# Patient Record
Sex: Female | Born: 1935
Health system: Southern US, Community
[De-identification: ages and names within clinical notes are randomized; demographics above are authoritative.]

## PROBLEM LIST (undated history)

## (undated) DIAGNOSIS — M810 Age-related osteoporosis without current pathological fracture: Secondary | ICD-10-CM

## (undated) DIAGNOSIS — K219 Gastro-esophageal reflux disease without esophagitis: Secondary | ICD-10-CM

## (undated) DIAGNOSIS — E039 Hypothyroidism, unspecified: Secondary | ICD-10-CM

## (undated) DIAGNOSIS — C801 Malignant (primary) neoplasm, unspecified: Secondary | ICD-10-CM

## (undated) DIAGNOSIS — M353 Polymyalgia rheumatica: Secondary | ICD-10-CM

## (undated) DIAGNOSIS — M1990 Unspecified osteoarthritis, unspecified site: Secondary | ICD-10-CM

## (undated) DIAGNOSIS — G4730 Sleep apnea, unspecified: Secondary | ICD-10-CM

## (undated) DIAGNOSIS — I10 Essential (primary) hypertension: Secondary | ICD-10-CM

## (undated) DIAGNOSIS — E079 Disorder of thyroid, unspecified: Secondary | ICD-10-CM

## (undated) HISTORY — PX: TUBAL LIGATION: SHX77

## (undated) HISTORY — PX: LAMINECTOMY: SHX219

## (undated) HISTORY — PX: TONSILLECTOMY: SUR1361

## (undated) HISTORY — PX: DILATION AND CURETTAGE OF UTERUS: SHX78

---

## 1998-01-17 ENCOUNTER — Other Ambulatory Visit: Admission: RE | Admit: 1998-01-17 | Discharge: 1998-01-17 | Payer: Self-pay | Admitting: *Deleted

## 1999-02-18 ENCOUNTER — Encounter: Admission: RE | Admit: 1999-02-18 | Discharge: 1999-02-18 | Payer: Self-pay | Admitting: Internal Medicine

## 1999-03-06 ENCOUNTER — Encounter: Payer: Self-pay | Admitting: Internal Medicine

## 1999-03-06 ENCOUNTER — Encounter: Admission: RE | Admit: 1999-03-06 | Discharge: 1999-03-06 | Payer: Self-pay | Admitting: Internal Medicine

## 1999-03-18 ENCOUNTER — Ambulatory Visit (HOSPITAL_COMMUNITY): Admission: RE | Admit: 1999-03-18 | Discharge: 1999-03-18 | Payer: Self-pay | Admitting: Internal Medicine

## 1999-03-19 ENCOUNTER — Encounter: Payer: Self-pay | Admitting: Internal Medicine

## 1999-04-01 ENCOUNTER — Ambulatory Visit (HOSPITAL_COMMUNITY): Admission: RE | Admit: 1999-04-01 | Discharge: 1999-04-01 | Payer: Self-pay | Admitting: Internal Medicine

## 1999-04-01 ENCOUNTER — Encounter (INDEPENDENT_AMBULATORY_CARE_PROVIDER_SITE_OTHER): Payer: Self-pay | Admitting: *Deleted

## 1999-04-12 ENCOUNTER — Encounter: Payer: Self-pay | Admitting: Internal Medicine

## 1999-04-12 ENCOUNTER — Encounter: Admission: RE | Admit: 1999-04-12 | Discharge: 1999-04-12 | Payer: Self-pay | Admitting: Internal Medicine

## 1999-05-13 HISTORY — PX: THYROIDECTOMY: SHX17

## 1999-05-22 ENCOUNTER — Encounter: Payer: Self-pay | Admitting: Surgery

## 1999-05-23 ENCOUNTER — Encounter (INDEPENDENT_AMBULATORY_CARE_PROVIDER_SITE_OTHER): Payer: Self-pay

## 1999-05-24 ENCOUNTER — Inpatient Hospital Stay (HOSPITAL_COMMUNITY): Admission: AD | Admit: 1999-05-24 | Discharge: 1999-05-25 | Payer: Self-pay | Admitting: Surgery

## 1999-08-12 ENCOUNTER — Ambulatory Visit (HOSPITAL_COMMUNITY): Admission: RE | Admit: 1999-08-12 | Discharge: 1999-08-12 | Payer: Self-pay | Admitting: Endocrinology

## 1999-08-16 ENCOUNTER — Encounter: Payer: Self-pay | Admitting: Endocrinology

## 1999-08-19 ENCOUNTER — Ambulatory Visit: Admission: RE | Admit: 1999-08-19 | Discharge: 1999-08-19 | Payer: Self-pay | Admitting: Endocrinology

## 1999-08-19 ENCOUNTER — Encounter: Payer: Self-pay | Admitting: Endocrinology

## 2000-02-20 ENCOUNTER — Encounter: Payer: Self-pay | Admitting: Internal Medicine

## 2000-02-20 ENCOUNTER — Encounter: Admission: RE | Admit: 2000-02-20 | Discharge: 2000-02-20 | Payer: Self-pay | Admitting: Internal Medicine

## 2001-02-17 ENCOUNTER — Other Ambulatory Visit: Admission: RE | Admit: 2001-02-17 | Discharge: 2001-02-17 | Payer: Self-pay | Admitting: *Deleted

## 2001-02-22 ENCOUNTER — Encounter: Payer: Self-pay | Admitting: Internal Medicine

## 2001-02-22 ENCOUNTER — Encounter: Admission: RE | Admit: 2001-02-22 | Discharge: 2001-02-22 | Payer: Self-pay | Admitting: Internal Medicine

## 2001-05-14 ENCOUNTER — Encounter: Payer: Self-pay | Admitting: Internal Medicine

## 2001-05-14 ENCOUNTER — Encounter: Admission: RE | Admit: 2001-05-14 | Discharge: 2001-05-14 | Payer: Self-pay | Admitting: Internal Medicine

## 2002-02-24 ENCOUNTER — Encounter: Payer: Self-pay | Admitting: Internal Medicine

## 2002-02-24 ENCOUNTER — Encounter: Admission: RE | Admit: 2002-02-24 | Discharge: 2002-02-24 | Payer: Self-pay | Admitting: Internal Medicine

## 2002-09-15 ENCOUNTER — Other Ambulatory Visit: Admission: RE | Admit: 2002-09-15 | Discharge: 2002-09-15 | Payer: Self-pay | Admitting: Obstetrics and Gynecology

## 2003-03-21 ENCOUNTER — Encounter: Admission: RE | Admit: 2003-03-21 | Discharge: 2003-03-21 | Payer: Self-pay | Admitting: Internal Medicine

## 2003-04-26 ENCOUNTER — Encounter: Admission: RE | Admit: 2003-04-26 | Discharge: 2003-04-26 | Payer: Self-pay | Admitting: Internal Medicine

## 2004-03-25 ENCOUNTER — Encounter (HOSPITAL_COMMUNITY): Admission: RE | Admit: 2004-03-25 | Discharge: 2004-06-23 | Payer: Self-pay | Admitting: Endocrinology

## 2004-04-09 ENCOUNTER — Encounter: Admission: RE | Admit: 2004-04-09 | Discharge: 2004-04-09 | Payer: Self-pay | Admitting: Internal Medicine

## 2004-10-04 ENCOUNTER — Encounter: Admission: RE | Admit: 2004-10-04 | Discharge: 2004-10-04 | Payer: Self-pay | Admitting: Neurology

## 2005-01-10 ENCOUNTER — Encounter: Admission: RE | Admit: 2005-01-10 | Discharge: 2005-01-10 | Payer: Self-pay | Admitting: Neurology

## 2005-01-17 ENCOUNTER — Encounter: Admission: RE | Admit: 2005-01-17 | Discharge: 2005-01-17 | Payer: Self-pay | Admitting: Neurology

## 2005-01-31 ENCOUNTER — Encounter: Admission: RE | Admit: 2005-01-31 | Discharge: 2005-01-31 | Payer: Self-pay | Admitting: Neurology

## 2005-04-10 ENCOUNTER — Encounter: Admission: RE | Admit: 2005-04-10 | Discharge: 2005-04-10 | Payer: Self-pay | Admitting: Internal Medicine

## 2005-05-12 HISTORY — PX: EYE SURGERY: SHX253

## 2006-04-13 ENCOUNTER — Encounter: Admission: RE | Admit: 2006-04-13 | Discharge: 2006-04-13 | Payer: Self-pay | Admitting: Internal Medicine

## 2007-03-01 ENCOUNTER — Encounter: Admission: RE | Admit: 2007-03-01 | Discharge: 2007-03-01 | Payer: Self-pay | Admitting: Internal Medicine

## 2007-04-15 ENCOUNTER — Encounter: Admission: RE | Admit: 2007-04-15 | Discharge: 2007-04-15 | Payer: Self-pay | Admitting: Internal Medicine

## 2007-04-27 ENCOUNTER — Encounter: Admission: RE | Admit: 2007-04-27 | Discharge: 2007-04-27 | Payer: Self-pay | Admitting: Internal Medicine

## 2008-04-18 ENCOUNTER — Encounter: Admission: RE | Admit: 2008-04-18 | Discharge: 2008-04-18 | Payer: Self-pay | Admitting: Internal Medicine

## 2009-03-02 ENCOUNTER — Encounter: Admission: RE | Admit: 2009-03-02 | Discharge: 2009-03-02 | Payer: Self-pay | Admitting: Internal Medicine

## 2009-04-24 ENCOUNTER — Encounter: Admission: RE | Admit: 2009-04-24 | Discharge: 2009-04-24 | Payer: Self-pay | Admitting: Internal Medicine

## 2010-04-29 ENCOUNTER — Encounter
Admission: RE | Admit: 2010-04-29 | Discharge: 2010-04-29 | Payer: Self-pay | Source: Home / Self Care | Attending: Internal Medicine | Admitting: Internal Medicine

## 2010-09-27 NOTE — Op Note (Signed)
. [facility]  Patient:    Lorraine Brandt                        MRN: [id number] Proc. Date: 05/23/99 Adm. Date:  [id number] Attending:  Charlton Haws                           Operative Report  PREOPERATIVE DIAGNOSIS:  Postoperative wound hematoma.  POSTOPERATIVE DIAGNOSIS:  Postoperative wound hematoma.  OPERATION:  Exploration of wound with evacuation of hematoma.  SURGEON:  Currie Paris, M.D.  ASSISTANT:  Abigail Miyamoto, M.D.  ANESTHESIA:  General endotracheal.  CLINICAL HISTORY:  This patient is a 77 year old who underwent a left thyroid lobectomy earlier today for a ______ neoplasm of the thyroid.  The patient did well through surgery and was initially stable postoperatively, having been seen on postoperative rounds by Dr. Magnus Ivan about 2:30 with no problems. Approximately 4:15, she had a coughing spell and then noted throat tightness and some bleeding from the incision with significant neck swelling.  She felt a little dyspneic, nd it was felt that she was developing a wound hematoma.  She was therefore brought back to the operating room for emergent wound exploration.  DESCRIPTION OF PROCEDURE:  The patient was brought to the operating room directly and satisfactory general endotracheal anesthesia was obtained.  The neck was prepped and draped as a sterile field.  The skin staples had been removed, but he sutures on the platysma were then divided.  There was a small amount of blood in the subcutaneous subplatysmal pocket, and this was evacuated.  This area was irrigated copiously, and there was nothing bleeding.  There was, however, some bulging from beneath the straps.  The closure of the midline straps was removed, and there was about a 20 cc clot in the pocket left from the thyroidectomy on the left side.  This was evacuated and copiously irrigated.  There was one small oozing point on the left side near  the midline.  This was just slightly oozing, and it was cauterized.  I irrigated again, and I saw nothing else actively bleeding.  I placed some Surgicel in the wound and a pack and waited 10 minutes by the clock.  The packing and Surgicel were removed.  There was virtually no blood on the Surgicel other than where it had been directly in contact with some blood left behind prior to the irrigation.  Again, the wound was copiously irrigated and observed for approximately five minutes by the clock.  There was no active bleeding noted.  smaller piece of Surgicel was left lying along the raw surfaces, and we watched it for another five minutes to be sure that there was no bleeding.  During this period of time, I made a small incision in the midline, below the lower flap, and I placed a 10 mm JP drain through this.  Once everything appeared to be dry, the drain was placed, and the strap muscles were re-closed using 3-0 Vicryl.  The subcutaneous pocket was, again, checked for hemostasis, and again, it appeared to be dry. It was closed, again, with 3-0 Vicryl.  The skin was closed with a few staples and  then some Steri-Strips.  The drain was secured with a 3-0 nylon.  The patient tolerated the procedure well.  Estimated blood loss was approximately 35 cc including the evacuation of the hematoma.  No  complications during the procedure, and she tolerated it well.  All counts were correct. DD:  05/23/99 TD:  05/23/99 Job: [id number]

## 2010-09-27 NOTE — Discharge Summary (Signed)
[place]  Patient:    Lorraine Brandt                        MRN: [id number] Adm. Date:  [id number] Disc. Date: [id number] Attending:  Charlton Haws                           Discharge Summary  FINAL DIAGNOSIS:  Well-differentiated papillary adenocarcinoma, follicular variant, left thyroid lobe with associated chronic lymphocytic thyroiditis.  CLINICAL HISTORY:  This patient is a 77 year old who had a solid left thyroid nodule and aspiration suggested this was not a goiter but a follicular nodule. She was therefore scheduled for a thyroid lobectomy.  HOSPITAL COURSE:  The patient was admitted and taken to the operating room where a left thyroid lobectomy was accomplished.  Frozen section showed this to be a follicular lesion but could not be determined whether it was malignant or benign. She tolerated the procedure well.  On initial postoperative visit, she seemed to be doing fine but shortly thereafter had a significant coughing episode, developed  increased respiratory difficulty with some bleeding on the dressing and swelling of the neck.  She was returned to the operating room, where a wound hematoma was found and evacuated and a drain left.  She tolerated that nicely and that evening was  doing fine.  The drain was left in for two days and she was then discharged. She felt well.  She was tolerating a diet.  Her voice seemed to be okay and she was  doing quite well.  Her drain was removed prior to discharge.  DISPOSITION:  She was discharged in satisfactory condition.  ACTIVITIES:  Limited activities.  DIET:  Usual diet.  FOLLOW-UP:  To be followed in my office in several days.  LABORATORY DATA:  At the time of discharge, her pathology was still pending. This subsequently returned showing well-differentiated papillary adenocarcinoma, follicular variant. DD:  06/20/99 TD:  06/20/99 Job: [id number]

## 2010-09-27 NOTE — Op Note (Signed)
[place]  Patient:    Lorraine Brandt                        MRN: [id number] Proc. Date: 05/23/99 Adm. Date:  [id number] Attending:  Charlton Haws CC:         Modesta Messing, M.D.                           Operative Report  Account [id number]  PREOPERATIVE DIAGNOSIS:  Left thyroid nodule.  POSTOPERATIVE DIAGNOSIS:  Left thyroid nodule - follicular lesion.  OPERATION:  Left thyroid lobectomy.  SURGEON:  Currie Paris, M.D.  ASSISTANT:  Anselm Pancoast. Zachery Dakins, M.D.  ANESTHESIA:  General endotracheal.  CLINICAL HISTORY:  This patient is a 77 year old recently found on routine physical to have a left thyroid lobe nodule and on biopsy had some follicular cells suggestive of follicular neoplasm.  DESCRIPTION OF PROCEDURE:  The patient was brought to the operating room and after satisfactory general endotracheal anesthesia having been obtained, the neck was  prepped and draped.  A curvilinear incision was outlined about one fingerbreadth above the clavicle and made deep in through the platysma.  Supple platysmal flaps were raised.  Self-retaining retractors were placed and the midline fascia opened. The thyroid was identified and was fairly small, but there was a nodular density in the left lobe.  The left lobe was retracted medially and the nerve identified.  Once that had been identified, divided, and the vessels to the superior pole ligating doubly with 2-0 silk.  Then I divided the vessels to the inferior pole  staying very close into the surgical plane of the thyroid.  Both the superior and inferior parathyroids were identified and preserved by staying on the thyroid side and peeling them off of the thyroid and dropping them back.  As the thyroid was  rotated further medially, I was able to divide the remaining attachments and then at this point decided to divide the midline.  I made a small elevation of the  thyroid off of the trachea and divided it between clamps and ligated with 3-0 Vicryl pop-offs.  The remaining attachments of the thyroid near where the nerve entered were divided carefully taking care to stay  away from the nerve.  The thyroid was sent for frozen section which subsequently returned follicular tumor.  There was some question of some papillary variant, ut no diagnosis of papillary variant and follicular neoplasm could be made, so I elected to terminate the case.  The wound was checked for hemostasis and then closed with 3-0 Vicryl to close the midline, 3-0 Vicryl in the platysma, and staples and Steri-Strips on the skin.  The patient tolerated the procedure well.  There were no intraoperative complications.  All counts were correct.DD:  05/23/99 TD:  05/23/99 Job: [id number]

## 2011-01-29 ENCOUNTER — Other Ambulatory Visit: Payer: Self-pay | Admitting: Internal Medicine

## 2011-01-29 DIAGNOSIS — Z1231 Encounter for screening mammogram for malignant neoplasm of breast: Secondary | ICD-10-CM

## 2011-03-05 ENCOUNTER — Ambulatory Visit
Admission: RE | Admit: 2011-03-05 | Discharge: 2011-03-05 | Disposition: A | Discharge status: Home / Self Care | Payer: Medicare Other | Source: Ambulatory Visit | Attending: Internal Medicine | Admitting: Internal Medicine

## 2011-05-01 ENCOUNTER — Ambulatory Visit
Admission: RE | Admit: 2011-05-01 | Discharge: 2011-05-01 | Disposition: A | Discharge status: Home / Self Care | Payer: Medicare Other | Source: Ambulatory Visit | Attending: Internal Medicine | Admitting: Internal Medicine

## 2011-05-01 DIAGNOSIS — Z1231 Encounter for screening mammogram for malignant neoplasm of breast: Secondary | ICD-10-CM

## 2011-06-23 ENCOUNTER — Emergency Department (INDEPENDENT_AMBULATORY_CARE_PROVIDER_SITE_OTHER): Payer: Medicare Other

## 2011-06-23 ENCOUNTER — Emergency Department (HOSPITAL_BASED_OUTPATIENT_CLINIC_OR_DEPARTMENT_OTHER)
Admission: EM | Admit: 2011-06-23 | Discharge: 2011-06-23 | Disposition: A | Discharge status: Home / Self Care | Payer: Medicare Other | Attending: Emergency Medicine | Admitting: Emergency Medicine

## 2011-06-23 ENCOUNTER — Other Ambulatory Visit: Payer: Self-pay

## 2011-06-23 ENCOUNTER — Encounter (HOSPITAL_BASED_OUTPATIENT_CLINIC_OR_DEPARTMENT_OTHER): Payer: Self-pay | Admitting: Family Medicine

## 2011-06-23 DIAGNOSIS — E871 Hypo-osmolality and hyponatremia: Secondary | ICD-10-CM | POA: Diagnosis not present

## 2011-06-23 DIAGNOSIS — I10 Essential (primary) hypertension: Secondary | ICD-10-CM | POA: Diagnosis not present

## 2011-06-23 DIAGNOSIS — R55 Syncope and collapse: Secondary | ICD-10-CM

## 2011-06-23 DIAGNOSIS — J449 Chronic obstructive pulmonary disease, unspecified: Secondary | ICD-10-CM

## 2011-06-23 DIAGNOSIS — J4489 Other specified chronic obstructive pulmonary disease: Secondary | ICD-10-CM

## 2011-06-23 DIAGNOSIS — E079 Disorder of thyroid, unspecified: Secondary | ICD-10-CM | POA: Insufficient documentation

## 2011-06-23 HISTORY — DX: Disorder of thyroid, unspecified: E07.9

## 2011-06-23 HISTORY — DX: Essential (primary) hypertension: I10

## 2011-06-23 HISTORY — DX: Polymyalgia rheumatica: M35.3

## 2011-06-23 HISTORY — DX: Unspecified osteoarthritis, unspecified site: M19.90

## 2011-06-23 LAB — CBC
HCT: 39.7 % (ref 36.0–46.0)
MCH: 32.3 pg (ref 26.0–34.0)
MCHC: 36.3 g/dL — ABNORMAL HIGH (ref 30.0–36.0)
MCV: 89 fL (ref 78.0–100.0)
RDW: 12.9 % (ref 11.5–15.5)

## 2011-06-23 LAB — DIFFERENTIAL
Basophils Absolute: 0 10*3/uL (ref 0.0–0.1)
Basophils Relative: 0 % (ref 0–1)
Eosinophils Absolute: 0.2 10*3/uL (ref 0.0–0.7)
Eosinophils Relative: 2 % (ref 0–5)
Monocytes Absolute: 0.8 10*3/uL (ref 0.1–1.0)

## 2011-06-23 LAB — BASIC METABOLIC PANEL
CO2: 27 mEq/L (ref 19–32)
Calcium: 9.6 mg/dL (ref 8.4–10.5)
Creatinine, Ser: 0.6 mg/dL (ref 0.50–1.10)
GFR calc Af Amer: >90 mL/min (ref >90)
GFR calc non Af Amer: 87 mL/min — ABNORMAL LOW (ref >90)

## 2011-06-23 LAB — URINALYSIS, ROUTINE W REFLEX MICROSCOPIC
Glucose, UA: NEGATIVE mg/dL
Ketones, ur: NEGATIVE mg/dL
Leukocytes, UA: NEGATIVE
Protein, ur: NEGATIVE mg/dL

## 2011-06-23 MED ORDER — SODIUM CHLORIDE 0.9 % IV BOLUS (SEPSIS)
500.0000 mL | Freq: Once | INTRAVENOUS | Status: AC
  Administered 2011-06-23: 500 mL via INTRAVENOUS

## 2011-06-23 NOTE — ED Provider Notes (Signed)
History     CSN: [id number]  Arrival date & time 06/23/11  1010   First MD Initiated Contact with Patient 06/23/11 1021      Chief Complaint  Patient presents with  . Loss of Consciousness    (Consider location/radiation/quality/duration/timing/severity/associated sxs/prior treatment) The history is provided by the patient.   patient was at home, she bent over and felt lightheaded and passed out. She fell down and reportedly grabbed onto a table on a printer fell off and hit her head on the way down. No numbness weakness. She states her heart felt like it was beating fast after the event. She says that she's had a cold recently. She's felt a little weak overall. The chest pain. No nausea vomiting diarrhea. She states she's felt as if she had had fevers. No diarrhea. She has a history of hypertension. No chest pain. No numbness or weakness. No headache.  Past Medical History  Diagnosis Date  . Hypertension   . Thyroid disease   . Polymyalgia rheumatica   . Arthritis     Past Surgical History  Procedure Date  . Laminectomy   . Thyroidectomy   . Tonsillectomy   . Eye surgery     No family history on file.  History  Substance Use Topics  . Smoking status: Never Smoker   . Smokeless tobacco: Not on file  . Alcohol Use: 0.6 oz/week    1 Glasses of wine per week    OB History    Grav Para Term Preterm Abortions TAB SAB Ect Mult Living                  Review of Systems  Constitutional: Negative for activity change and appetite change.  HENT: Negative for neck stiffness.   Eyes: Negative for pain.  Respiratory: Negative for chest tightness and shortness of breath.   Cardiovascular: Negative for chest pain and leg swelling.  Gastrointestinal: Negative for nausea, vomiting, abdominal pain and diarrhea.  Genitourinary: Negative for flank pain.  Musculoskeletal: Negative for back pain.  Skin: Negative for rash.  Neurological: Positive for syncope. Negative for  weakness, numbness and headaches.  Psychiatric/Behavioral: Negative for behavioral problems.    Allergies  Review of patient's allergies indicates no known allergies.  Home Medications   Current Outpatient Rx  Name Route Sig Dispense Refill  . DILTIAZEM HCL ER COATED BEADS 300 MG PO CP24 Oral Take 300 mg by mouth daily.    Marland Kitchen HYDROCHLOROTHIAZIDE 25 MG PO TABS Oral Take 25 mg by mouth daily.    Marland Kitchen LEVOTHYROXINE SODIUM 125 MCG PO TABS Oral Take 125 mcg by mouth daily.    Marland Kitchen OMEPRAZOLE 20 MG PO CPDR Oral Take 20 mg by mouth daily.    Marland Kitchen PREDNISONE 1 MG PO TABS Oral Take 1 mg by mouth 2 (two) times daily.      BP 152/75  Pulse 73  Temp(Src) 98 F (36.7 C) (Oral)  Resp 16  Ht 5' 2.5" (1.588 m)  Wt 128 lb (58.06 kg)  BMI 23.04 kg/m2  SpO2 95%  Physical Exam  Nursing note and vitals reviewed. Constitutional: She is oriented to person, place, and time. She appears well-developed and well-nourished.  HENT:  Head: Normocephalic.       Minimal redness and tenderness to right temporal area  Eyes: EOM are normal. Pupils are equal, round, and reactive to light.  Neck: Normal range of motion. Neck supple.  Cardiovascular: Normal rate, regular rhythm and normal heart sounds.  No murmur heard. Pulmonary/Chest: Effort normal and breath sounds normal. No respiratory distress. She has no wheezes. She has no rales.  Abdominal: Soft. Bowel sounds are normal. She exhibits no distension. There is no tenderness. There is no rebound and no guarding.  Musculoskeletal: Normal range of motion.  Neurological: She is alert and oriented to person, place, and time. No cranial nerve deficit.  Skin: Skin is warm and dry.  Psychiatric: She has a normal mood and affect. Her speech is normal.    ED Course  Procedures (including critical care time)  Labs Reviewed  CBC - Abnormal; Notable for the following:    MCHC 36.3 (*)    All other components within normal limits  BASIC METABOLIC PANEL - Abnormal;  Notable for the following:    Sodium 129 (*)    Chloride 93 (*)    Glucose, Bld 106 (*)    GFR calc non Af Amer 87 (*)    All other components within normal limits  DIFFERENTIAL  URINALYSIS, ROUTINE W REFLEX MICROSCOPIC  TROPONIN I   Dg Chest 2 View  06/23/2011  *RADIOLOGY REPORT*  Clinical Data: Syncopal episode this morning.  CHEST - 2 VIEW  Comparison: 07/06/2009.  Findings: Normal sized heart.  Clear lungs.  The lungs are hyperexpanded with mildly prominent interstitial markings.  Diffuse osteopenia.  IMPRESSION: COPD.  No acute abnormality.  Original Report Authenticated By: Darrol Angel, M.D.     1. Syncope   2. Hyponatremia     Date: 06/23/2011  Rate: 77  Rhythm: normal sinus rhythm  QRS Axis: normal  Intervals: normal  ST/T Wave abnormalities: normal  Conduction Disutrbances:none  Narrative Interpretation:   Old EKG Reviewed: unchanged     MDM  Syncope while bending over. EKG is reassuring. Lab work shows a mild hyponatremia. Doubt severe cardiac cause. The prednisone and Synthroid she also had some importance to this. She feels better and is angulated. She's given IV fluid to help with hyponatremia. She'll followup with her primary care Dr.        Juliet Rude. Rubin Payor, MD 06/23/11 1258

## 2011-06-23 NOTE — ED Notes (Signed)
Pt sts she bent over this morning, became dizzy and passed out "briefly". Pt sts her printer fell hitting her head. Pt denies chest pain, dizziness and shob. Pt denies h/o dizziness.

## 2011-06-25 DIAGNOSIS — M1710 Unilateral primary osteoarthritis, unspecified knee: Secondary | ICD-10-CM | POA: Diagnosis not present

## 2011-06-30 DIAGNOSIS — E871 Hypo-osmolality and hyponatremia: Secondary | ICD-10-CM | POA: Diagnosis not present

## 2011-08-22 DIAGNOSIS — M1710 Unilateral primary osteoarthritis, unspecified knee: Secondary | ICD-10-CM | POA: Diagnosis not present

## 2011-08-26 DIAGNOSIS — K219 Gastro-esophageal reflux disease without esophagitis: Secondary | ICD-10-CM | POA: Diagnosis not present

## 2011-08-26 DIAGNOSIS — M810 Age-related osteoporosis without current pathological fracture: Secondary | ICD-10-CM | POA: Diagnosis not present

## 2011-08-26 DIAGNOSIS — I10 Essential (primary) hypertension: Secondary | ICD-10-CM | POA: Diagnosis not present

## 2011-08-26 DIAGNOSIS — M2550 Pain in unspecified joint: Secondary | ICD-10-CM | POA: Diagnosis not present

## 2011-09-19 DIAGNOSIS — H04129 Dry eye syndrome of unspecified lacrimal gland: Secondary | ICD-10-CM | POA: Diagnosis not present

## 2011-09-19 DIAGNOSIS — H26499 Other secondary cataract, unspecified eye: Secondary | ICD-10-CM | POA: Diagnosis not present

## 2011-10-20 DIAGNOSIS — M5030 Other cervical disc degeneration, unspecified cervical region: Secondary | ICD-10-CM | POA: Diagnosis not present

## 2011-11-03 DIAGNOSIS — B370 Candidal stomatitis: Secondary | ICD-10-CM | POA: Diagnosis not present

## 2011-11-12 DIAGNOSIS — M5030 Other cervical disc degeneration, unspecified cervical region: Secondary | ICD-10-CM | POA: Diagnosis not present

## 2011-11-18 DIAGNOSIS — M5030 Other cervical disc degeneration, unspecified cervical region: Secondary | ICD-10-CM | POA: Diagnosis not present

## 2011-12-01 DIAGNOSIS — Z124 Encounter for screening for malignant neoplasm of cervix: Secondary | ICD-10-CM | POA: Diagnosis not present

## 2011-12-01 DIAGNOSIS — Z01419 Encounter for gynecological examination (general) (routine) without abnormal findings: Secondary | ICD-10-CM | POA: Diagnosis not present

## 2012-03-04 DIAGNOSIS — M810 Age-related osteoporosis without current pathological fracture: Secondary | ICD-10-CM | POA: Diagnosis not present

## 2012-03-04 DIAGNOSIS — I10 Essential (primary) hypertension: Secondary | ICD-10-CM | POA: Diagnosis not present

## 2012-03-04 DIAGNOSIS — Z1331 Encounter for screening for depression: Secondary | ICD-10-CM | POA: Diagnosis not present

## 2012-03-04 DIAGNOSIS — Z0000 Encounter for general adult medical examination without abnormal findings: Secondary | ICD-10-CM | POA: Diagnosis not present

## 2012-03-09 DIAGNOSIS — C73 Malignant neoplasm of thyroid gland: Secondary | ICD-10-CM | POA: Diagnosis not present

## 2012-03-09 DIAGNOSIS — M353 Polymyalgia rheumatica: Secondary | ICD-10-CM | POA: Diagnosis not present

## 2012-03-09 DIAGNOSIS — G4733 Obstructive sleep apnea (adult) (pediatric): Secondary | ICD-10-CM | POA: Diagnosis not present

## 2012-03-09 DIAGNOSIS — Z1331 Encounter for screening for depression: Secondary | ICD-10-CM | POA: Diagnosis not present

## 2012-03-10 ENCOUNTER — Other Ambulatory Visit: Payer: Self-pay | Admitting: Internal Medicine

## 2012-03-10 DIAGNOSIS — Z1231 Encounter for screening mammogram for malignant neoplasm of breast: Secondary | ICD-10-CM

## 2012-03-11 ENCOUNTER — Other Ambulatory Visit: Payer: Self-pay | Admitting: Dermatology

## 2012-03-11 DIAGNOSIS — L821 Other seborrheic keratosis: Secondary | ICD-10-CM | POA: Diagnosis not present

## 2012-03-11 DIAGNOSIS — D1801 Hemangioma of skin and subcutaneous tissue: Secondary | ICD-10-CM | POA: Diagnosis not present

## 2012-03-11 DIAGNOSIS — D239 Other benign neoplasm of skin, unspecified: Secondary | ICD-10-CM | POA: Diagnosis not present

## 2012-03-11 DIAGNOSIS — Z85828 Personal history of other malignant neoplasm of skin: Secondary | ICD-10-CM | POA: Diagnosis not present

## 2012-03-11 DIAGNOSIS — D2360 Other benign neoplasm of skin of unspecified upper limb, including shoulder: Secondary | ICD-10-CM | POA: Diagnosis not present

## 2012-03-11 DIAGNOSIS — L723 Sebaceous cyst: Secondary | ICD-10-CM | POA: Diagnosis not present

## 2012-03-11 DIAGNOSIS — L739 Follicular disorder, unspecified: Secondary | ICD-10-CM | POA: Diagnosis not present

## 2012-05-03 ENCOUNTER — Ambulatory Visit
Admission: RE | Admit: 2012-05-03 | Discharge: 2012-05-03 | Disposition: A | Discharge status: Home / Self Care | Payer: Medicare Other | Source: Ambulatory Visit | Attending: Internal Medicine | Admitting: Internal Medicine

## 2012-05-03 DIAGNOSIS — Z1231 Encounter for screening mammogram for malignant neoplasm of breast: Secondary | ICD-10-CM

## 2012-09-02 ENCOUNTER — Other Ambulatory Visit: Payer: Self-pay | Admitting: Internal Medicine

## 2012-09-02 DIAGNOSIS — M48061 Spinal stenosis, lumbar region without neurogenic claudication: Secondary | ICD-10-CM

## 2012-09-02 DIAGNOSIS — M545 Low back pain, unspecified: Secondary | ICD-10-CM | POA: Diagnosis not present

## 2012-09-02 DIAGNOSIS — I10 Essential (primary) hypertension: Secondary | ICD-10-CM | POA: Diagnosis not present

## 2012-09-02 DIAGNOSIS — M353 Polymyalgia rheumatica: Secondary | ICD-10-CM | POA: Diagnosis not present

## 2012-09-02 DIAGNOSIS — G4733 Obstructive sleep apnea (adult) (pediatric): Secondary | ICD-10-CM | POA: Diagnosis not present

## 2012-09-03 ENCOUNTER — Ambulatory Visit
Admission: RE | Admit: 2012-09-03 | Discharge: 2012-09-03 | Disposition: A | Discharge status: Home / Self Care | Payer: Medicare Other | Source: Ambulatory Visit | Attending: Internal Medicine | Admitting: Internal Medicine

## 2012-09-03 DIAGNOSIS — M48061 Spinal stenosis, lumbar region without neurogenic claudication: Secondary | ICD-10-CM | POA: Diagnosis not present

## 2012-09-03 DIAGNOSIS — M4310 Spondylolisthesis, site unspecified: Secondary | ICD-10-CM | POA: Diagnosis not present

## 2012-09-03 DIAGNOSIS — M47817 Spondylosis without myelopathy or radiculopathy, lumbosacral region: Secondary | ICD-10-CM | POA: Diagnosis not present

## 2012-09-03 DIAGNOSIS — M5126 Other intervertebral disc displacement, lumbar region: Secondary | ICD-10-CM | POA: Diagnosis not present

## 2012-09-03 MED ORDER — GADOBENATE DIMEGLUMINE 529 MG/ML IV SOLN
12.0000 mL | Freq: Once | INTRAVENOUS | Status: AC | PRN
  Administered 2012-09-03: 12 mL via INTRAVENOUS

## 2012-09-30 DIAGNOSIS — M545 Low back pain: Secondary | ICD-10-CM | POA: Diagnosis not present

## 2012-09-30 DIAGNOSIS — M48061 Spinal stenosis, lumbar region without neurogenic claudication: Secondary | ICD-10-CM | POA: Diagnosis not present

## 2012-10-01 DIAGNOSIS — Z961 Presence of intraocular lens: Secondary | ICD-10-CM | POA: Diagnosis not present

## 2012-10-01 DIAGNOSIS — H04129 Dry eye syndrome of unspecified lacrimal gland: Secondary | ICD-10-CM | POA: Diagnosis not present

## 2012-10-01 DIAGNOSIS — H26499 Other secondary cataract, unspecified eye: Secondary | ICD-10-CM | POA: Diagnosis not present

## 2012-10-18 DIAGNOSIS — M545 Low back pain: Secondary | ICD-10-CM | POA: Diagnosis not present

## 2012-10-21 ENCOUNTER — Other Ambulatory Visit: Payer: Self-pay | Admitting: Neurosurgery

## 2012-10-21 DIAGNOSIS — M545 Low back pain: Secondary | ICD-10-CM

## 2012-10-25 ENCOUNTER — Ambulatory Visit
Admission: RE | Admit: 2012-10-25 | Discharge: 2012-10-25 | Disposition: A | Discharge status: Home / Self Care | Payer: Medicare Other | Source: Ambulatory Visit | Attending: Neurosurgery | Admitting: Neurosurgery

## 2012-10-25 DIAGNOSIS — M545 Low back pain: Secondary | ICD-10-CM

## 2012-10-25 DIAGNOSIS — M47817 Spondylosis without myelopathy or radiculopathy, lumbosacral region: Secondary | ICD-10-CM | POA: Diagnosis not present

## 2012-10-25 MED ORDER — METHYLPREDNISOLONE ACETATE 40 MG/ML INJ SUSP (RADIOLOG
120.0000 mg | Freq: Once | INTRAMUSCULAR | Status: AC
  Administered 2012-10-25: 120 mg via EPIDURAL

## 2012-10-25 MED ORDER — IOHEXOL 180 MG/ML  SOLN
1.0000 mL | Freq: Once | INTRAMUSCULAR | Status: AC | PRN
  Administered 2012-10-25: 1 mL via EPIDURAL

## 2012-11-22 DIAGNOSIS — M545 Low back pain: Secondary | ICD-10-CM | POA: Diagnosis not present

## 2013-01-07 ENCOUNTER — Other Ambulatory Visit: Payer: Self-pay | Admitting: Neurosurgery

## 2013-01-07 DIAGNOSIS — M545 Low back pain: Secondary | ICD-10-CM

## 2013-01-17 DIAGNOSIS — M545 Low back pain: Secondary | ICD-10-CM | POA: Diagnosis not present

## 2013-03-02 DIAGNOSIS — Z23 Encounter for immunization: Secondary | ICD-10-CM | POA: Diagnosis not present

## 2013-03-07 ENCOUNTER — Other Ambulatory Visit: Payer: Self-pay | Admitting: Internal Medicine

## 2013-03-07 DIAGNOSIS — M810 Age-related osteoporosis without current pathological fracture: Secondary | ICD-10-CM | POA: Diagnosis not present

## 2013-03-07 DIAGNOSIS — Z0000 Encounter for general adult medical examination without abnormal findings: Secondary | ICD-10-CM | POA: Diagnosis not present

## 2013-03-07 DIAGNOSIS — Z1331 Encounter for screening for depression: Secondary | ICD-10-CM | POA: Diagnosis not present

## 2013-03-07 DIAGNOSIS — M353 Polymyalgia rheumatica: Secondary | ICD-10-CM | POA: Diagnosis not present

## 2013-03-07 DIAGNOSIS — I10 Essential (primary) hypertension: Secondary | ICD-10-CM | POA: Diagnosis not present

## 2013-03-11 DIAGNOSIS — L723 Sebaceous cyst: Secondary | ICD-10-CM | POA: Diagnosis not present

## 2013-03-11 DIAGNOSIS — Z85828 Personal history of other malignant neoplasm of skin: Secondary | ICD-10-CM | POA: Diagnosis not present

## 2013-03-11 DIAGNOSIS — L819 Disorder of pigmentation, unspecified: Secondary | ICD-10-CM | POA: Diagnosis not present

## 2013-03-11 DIAGNOSIS — D485 Neoplasm of uncertain behavior of skin: Secondary | ICD-10-CM | POA: Diagnosis not present

## 2013-03-11 DIAGNOSIS — D239 Other benign neoplasm of skin, unspecified: Secondary | ICD-10-CM | POA: Diagnosis not present

## 2013-03-11 DIAGNOSIS — L821 Other seborrheic keratosis: Secondary | ICD-10-CM | POA: Diagnosis not present

## 2013-03-11 DIAGNOSIS — D1801 Hemangioma of skin and subcutaneous tissue: Secondary | ICD-10-CM | POA: Diagnosis not present

## 2013-03-11 DIAGNOSIS — D219 Benign neoplasm of connective and other soft tissue, unspecified: Secondary | ICD-10-CM | POA: Diagnosis not present

## 2013-03-15 DIAGNOSIS — E871 Hypo-osmolality and hyponatremia: Secondary | ICD-10-CM | POA: Diagnosis not present

## 2013-03-15 DIAGNOSIS — C73 Malignant neoplasm of thyroid gland: Secondary | ICD-10-CM | POA: Diagnosis not present

## 2013-03-15 DIAGNOSIS — Z1331 Encounter for screening for depression: Secondary | ICD-10-CM | POA: Diagnosis not present

## 2013-03-15 DIAGNOSIS — M353 Polymyalgia rheumatica: Secondary | ICD-10-CM | POA: Diagnosis not present

## 2013-03-15 DIAGNOSIS — G4733 Obstructive sleep apnea (adult) (pediatric): Secondary | ICD-10-CM | POA: Diagnosis not present

## 2013-03-15 DIAGNOSIS — E890 Postprocedural hypothyroidism: Secondary | ICD-10-CM | POA: Diagnosis not present

## 2013-03-15 DIAGNOSIS — M810 Age-related osteoporosis without current pathological fracture: Secondary | ICD-10-CM | POA: Diagnosis not present

## 2013-03-15 DIAGNOSIS — E559 Vitamin D deficiency, unspecified: Secondary | ICD-10-CM | POA: Diagnosis not present

## 2013-03-30 ENCOUNTER — Other Ambulatory Visit: Payer: Self-pay

## 2013-03-30 DIAGNOSIS — Z1231 Encounter for screening mammogram for malignant neoplasm of breast: Secondary | ICD-10-CM

## 2013-04-11 ENCOUNTER — Ambulatory Visit
Admission: RE | Admit: 2013-04-11 | Discharge: 2013-04-11 | Disposition: A | Discharge status: Home / Self Care | Payer: Medicare Other | Source: Ambulatory Visit | Attending: Internal Medicine | Admitting: Internal Medicine

## 2013-04-11 DIAGNOSIS — M810 Age-related osteoporosis without current pathological fracture: Secondary | ICD-10-CM | POA: Diagnosis not present

## 2013-05-09 ENCOUNTER — Ambulatory Visit
Admission: RE | Admit: 2013-05-09 | Discharge: 2013-05-09 | Disposition: A | Discharge status: Home / Self Care | Payer: Medicare Other | Source: Ambulatory Visit

## 2013-05-09 DIAGNOSIS — Z1231 Encounter for screening mammogram for malignant neoplasm of breast: Secondary | ICD-10-CM

## 2013-06-21 DIAGNOSIS — M999 Biomechanical lesion, unspecified: Secondary | ICD-10-CM | POA: Diagnosis not present

## 2013-06-21 DIAGNOSIS — M5137 Other intervertebral disc degeneration, lumbosacral region: Secondary | ICD-10-CM | POA: Diagnosis not present

## 2013-08-30 DIAGNOSIS — M999 Biomechanical lesion, unspecified: Secondary | ICD-10-CM | POA: Diagnosis not present

## 2013-08-30 DIAGNOSIS — M5137 Other intervertebral disc degeneration, lumbosacral region: Secondary | ICD-10-CM | POA: Diagnosis not present

## 2013-09-02 DIAGNOSIS — Z85828 Personal history of other malignant neoplasm of skin: Secondary | ICD-10-CM | POA: Diagnosis not present

## 2013-09-02 DIAGNOSIS — L570 Actinic keratosis: Secondary | ICD-10-CM | POA: Diagnosis not present

## 2013-09-02 DIAGNOSIS — I789 Disease of capillaries, unspecified: Secondary | ICD-10-CM | POA: Diagnosis not present

## 2013-09-06 DIAGNOSIS — I10 Essential (primary) hypertension: Secondary | ICD-10-CM | POA: Diagnosis not present

## 2013-09-06 DIAGNOSIS — G4733 Obstructive sleep apnea (adult) (pediatric): Secondary | ICD-10-CM | POA: Diagnosis not present

## 2013-09-06 DIAGNOSIS — M810 Age-related osteoporosis without current pathological fracture: Secondary | ICD-10-CM | POA: Diagnosis not present

## 2013-09-07 DIAGNOSIS — M5137 Other intervertebral disc degeneration, lumbosacral region: Secondary | ICD-10-CM | POA: Diagnosis not present

## 2013-09-07 DIAGNOSIS — M999 Biomechanical lesion, unspecified: Secondary | ICD-10-CM | POA: Diagnosis not present

## 2013-09-14 DIAGNOSIS — M5137 Other intervertebral disc degeneration, lumbosacral region: Secondary | ICD-10-CM | POA: Diagnosis not present

## 2013-09-14 DIAGNOSIS — M999 Biomechanical lesion, unspecified: Secondary | ICD-10-CM | POA: Diagnosis not present

## 2013-10-13 DIAGNOSIS — M999 Biomechanical lesion, unspecified: Secondary | ICD-10-CM | POA: Diagnosis not present

## 2013-10-13 DIAGNOSIS — M5137 Other intervertebral disc degeneration, lumbosacral region: Secondary | ICD-10-CM | POA: Diagnosis not present

## 2013-10-19 DIAGNOSIS — H01009 Unspecified blepharitis unspecified eye, unspecified eyelid: Secondary | ICD-10-CM | POA: Diagnosis not present

## 2013-10-19 DIAGNOSIS — H43819 Vitreous degeneration, unspecified eye: Secondary | ICD-10-CM | POA: Diagnosis not present

## 2013-10-19 DIAGNOSIS — H04129 Dry eye syndrome of unspecified lacrimal gland: Secondary | ICD-10-CM | POA: Diagnosis not present

## 2013-10-19 DIAGNOSIS — Z961 Presence of intraocular lens: Secondary | ICD-10-CM | POA: Diagnosis not present

## 2013-11-02 DIAGNOSIS — M999 Biomechanical lesion, unspecified: Secondary | ICD-10-CM | POA: Diagnosis not present

## 2013-11-02 DIAGNOSIS — M5137 Other intervertebral disc degeneration, lumbosacral region: Secondary | ICD-10-CM | POA: Diagnosis not present

## 2013-11-16 DIAGNOSIS — G4733 Obstructive sleep apnea (adult) (pediatric): Secondary | ICD-10-CM | POA: Diagnosis not present

## 2013-11-28 DIAGNOSIS — M999 Biomechanical lesion, unspecified: Secondary | ICD-10-CM | POA: Diagnosis not present

## 2013-11-28 DIAGNOSIS — M5137 Other intervertebral disc degeneration, lumbosacral region: Secondary | ICD-10-CM | POA: Diagnosis not present

## 2013-12-26 DIAGNOSIS — M1710 Unilateral primary osteoarthritis, unspecified knee: Secondary | ICD-10-CM | POA: Diagnosis not present

## 2014-01-24 DIAGNOSIS — M1710 Unilateral primary osteoarthritis, unspecified knee: Secondary | ICD-10-CM | POA: Diagnosis not present

## 2014-01-24 DIAGNOSIS — M25569 Pain in unspecified knee: Secondary | ICD-10-CM | POA: Diagnosis not present

## 2014-02-23 ENCOUNTER — Other Ambulatory Visit: Payer: Self-pay | Admitting: Dermatology

## 2014-02-23 DIAGNOSIS — L570 Actinic keratosis: Secondary | ICD-10-CM | POA: Diagnosis not present

## 2014-02-23 DIAGNOSIS — M25511 Pain in right shoulder: Secondary | ICD-10-CM | POA: Diagnosis not present

## 2014-02-23 DIAGNOSIS — D225 Melanocytic nevi of trunk: Secondary | ICD-10-CM | POA: Diagnosis not present

## 2014-02-23 DIAGNOSIS — M5012 Cervical disc disorder with radiculopathy, mid-cervical region: Secondary | ICD-10-CM | POA: Diagnosis not present

## 2014-02-23 DIAGNOSIS — Z85828 Personal history of other malignant neoplasm of skin: Secondary | ICD-10-CM | POA: Diagnosis not present

## 2014-02-23 DIAGNOSIS — L720 Epidermal cyst: Secondary | ICD-10-CM | POA: Diagnosis not present

## 2014-02-23 DIAGNOSIS — D2261 Melanocytic nevi of right upper limb, including shoulder: Secondary | ICD-10-CM | POA: Diagnosis not present

## 2014-02-23 DIAGNOSIS — C44719 Basal cell carcinoma of skin of left lower limb, including hip: Secondary | ICD-10-CM | POA: Diagnosis not present

## 2014-02-23 DIAGNOSIS — L821 Other seborrheic keratosis: Secondary | ICD-10-CM | POA: Diagnosis not present

## 2014-02-23 DIAGNOSIS — D2262 Melanocytic nevi of left upper limb, including shoulder: Secondary | ICD-10-CM | POA: Diagnosis not present

## 2014-03-01 DIAGNOSIS — Z23 Encounter for immunization: Secondary | ICD-10-CM | POA: Diagnosis not present

## 2014-03-09 DIAGNOSIS — M353 Polymyalgia rheumatica: Secondary | ICD-10-CM | POA: Diagnosis not present

## 2014-03-09 DIAGNOSIS — M810 Age-related osteoporosis without current pathological fracture: Secondary | ICD-10-CM | POA: Diagnosis not present

## 2014-03-09 DIAGNOSIS — G4733 Obstructive sleep apnea (adult) (pediatric): Secondary | ICD-10-CM | POA: Diagnosis not present

## 2014-03-09 DIAGNOSIS — I10 Essential (primary) hypertension: Secondary | ICD-10-CM | POA: Diagnosis not present

## 2014-03-09 DIAGNOSIS — Z1389 Encounter for screening for other disorder: Secondary | ICD-10-CM | POA: Diagnosis not present

## 2014-03-09 DIAGNOSIS — Z01818 Encounter for other preprocedural examination: Secondary | ICD-10-CM | POA: Diagnosis not present

## 2014-03-09 DIAGNOSIS — Z0000 Encounter for general adult medical examination without abnormal findings: Secondary | ICD-10-CM | POA: Diagnosis not present

## 2014-03-09 DIAGNOSIS — F418 Other specified anxiety disorders: Secondary | ICD-10-CM | POA: Diagnosis not present

## 2014-03-09 DIAGNOSIS — R350 Frequency of micturition: Secondary | ICD-10-CM | POA: Diagnosis not present

## 2014-03-13 DIAGNOSIS — G4733 Obstructive sleep apnea (adult) (pediatric): Secondary | ICD-10-CM | POA: Diagnosis not present

## 2014-03-13 DIAGNOSIS — Z0000 Encounter for general adult medical examination without abnormal findings: Secondary | ICD-10-CM | POA: Diagnosis not present

## 2014-03-13 DIAGNOSIS — M810 Age-related osteoporosis without current pathological fracture: Secondary | ICD-10-CM | POA: Diagnosis not present

## 2014-03-13 DIAGNOSIS — Z1389 Encounter for screening for other disorder: Secondary | ICD-10-CM | POA: Diagnosis not present

## 2014-03-13 DIAGNOSIS — R350 Frequency of micturition: Secondary | ICD-10-CM | POA: Diagnosis not present

## 2014-03-13 DIAGNOSIS — I10 Essential (primary) hypertension: Secondary | ICD-10-CM | POA: Diagnosis not present

## 2014-03-13 DIAGNOSIS — F418 Other specified anxiety disorders: Secondary | ICD-10-CM | POA: Diagnosis not present

## 2014-03-13 DIAGNOSIS — M353 Polymyalgia rheumatica: Secondary | ICD-10-CM | POA: Diagnosis not present

## 2014-03-15 DIAGNOSIS — E559 Vitamin D deficiency, unspecified: Secondary | ICD-10-CM | POA: Diagnosis not present

## 2014-03-15 DIAGNOSIS — E871 Hypo-osmolality and hyponatremia: Secondary | ICD-10-CM | POA: Diagnosis not present

## 2014-03-15 DIAGNOSIS — E890 Postprocedural hypothyroidism: Secondary | ICD-10-CM | POA: Diagnosis not present

## 2014-03-15 DIAGNOSIS — G4733 Obstructive sleep apnea (adult) (pediatric): Secondary | ICD-10-CM | POA: Diagnosis not present

## 2014-03-15 DIAGNOSIS — C73 Malignant neoplasm of thyroid gland: Secondary | ICD-10-CM | POA: Diagnosis not present

## 2014-03-15 DIAGNOSIS — M353 Polymyalgia rheumatica: Secondary | ICD-10-CM | POA: Diagnosis not present

## 2014-03-15 DIAGNOSIS — Z6824 Body mass index (BMI) 24.0-24.9, adult: Secondary | ICD-10-CM | POA: Diagnosis not present

## 2014-03-15 DIAGNOSIS — M810 Age-related osteoporosis without current pathological fracture: Secondary | ICD-10-CM | POA: Diagnosis not present

## 2014-03-19 DIAGNOSIS — M79604 Pain in right leg: Secondary | ICD-10-CM | POA: Diagnosis not present

## 2014-03-19 DIAGNOSIS — S8391XA Sprain of unspecified site of right knee, initial encounter: Secondary | ICD-10-CM | POA: Diagnosis not present

## 2014-04-04 ENCOUNTER — Ambulatory Visit: Payer: Self-pay | Admitting: Orthopedic Surgery

## 2014-04-04 NOTE — Progress Notes (Signed)
Preoperative surgical orders have been place into the [facility] hospital system for Lorraine Brandt on 04/04/2014, 2:11 PM  by Mickel Crow for surgery on 04-25-2014.  Preop Total Knee orders including Experal, IV Tylenol, and IV Decadron as long as there are no contraindications to the above medications. Arlee Muslim, PA-C

## 2014-04-05 DIAGNOSIS — Z23 Encounter for immunization: Secondary | ICD-10-CM | POA: Diagnosis not present

## 2014-04-18 ENCOUNTER — Other Ambulatory Visit (HOSPITAL_COMMUNITY): Payer: Self-pay | Admitting: *Deleted

## 2014-04-18 NOTE — Patient Instructions (Addendum)
IVIANNA NOTCH  04/18/2014   Your procedure is scheduled on: Tuesday December 15th, 2015  Report to [facility] and follow signs to               [place] at 725 AM.  Call this number if you have problems the morning of surgery [phone]   Remember:  Do not eat food or drink liquids :After Midnight.     Take these medicines the morning of surgery with A SIP OF WATER:  Prednisone, Omeprazole, Diltiazem,levothyroxine                                You may not have any metal on your body including hair pins and              piercings  Do not wear jewelry, make-up, lotions, powders or perfumes.             Do not wear nail polish.  Do not shave  48 hours prior to surgery.              Men may shave face and neck.   Do not bring valuables to the hospital. [place].  Contacts, dentures or bridgework may not be worn into surgery.  Leave suitcase in the car. After surgery it may be brought to your room.     Patients discharged the day of surgery will not be allowed to drive home.  Name and phone number of your driver:  Special Instructions: N/A              Please read over the following fact sheets you were given: _____________________________________________________________________             [facility] - Preparing for Surgery Before surgery, you can play an important role.  Because skin is not sterile, your skin needs to be as free of germs as possible.  You can reduce the number of germs on your skin by washing with CHG (chlorahexidine gluconate) soap before surgery.  CHG is an antiseptic cleaner which kills germs and bonds with the skin to continue killing germs even after washing. Please DO NOT use if you have an allergy to CHG or antibacterial soaps.  If your skin becomes reddened/irritated stop using the CHG and inform your nurse when you arrive at Short Stay. Do not  shave (including legs and underarms) for at least 48 hours prior to the first CHG shower.  You may shave your face/neck. Please follow these instructions carefully:  1.  Shower with CHG Soap the night before surgery and the  morning of Surgery.  2.  If you choose to wash your hair, wash your hair first as usual with your  normal  shampoo.  3.  After you shampoo, rinse your hair and body thoroughly to remove the  shampoo.                           4.  Use CHG as you would any other liquid soap.  You can apply chg directly  to the skin and wash  Gently with a scrungie or clean washcloth.  5.  Apply the CHG Soap to your body ONLY FROM THE NECK DOWN.   Do not use on face/ open                           Wound or open sores. Avoid contact with eyes, ears mouth and genitals (private parts).                       Wash face,  Genitals (private parts) with your normal soap.             6.  Wash thoroughly, paying special attention to the area where your surgery  will be performed.  7.  Thoroughly rinse your body with warm water from the neck down.  8.  DO NOT shower/wash with your normal soap after using and rinsing off  the CHG Soap.                9.  Pat yourself dry with a clean towel.            10.  Wear clean pajamas.            11.  Place clean sheets on your bed the night of your first shower and do not  sleep with pets. Day of Surgery : Do not apply any lotions/deodorants the morning of surgery.  Please wear clean clothes to the hospital/surgery center.  FAILURE TO FOLLOW THESE INSTRUCTIONS MAY RESULT IN THE CANCELLATION OF YOUR SURGERY PATIENT SIGNATURE_________________________________  NURSE SIGNATURE__________________________________  ________________________________________________________________________   Adam Phenix  An incentive spirometer is a tool that can help keep your lungs clear and active. This tool measures how well you are filling your lungs  with each breath. Taking long deep breaths may help reverse or decrease the chance of developing breathing (pulmonary) problems (especially infection) following:  A long period of time when you are unable to move or be active. BEFORE THE PROCEDURE   If the spirometer includes an indicator to show your best effort, your nurse or respiratory therapist will set it to a desired goal.  If possible, sit up straight or lean slightly forward. Try not to slouch.  Hold the incentive spirometer in an upright position. INSTRUCTIONS FOR USE   Sit on the edge of your bed if possible, or sit up as far as you can in bed or on a chair.  Hold the incentive spirometer in an upright position.  Breathe out normally.  Place the mouthpiece in your mouth and seal your lips tightly around it.  Breathe in slowly and as deeply as possible, raising the piston or the ball toward the top of the column.  Hold your breath for 3-5 seconds or for as long as possible. Allow the piston or ball to fall to the bottom of the column.  Remove the mouthpiece from your mouth and breathe out normally.  Rest for a few seconds and repeat Steps 1 through 7 at least 10 times every 1-2 hours when you are awake. Take your time and take a few normal breaths between deep breaths.  The spirometer may include an indicator to show your best effort. Use the indicator as a goal to work toward during each repetition.  After each set of 10 deep breaths, practice coughing to be sure your lungs are clear. If you have an incision (the cut made at the time of surgery),  support your incision when coughing by placing a pillow or rolled up towels firmly against it. Once you are able to get out of bed, walk around indoors and cough well. You may stop using the incentive spirometer when instructed by your caregiver.  RISKS AND COMPLICATIONS  Take your time so you do not get dizzy or light-headed.  If you are in pain, you may need to take or ask  for pain medication before doing incentive spirometry. It is harder to take a deep breath if you are having pain. AFTER USE  Rest and breathe slowly and easily.  It can be helpful to keep track of a log of your progress. Your caregiver can provide you with a simple table to help with this. If you are using the spirometer at home, follow these instructions: [place] IF:   You are having difficultly using the spirometer.  You have trouble using the spirometer as often as instructed.  Your pain medication is not giving enough relief while using the spirometer.  You develop fever of 100.5 F (38.1 C) or higher. SEEK IMMEDIATE MEDICAL CARE IF:   You cough up bloody sputum that had not been present before.  You develop fever of 102 F (38.9 C) or greater.  You develop worsening pain at or near the incision site. MAKE SURE YOU:   Understand these instructions.  Will watch your condition.  Will get help right away if you are not doing well or get worse. Document Released: 09/08/2006 Document Revised: 07/21/2011 Document Reviewed: 11/09/2006 ExitCare Patient Information 2014 ExitCare, [place].   ________________________________________________________________________  WHAT IS A BLOOD TRANSFUSION? Blood Transfusion Information  A transfusion is the replacement of blood or some of its parts. Blood is made up of multiple cells which provide different functions.  Red blood cells carry oxygen and are used for blood loss replacement.  White blood cells fight against infection.  Platelets control bleeding.  Plasma helps clot blood.  Other blood products are available for specialized needs, such as hemophilia or other clotting disorders. BEFORE THE TRANSFUSION  Who gives blood for transfusions?   Healthy volunteers who are fully evaluated to make sure their blood is safe. This is blood bank blood. Transfusion therapy is the safest it has ever been in the practice of  medicine. Before blood is taken from a donor, a complete history is taken to make sure that person has no history of diseases nor engages in risky social behavior (examples are intravenous drug use or sexual activity with multiple partners). The donor's travel history is screened to minimize risk of transmitting infections, such as malaria. The donated blood is tested for signs of infectious diseases, such as HIV and hepatitis. The blood is then tested to be sure it is compatible with you in order to minimize the chance of a transfusion reaction. If you or a relative donates blood, this is often done in anticipation of surgery and is not appropriate for emergency situations. It takes many days to process the donated blood. RISKS AND COMPLICATIONS Although transfusion therapy is very safe and saves many lives, the main dangers of transfusion include:   Getting an infectious disease.  Developing a transfusion reaction. This is an allergic reaction to something in the blood you were given. Every precaution is taken to prevent this. The decision to have a blood transfusion has been considered carefully by your caregiver before blood is given. Blood is not given unless the benefits outweigh the risks. AFTER THE TRANSFUSION  Right after receiving a blood transfusion, you will usually feel much better and more energetic. This is especially true if your red blood cells have gotten low (anemic). The transfusion raises the level of the red blood cells which carry oxygen, and this usually causes an energy increase.  The nurse administering the transfusion will monitor you carefully for complications. HOME CARE INSTRUCTIONS  No special instructions are needed after a transfusion. You may find your energy is better. Speak with your caregiver about any limitations on activity for underlying diseases you may have. SEEK MEDICAL CARE IF:   Your condition is not improving after your transfusion.  You develop  redness or irritation at the intravenous (IV) site. SEEK IMMEDIATE MEDICAL CARE IF:  Any of the following symptoms occur over the next 12 hours:  Shaking chills.  You have a temperature by mouth above 102 F (38.9 C), not controlled by medicine.  Chest, back, or muscle pain.  People around you feel you are not acting correctly or are confused.  Shortness of breath or difficulty breathing.  Dizziness and fainting.  You get a rash or develop hives.  You have a decrease in urine output.  Your urine turns a dark color or changes to pink, red, or brown. Any of the following symptoms occur over the next 10 days:  You have a temperature by mouth above 102 F (38.9 C), not controlled by medicine.  Shortness of breath.  Weakness after normal activity.  The white part of the eye turns yellow (jaundice).  You have a decrease in the amount of urine or are urinating less often.  Your urine turns a dark color or changes to pink, red, or brown. Document Released: 04/25/2000 Document Revised: 07/21/2011 Document Reviewed: 12/13/2007 [facility] Patient Information 2014 [place], [place].  _______________________________________________________________________

## 2014-04-18 NOTE — Progress Notes (Signed)
ekg 03-09-2014 dr Theadore Nan on chart

## 2014-04-19 ENCOUNTER — Other Ambulatory Visit: Payer: Self-pay

## 2014-04-19 ENCOUNTER — Encounter (HOSPITAL_COMMUNITY)
Admission: RE | Admit: 2014-04-19 | Discharge: 2014-04-19 | Disposition: A | Discharge status: Home / Self Care | Payer: Medicare Other | Source: Ambulatory Visit | Attending: Orthopedic Surgery | Admitting: Orthopedic Surgery

## 2014-04-19 ENCOUNTER — Encounter (HOSPITAL_COMMUNITY): Payer: Self-pay | Admitting: *Deleted

## 2014-04-19 ENCOUNTER — Ambulatory Visit (HOSPITAL_COMMUNITY)
Admission: RE | Admit: 2014-04-19 | Discharge: 2014-04-19 | Disposition: A | Discharge status: Home / Self Care | Payer: Medicare Other | Source: Ambulatory Visit | Attending: Anesthesiology | Admitting: Anesthesiology

## 2014-04-19 ENCOUNTER — Encounter (HOSPITAL_COMMUNITY): Payer: Self-pay

## 2014-04-19 DIAGNOSIS — M353 Polymyalgia rheumatica: Secondary | ICD-10-CM | POA: Insufficient documentation

## 2014-04-19 DIAGNOSIS — Z01818 Encounter for other preprocedural examination: Secondary | ICD-10-CM | POA: Diagnosis not present

## 2014-04-19 DIAGNOSIS — Z01812 Encounter for preprocedural laboratory examination: Secondary | ICD-10-CM | POA: Diagnosis not present

## 2014-04-19 DIAGNOSIS — Z7952 Long term (current) use of systemic steroids: Secondary | ICD-10-CM | POA: Insufficient documentation

## 2014-04-19 DIAGNOSIS — M179 Osteoarthritis of knee, unspecified: Secondary | ICD-10-CM | POA: Insufficient documentation

## 2014-04-19 DIAGNOSIS — E039 Hypothyroidism, unspecified: Secondary | ICD-10-CM | POA: Insufficient documentation

## 2014-04-19 DIAGNOSIS — Z1231 Encounter for screening mammogram for malignant neoplasm of breast: Secondary | ICD-10-CM

## 2014-04-19 DIAGNOSIS — I10 Essential (primary) hypertension: Secondary | ICD-10-CM | POA: Insufficient documentation

## 2014-04-19 HISTORY — DX: Malignant (primary) neoplasm, unspecified: C80.1

## 2014-04-19 LAB — SURGICAL PCR SCREEN
MRSA, PCR: NEGATIVE
STAPHYLOCOCCUS AUREUS: NEGATIVE

## 2014-04-19 LAB — COMPREHENSIVE METABOLIC PANEL
ALT: 14 U/L (ref 0–35)
AST: 20 U/L (ref 0–37)
Albumin: 4.1 g/dL (ref 3.5–5.2)
Alkaline Phosphatase: 71 U/L (ref 39–117)
Anion gap: 13 (ref 5–15)
BUN: 17 mg/dL (ref 6–23)
CALCIUM: 10.1 mg/dL (ref 8.4–10.5)
CO2: 29 mEq/L (ref 19–32)
Chloride: 94 mEq/L — ABNORMAL LOW (ref 96–112)
Creatinine, Ser: 0.67 mg/dL (ref 0.50–1.10)
GFR calc Af Amer: >90 mL/min (ref >90)
GFR calc non Af Amer: 82 mL/min — ABNORMAL LOW (ref >90)
Glucose, Bld: 99 mg/dL (ref 70–99)
Potassium: 5.1 mEq/L (ref 3.7–5.3)
SODIUM: 136 meq/L — AB (ref 137–147)
TOTAL PROTEIN: 7.3 g/dL (ref 6.0–8.3)
Total Bilirubin: 0.4 mg/dL (ref 0.3–1.2)

## 2014-04-19 LAB — CBC
HCT: 40.1 % (ref 36.0–46.0)
Hemoglobin: 13.6 g/dL (ref 12.0–15.0)
MCH: 30.5 pg (ref 26.0–34.0)
MCHC: 33.9 g/dL (ref 30.0–36.0)
MCV: 89.9 fL (ref 78.0–100.0)
PLATELETS: 371 10*3/uL (ref 150–400)
RBC: 4.46 MIL/uL (ref 3.87–5.11)
RDW: 12.6 % (ref 11.5–15.5)
WBC: 10 10*3/uL (ref 4.0–10.5)

## 2014-04-19 LAB — APTT: aPTT: 26 seconds (ref 24–37)

## 2014-04-19 LAB — URINALYSIS, ROUTINE W REFLEX MICROSCOPIC
Bilirubin Urine: NEGATIVE
Glucose, UA: NEGATIVE mg/dL
Hgb urine dipstick: NEGATIVE
KETONES UR: NEGATIVE mg/dL
Nitrite: NEGATIVE
Protein, ur: NEGATIVE mg/dL
SPECIFIC GRAVITY, URINE: 1.012 (ref 1.005–1.030)
Urobilinogen, UA: 0.2 mg/dL (ref 0.0–1.0)
pH: 7.5 (ref 5.0–8.0)

## 2014-04-19 LAB — PROTIME-INR
INR: 0.94 (ref 0.00–1.49)
Prothrombin Time: 12.6 seconds (ref 11.6–15.2)

## 2014-04-19 LAB — URINE MICROSCOPIC-ADD ON

## 2014-04-19 NOTE — Progress Notes (Signed)
U/A and micro results done 04/19/2014  faxed via EPIC to Dr Wynelle Link.

## 2014-04-21 NOTE — Progress Notes (Signed)
Fax received from Dr. Wynelle Link and placed on chart- concerning No action needed for U/A results done on 04/19/14.

## 2014-04-24 ENCOUNTER — Ambulatory Visit: Payer: Self-pay | Admitting: Orthopedic Surgery

## 2014-04-24 NOTE — H&P (Signed)
Lorraine Brandt. Guercio DOB: November 07, 1933 Married / Language: English / Race: White Female Date of Admission:  04-25-2014 CC:  Right Knee Pain History of Present Illness (Alexzandrew L. Perkins III PA-C; 04/19/14 4:52 PM) The patient is a 80 year old female who comes in for a preoperative History and Physical. The patient is scheduled for a right total knee arthroplasty to be performed by Dr. Dione Plover. Aluisio, MD at [facility] on 04-25-2014. The patient is a 80 year old female who presents  for follow up of their knee. The patient is being followed for their right knee pain and osteoarthritis. Symptoms reported today include: pain and giving way. The patient feels that they are doing poorly and report their pain level to be mild to moderate (varies in severity). Current treatment includes: NSAIDs (Ibuprofen (prn)). The following medication has been used for pain control: antiinflammatory medication (Ibuprofen). The patient presents today following 4 weeks post cortisone injection. Note for "Follow-up Knee": Not alot of relief w/ the cortisone injection. Pain does vary in severity. Really concern about the knee giving way alot. Doesnt want to fall and hurt herself.  She states that her right knee is getting progressively worse. From a pain standpoint it does bother her but she is having more problems with function. Her knee is bowing into progressive valgus and becoming unstable. Even with wearing a brace she feels as though the knee gives out on her. She is concerned that she may fall and injure herself. She is not having any significant swelling in the knee. It does hurt her but the bigger problem is the dysfunction. She is ready to proceed with surgery. They have been treated conservatively in the past for the above stated problem and despite conservative measures, they continue to have progressive pain and severe functional limitations and dysfunction. They have failed non-operative management  including home exercise, medications, and injections. It is felt that they would benefit from undergoing total joint replacement. Risks and benefits of the procedure have been discussed with the patient and they elect to proceed with surgery. There are no active contraindications to surgery such as ongoing infection or rapidly progressive neurological disease.  Problem List/Past Medical (Alexzandrew Monika Salk, III PA-C; 04/18/2014 12:27 PM) DDD (degenerative disc disease), cervical (722.4) Impingement Syndrome (726.2) Osteoarthritis, Knee (715.96) Scoliosis, Idiopathic (737.30) Primary osteoarthritis of right knee (M17.11) Right shoulder pain (M25.511) Cervical disc disorder with radiculopathy, mid-cervical region (M50.12) Gastroesophageal Reflux Disease Osteoarthritis High blood pressure Osteoporosis Sleep Apnea Degenerative Disc Disease Hypothyroidism Following surgery and radiation. Thyroid Cancer  Allergies  Hydrocodone-Acetaminophen *ANALGESICS - OPIOID* Itching.  Family History (Alexzandrew Monika Salk, III PA-C; Apr 19, 2014 4:54 PM) Father Deceased. Kidney Disease Mother Deceased. Cancer  Social History (Alexzandrew Monika Salk, III PA-C; Apr 19, 2014 4:54 PM) Cancer mother and grandmother mothers side Cerebrovascular Accident father and grandfather mothers side Number of flights of stairs before winded 2-3 Drug/Alcohol Rehab (Currently) no Alcohol use current drinker; drinks wine; 5-7 per week Current work status retired Hypertension mother and brother Children 3 Illicit drug use no Tobacco use Never smoker. Living situation live with spouse Marital status married Pain Contract no Exercise Exercises daily; does running / walking Drug/Alcohol Rehab (Previously) no Advance Directives Living Will, Healthcare POA  Medication History (Alexzandrew L Perkins, III PA-C; 2014-04-19 4:45 PM) Alendronate Sodium (70MG  Tablet, Oral)  Active. PredniSONE (1MG  Tablet, Oral) Active. Omeprazole (20MG  Capsule DR, Oral) Active. Levoxyl (125MCG Tablet, Oral) Active. Hydrochlorothiazide (25MG  Tablet, Oral) Active. Diltiazem HCl Coated Beads (300MG  Capsule  ER 24HR, Oral) Active.  Past Surgical History (Alexzandrew Monika Salk, III PA-C; 04/04/2014 4:48 PM) Spinal Decompression Date: 2005. lower back Dilation and Curettage of Uterus Thyroidectomy; Total Date: 2001. Partial Spinal Surgery Cataract Surgery Date: 2007. bilateral Tonsillectomy Date: 88. Tubal Ligation  Review of Systems (Alexzandrew L. Perkins III PA-C; 04/04/2014 4:33 PM) General Not Present- Chills, Fatigue, Fever, Memory Loss, Night Sweats, Weight Gain and Weight Loss. Skin Not Present- Eczema, Hives, Itching, Lesions and Rash. HEENT Not Present- Dentures, Double Vision, Headache, Hearing Loss, Tinnitus and Visual Loss. Respiratory Not Present- Allergies, Chronic Cough, Coughing up blood, Shortness of breath at rest and Shortness of breath with exertion. Cardiovascular Not Present- Chest Pain, Difficulty Breathing Lying Down, Murmur, Palpitations, Racing/skipping heartbeats and Swelling. Gastrointestinal Not Present- Abdominal Pain, Bloody Stool, Constipation, Diarrhea, Difficulty Swallowing, Heartburn, Jaundice, Loss of appetitie, Nausea and Vomiting. Female Genitourinary Not Present- Blood in Urine, Discharge, Flank Pain, Incontinence, Painful Urination, Urgency, Urinary frequency, Urinary Retention, Urinating at Night and Weak urinary stream. Musculoskeletal Present- Back Pain and Joint Pain. Not Present- Joint Swelling, Morning Stiffness, Muscle Pain, Muscle Weakness and Spasms. Neurological Not Present- Blackout spells, Difficulty with balance, Dizziness, Paralysis, Tremor and Weakness. Psychiatric Not Present- Insomnia.   Vitals (Alexzandrew L. Perkins III PA-C; 04/04/2014 4:57 PM) 04/04/2014 4:55 PM Weight: 130 lb Height: 62in Weight  was reported by patient. Height was reported by patient. Body Surface Area: 1.59 m Body Mass Index: 23.78 kg/m  BP: 140/78 (Sitting, Right Arm, Standard)   Physical Exam (Alexzandrew L. Perkins III PA-C; 04/04/2014 4:59 PM) General Mental Status -Alert, cooperative and good historian. General Appearance-pleasant, Not in acute distress. Orientation-Oriented X3. Build & Nutrition-Well nourished and Well developed.  Head and Neck Head-normocephalic, atraumatic . Neck Global Assessment - supple, no bruit auscultated on the right, no bruit auscultated on the left.  Eye Vision-Wears corrective lenses. Pupil - Bilateral-Regular and Round. Motion - Bilateral-EOMI.  Chest and Lung Exam Auscultation Breath sounds - clear at anterior chest wall and clear at posterior chest wall. Adventitious sounds - No Adventitious sounds.  Cardiovascular Auscultation Rhythm - Regular rate and rhythm. Heart Sounds - S1 WNL and S2 WNL. Murmurs & Other Heart Sounds - Auscultation of the heart reveals - No Murmurs.  Abdomen Palpation/Percussion Tenderness - Abdomen is non-tender to palpation. Rigidity (guarding) - Abdomen is soft. Auscultation Auscultation of the abdomen reveals - Bowel sounds normal.  Female Genitourinary Note: Not done, not pertinent to present illness   Musculoskeletal Note: On exam, she is alert and oriented in no apparent distress. Her right knee shows a very significant valgus deformity. There is no effusion in the knee. Her right hip has normal motion with no discomfort. Range is 0-125. She does have some pseudo correction back to normal alignment. There is no gross instability about the knee. Her left knee shows no effusion. Slight crepitus on range of motion. Range is 0-135 with no deformity.  RADIOGRAPHS: X-rays show bone on bone arthritis in the lateral and patellofemoral compartments of the right knee with about a 10 degree valgus deformity.  Clinically, valgus is worse than the x-ray shows.   Assessment & Plan (Alexzandrew L. Perkins III PA-C; 04/04/2014 4:39 PM) Primary osteoarthritis of right knee (M17.11) Note:Plan is for a Right Total Knee Replacement by Dr. Wynelle Link.  Plan is to go home.  PCP - Dr. Cari Caraway  Topical TXA - thyroid cancer  Signed electronically by Joelene Millin, III PA-C

## 2014-04-25 ENCOUNTER — Inpatient Hospital Stay (HOSPITAL_COMMUNITY): Payer: Medicare Other | Admitting: Anesthesiology

## 2014-04-25 ENCOUNTER — Encounter (HOSPITAL_COMMUNITY): Payer: Self-pay | Admitting: *Deleted

## 2014-04-25 ENCOUNTER — Encounter (HOSPITAL_COMMUNITY)
Admission: RE | Disposition: A | Discharge status: Home Health | Payer: Self-pay | Source: Ambulatory Visit | Attending: Orthopedic Surgery

## 2014-04-25 ENCOUNTER — Inpatient Hospital Stay (HOSPITAL_COMMUNITY)
Admission: RE | Admit: 2014-04-25 | Discharge: 2014-04-28 | DRG: 470 | Disposition: A | Discharge status: Home Health | Payer: Medicare Other | Source: Ambulatory Visit | Attending: Orthopedic Surgery | Admitting: Orthopedic Surgery

## 2014-04-25 DIAGNOSIS — G4730 Sleep apnea, unspecified: Secondary | ICD-10-CM | POA: Diagnosis present

## 2014-04-25 DIAGNOSIS — M810 Age-related osteoporosis without current pathological fracture: Secondary | ICD-10-CM | POA: Diagnosis present

## 2014-04-25 DIAGNOSIS — I10 Essential (primary) hypertension: Secondary | ICD-10-CM | POA: Diagnosis present

## 2014-04-25 DIAGNOSIS — M1711 Unilateral primary osteoarthritis, right knee: Principal | ICD-10-CM

## 2014-04-25 DIAGNOSIS — E039 Hypothyroidism, unspecified: Secondary | ICD-10-CM | POA: Diagnosis present

## 2014-04-25 DIAGNOSIS — M419 Scoliosis, unspecified: Secondary | ICD-10-CM | POA: Diagnosis present

## 2014-04-25 DIAGNOSIS — M1710 Unilateral primary osteoarthritis, unspecified knee: Secondary | ICD-10-CM | POA: Diagnosis present

## 2014-04-25 DIAGNOSIS — K219 Gastro-esophageal reflux disease without esophagitis: Secondary | ICD-10-CM | POA: Diagnosis present

## 2014-04-25 DIAGNOSIS — M353 Polymyalgia rheumatica: Secondary | ICD-10-CM | POA: Diagnosis present

## 2014-04-25 DIAGNOSIS — M5010 Cervical disc disorder with radiculopathy, unspecified cervical region: Secondary | ICD-10-CM | POA: Diagnosis present

## 2014-04-25 DIAGNOSIS — M179 Osteoarthritis of knee, unspecified: Secondary | ICD-10-CM | POA: Diagnosis not present

## 2014-04-25 HISTORY — DX: Sleep apnea, unspecified: G47.30

## 2014-04-25 HISTORY — DX: Unspecified osteoarthritis, unspecified site: M19.90

## 2014-04-25 HISTORY — DX: Age-related osteoporosis without current pathological fracture: M81.0

## 2014-04-25 HISTORY — DX: Hypothyroidism, unspecified: E03.9

## 2014-04-25 HISTORY — PX: TOTAL KNEE ARTHROPLASTY: SHX125

## 2014-04-25 LAB — TYPE AND SCREEN
ABO/RH(D): B POS
ANTIBODY SCREEN: NEGATIVE

## 2014-04-25 LAB — ABO/RH: ABO/RH(D): B POS

## 2014-04-25 SURGERY — ARTHROPLASTY, KNEE, TOTAL
Anesthesia: Spinal | Site: Knee | Laterality: Right

## 2014-04-25 MED ORDER — PROPOFOL INFUSION 10 MG/ML OPTIME
INTRAVENOUS | Status: DC | PRN
  Administered 2014-04-25: 25 ug/kg/min via INTRAVENOUS

## 2014-04-25 MED ORDER — FLEET ENEMA 7-19 GM/118ML RE ENEM: 1.0000 | ENEMA | Freq: Once | RECTAL | Status: AC | PRN

## 2014-04-25 MED ORDER — METOCLOPRAMIDE HCL 5 MG/ML IJ SOLN: 5.0000 mg | Freq: Three times a day (TID) | INTRAMUSCULAR | Status: DC | PRN

## 2014-04-25 MED ORDER — TRANEXAMIC ACID 100 MG/ML IV SOLN
2000.0000 mg | INTRAVENOUS | Status: DC | PRN
  Administered 2014-04-25: 2000 mg via TOPICAL

## 2014-04-25 MED ORDER — PROPOFOL 10 MG/ML IV BOLUS
INTRAVENOUS | Status: AC
  Filled 2014-04-25: qty 20

## 2014-04-25 MED ORDER — HYDROMORPHONE HCL 1 MG/ML IJ SOLN
INTRAMUSCULAR | Status: AC
  Filled 2014-04-25: qty 1

## 2014-04-25 MED ORDER — PANTOPRAZOLE SODIUM 40 MG PO TBEC
40.0000 mg | DELAYED_RELEASE_TABLET | Freq: Every day | ORAL | Status: DC
  Administered 2014-04-26 – 2014-04-28 (×3): 40 mg via ORAL
  Filled 2014-04-25 (×4): qty 1

## 2014-04-25 MED ORDER — DEXAMETHASONE SODIUM PHOSPHATE 10 MG/ML IJ SOLN
10.0000 mg | Freq: Once | INTRAMUSCULAR | Status: AC
  Administered 2014-04-26: 10 mg via INTRAVENOUS
  Filled 2014-04-25: qty 1

## 2014-04-25 MED ORDER — SODIUM CHLORIDE 0.9 % IJ SOLN
INTRAMUSCULAR | Status: AC
  Filled 2014-04-25: qty 50

## 2014-04-25 MED ORDER — FENTANYL CITRATE 0.05 MG/ML IJ SOLN
INTRAMUSCULAR | Status: AC
  Filled 2014-04-25: qty 2

## 2014-04-25 MED ORDER — MORPHINE SULFATE 2 MG/ML IJ SOLN
1.0000 mg | INTRAMUSCULAR | Status: DC | PRN
  Administered 2014-04-25 – 2014-04-26 (×2): 1 mg via INTRAVENOUS
  Filled 2014-04-25 (×2): qty 1

## 2014-04-25 MED ORDER — RIVAROXABAN 10 MG PO TABS
10.0000 mg | ORAL_TABLET | Freq: Every day | ORAL | Status: DC
  Administered 2014-04-26 – 2014-04-28 (×3): 10 mg via ORAL
  Filled 2014-04-25 (×4): qty 1

## 2014-04-25 MED ORDER — TRAMADOL HCL 50 MG PO TABS
50.0000 mg | ORAL_TABLET | Freq: Four times a day (QID) | ORAL | Status: DC | PRN
  Administered 2014-04-26 – 2014-04-28 (×8): 100 mg via ORAL
  Filled 2014-04-25 (×8): qty 2

## 2014-04-25 MED ORDER — DEXTROSE-NACL 5-0.9 % IV SOLN
INTRAVENOUS | Status: DC
  Administered 2014-04-25: 15:00:00 via INTRAVENOUS

## 2014-04-25 MED ORDER — ACETAMINOPHEN 500 MG PO TABS
1000.0000 mg | ORAL_TABLET | Freq: Four times a day (QID) | ORAL | Status: AC
  Administered 2014-04-25 – 2014-04-26 (×4): 1000 mg via ORAL
  Filled 2014-04-25 (×4): qty 2

## 2014-04-25 MED ORDER — FENTANYL CITRATE 0.05 MG/ML IJ SOLN
INTRAMUSCULAR | Status: DC | PRN
  Administered 2014-04-25: 50 ug via INTRAVENOUS

## 2014-04-25 MED ORDER — DILTIAZEM HCL ER COATED BEADS 300 MG PO CP24
300.0000 mg | ORAL_CAPSULE | Freq: Every morning | ORAL | Status: DC
  Administered 2014-04-26 – 2014-04-27 (×2): 300 mg via ORAL
  Filled 2014-04-25 (×4): qty 1

## 2014-04-25 MED ORDER — CEFAZOLIN SODIUM 1-5 GM-% IV SOLN
1.0000 g | Freq: Four times a day (QID) | INTRAVENOUS | Status: AC
  Administered 2014-04-25 (×2): 1 g via INTRAVENOUS
  Filled 2014-04-25 (×2): qty 50

## 2014-04-25 MED ORDER — SODIUM CHLORIDE 0.9 % IR SOLN
Status: DC | PRN
  Administered 2014-04-25: 1000 mL

## 2014-04-25 MED ORDER — CEFAZOLIN SODIUM-DEXTROSE 2-3 GM-% IV SOLR
2.0000 g | INTRAVENOUS | Status: AC
  Administered 2014-04-25: 2 g via INTRAVENOUS

## 2014-04-25 MED ORDER — BISACODYL 10 MG RE SUPP: 10.0000 mg | Freq: Every day | RECTAL | Status: DC | PRN

## 2014-04-25 MED ORDER — PHENYLEPHRINE HCL 10 MG/ML IJ SOLN
INTRAMUSCULAR | Status: AC
  Filled 2014-04-25: qty 1

## 2014-04-25 MED ORDER — METHOCARBAMOL 500 MG PO TABS
500.0000 mg | ORAL_TABLET | Freq: Four times a day (QID) | ORAL | Status: DC | PRN
  Administered 2014-04-25 – 2014-04-28 (×10): 500 mg via ORAL
  Filled 2014-04-25 (×10): qty 1

## 2014-04-25 MED ORDER — MIDAZOLAM HCL 2 MG/2ML IJ SOLN
INTRAMUSCULAR | Status: AC
  Filled 2014-04-25: qty 2

## 2014-04-25 MED ORDER — POLYETHYLENE GLYCOL 3350 17 G PO PACK: 17.0000 g | PACK | Freq: Every day | ORAL | Status: DC | PRN

## 2014-04-25 MED ORDER — BUPIVACAINE HCL (PF) 0.25 % IJ SOLN
INTRAMUSCULAR | Status: AC
  Filled 2014-04-25: qty 30

## 2014-04-25 MED ORDER — DEXAMETHASONE SODIUM PHOSPHATE 10 MG/ML IJ SOLN
10.0000 mg | Freq: Once | INTRAMUSCULAR | Status: AC
  Administered 2014-04-25: 10 mg via INTRAVENOUS

## 2014-04-25 MED ORDER — LACTATED RINGERS IV SOLN
INTRAVENOUS | Status: DC
  Administered 2014-04-25: 1000 mL via INTRAVENOUS
  Administered 2014-04-25: 10:00:00 via INTRAVENOUS

## 2014-04-25 MED ORDER — HYDROMORPHONE HCL 1 MG/ML IJ SOLN
0.2500 mg | INTRAMUSCULAR | Status: DC | PRN
  Administered 2014-04-25 (×3): 0.5 mg via INTRAVENOUS

## 2014-04-25 MED ORDER — DEXTROSE 5 % IV SOLN
10.0000 mg | INTRAVENOUS | Status: DC | PRN
  Administered 2014-04-25: 40 ug/min via INTRAVENOUS

## 2014-04-25 MED ORDER — BUPIVACAINE HCL 0.25 % IJ SOLN
INTRAMUSCULAR | Status: DC | PRN
  Administered 2014-04-25: 20 mL

## 2014-04-25 MED ORDER — OXYCODONE HCL 5 MG PO TABS
5.0000 mg | ORAL_TABLET | ORAL | Status: DC | PRN
  Administered 2014-04-25 – 2014-04-27 (×9): 10 mg via ORAL
  Filled 2014-04-25 (×11): qty 2

## 2014-04-25 MED ORDER — 0.9 % SODIUM CHLORIDE (POUR BTL) OPTIME
TOPICAL | Status: DC | PRN
  Administered 2014-04-25: 1000 mL

## 2014-04-25 MED ORDER — ONDANSETRON HCL 4 MG/2ML IJ SOLN
INTRAMUSCULAR | Status: DC | PRN
  Administered 2014-04-25: 4 mg via INTRAVENOUS

## 2014-04-25 MED ORDER — BUPIVACAINE LIPOSOME 1.3 % IJ SUSP
INTRAMUSCULAR | Status: DC | PRN
  Administered 2014-04-25: 20 mL

## 2014-04-25 MED ORDER — MIDAZOLAM HCL 5 MG/5ML IJ SOLN
INTRAMUSCULAR | Status: DC | PRN
  Administered 2014-04-25: 2 mg via INTRAVENOUS

## 2014-04-25 MED ORDER — LEVOTHYROXINE SODIUM 125 MCG PO TABS
125.0000 ug | ORAL_TABLET | Freq: Every morning | ORAL | Status: DC
  Administered 2014-04-26: 125 ug via ORAL
  Filled 2014-04-25 (×2): qty 1

## 2014-04-25 MED ORDER — PHENOL 1.4 % MT LIQD
1.0000 | OROMUCOSAL | Status: DC | PRN
  Filled 2014-04-25: qty 177

## 2014-04-25 MED ORDER — MENTHOL 3 MG MT LOZG
1.0000 | LOZENGE | OROMUCOSAL | Status: DC | PRN
  Filled 2014-04-25: qty 9

## 2014-04-25 MED ORDER — MEPERIDINE HCL 50 MG/ML IJ SOLN
6.2500 mg | INTRAMUSCULAR | Status: DC | PRN
Start: 2014-04-25 — End: 2014-04-25

## 2014-04-25 MED ORDER — SODIUM CHLORIDE 0.9 % IV SOLN
2000.0000 mg | Freq: Once | INTRAVENOUS | Status: DC
  Filled 2014-04-25: qty 20

## 2014-04-25 MED ORDER — ONDANSETRON HCL 4 MG PO TABS: 4.0000 mg | ORAL_TABLET | Freq: Four times a day (QID) | ORAL | Status: DC | PRN

## 2014-04-25 MED ORDER — HYDROCHLOROTHIAZIDE 25 MG PO TABS
25.0000 mg | ORAL_TABLET | Freq: Every morning | ORAL | Status: DC
  Administered 2014-04-25 – 2014-04-27 (×3): 25 mg via ORAL
  Filled 2014-04-25 (×4): qty 1

## 2014-04-25 MED ORDER — LACTATED RINGERS IV SOLN: INTRAVENOUS | Status: DC | PRN

## 2014-04-25 MED ORDER — PROPOFOL 10 MG/ML IV BOLUS
INTRAVENOUS | Status: DC | PRN
  Administered 2014-04-25: 20 mg via INTRAVENOUS

## 2014-04-25 MED ORDER — DOCUSATE SODIUM 100 MG PO CAPS
100.0000 mg | ORAL_CAPSULE | Freq: Two times a day (BID) | ORAL | Status: DC
  Administered 2014-04-25 – 2014-04-28 (×7): 100 mg via ORAL

## 2014-04-25 MED ORDER — SODIUM CHLORIDE 0.9 % IV BOLUS (SEPSIS)
250.0000 mL | Freq: Once | INTRAVENOUS | Status: AC
  Administered 2014-04-25: 250 mL via INTRAVENOUS

## 2014-04-25 MED ORDER — BUPIVACAINE IN DEXTROSE 0.75-8.25 % IT SOLN
INTRATHECAL | Status: DC | PRN
  Administered 2014-04-25: 1.6 mL via INTRATHECAL

## 2014-04-25 MED ORDER — ACETAMINOPHEN 10 MG/ML IV SOLN
1000.0000 mg | Freq: Once | INTRAVENOUS | Status: AC
  Administered 2014-04-25: 1000 mg via INTRAVENOUS
  Filled 2014-04-25: qty 100

## 2014-04-25 MED ORDER — SODIUM CHLORIDE 0.9 % IV SOLN: INTRAVENOUS | Status: DC

## 2014-04-25 MED ORDER — PREDNISONE 1 MG PO TABS
1.0000 mg | ORAL_TABLET | Freq: Every morning | ORAL | Status: DC
  Administered 2014-04-26 – 2014-04-28 (×3): 1 mg via ORAL
  Filled 2014-04-25 (×3): qty 1

## 2014-04-25 MED ORDER — CEFAZOLIN SODIUM-DEXTROSE 2-3 GM-% IV SOLR
INTRAVENOUS | Status: AC
  Filled 2014-04-25: qty 50

## 2014-04-25 MED ORDER — PROMETHAZINE HCL 25 MG/ML IJ SOLN: 6.2500 mg | INTRAMUSCULAR | Status: DC | PRN

## 2014-04-25 MED ORDER — METOCLOPRAMIDE HCL 10 MG PO TABS: 5.0000 mg | ORAL_TABLET | Freq: Three times a day (TID) | ORAL | Status: DC | PRN

## 2014-04-25 MED ORDER — SODIUM CHLORIDE 0.9 % IJ SOLN
INTRAMUSCULAR | Status: DC | PRN
  Administered 2014-04-25: 30 mL

## 2014-04-25 MED ORDER — DIPHENHYDRAMINE HCL 12.5 MG/5ML PO ELIX
12.5000 mg | ORAL_SOLUTION | ORAL | Status: DC | PRN
  Administered 2014-04-25 – 2014-04-26 (×5): 25 mg via ORAL
  Administered 2014-04-27: 12.5 mg via ORAL
  Administered 2014-04-27: 25 mg via ORAL
  Administered 2014-04-27: 12.5 mg via ORAL
  Filled 2014-04-25 (×5): qty 10
  Filled 2014-04-25: qty 5
  Filled 2014-04-25: qty 10
  Filled 2014-04-25: qty 5

## 2014-04-25 MED ORDER — ACETAMINOPHEN 325 MG PO TABS: 650.0000 mg | ORAL_TABLET | Freq: Four times a day (QID) | ORAL | Status: DC | PRN

## 2014-04-25 MED ORDER — ONDANSETRON HCL 4 MG/2ML IJ SOLN: 4.0000 mg | Freq: Four times a day (QID) | INTRAMUSCULAR | Status: DC | PRN

## 2014-04-25 MED ORDER — PROPOFOL 10 MG/ML IV BOLUS
INTRAVENOUS | Status: AC
Start: 2014-04-25 — End: 2014-04-25
  Filled 2014-04-25: qty 20

## 2014-04-25 MED ORDER — ONDANSETRON HCL 4 MG/2ML IJ SOLN
INTRAMUSCULAR | Status: AC
Start: 2014-04-25 — End: 2014-04-25
  Filled 2014-04-25: qty 2

## 2014-04-25 MED ORDER — BUPIVACAINE LIPOSOME 1.3 % IJ SUSP
20.0000 mL | Freq: Once | INTRAMUSCULAR | Status: DC
  Filled 2014-04-25: qty 20

## 2014-04-25 MED ORDER — ACETAMINOPHEN 650 MG RE SUPP: 650.0000 mg | Freq: Four times a day (QID) | RECTAL | Status: DC | PRN

## 2014-04-25 MED ORDER — KETOROLAC TROMETHAMINE 15 MG/ML IJ SOLN
7.5000 mg | Freq: Four times a day (QID) | INTRAMUSCULAR | Status: AC | PRN
  Administered 2014-04-25 – 2014-04-26 (×3): 7.5 mg via INTRAVENOUS
  Filled 2014-04-25 (×3): qty 1

## 2014-04-25 MED ORDER — METHOCARBAMOL 1000 MG/10ML IJ SOLN
500.0000 mg | Freq: Four times a day (QID) | INTRAMUSCULAR | Status: DC | PRN
  Administered 2014-04-25: 500 mg via INTRAVENOUS
  Filled 2014-04-25 (×2): qty 5

## 2014-04-25 MED ORDER — DEXAMETHASONE SODIUM PHOSPHATE 10 MG/ML IJ SOLN
INTRAMUSCULAR | Status: AC
  Filled 2014-04-25: qty 1

## 2014-04-25 SURGICAL SUPPLY — 66 items
BAG DECANTER FOR FLEXI CONT (MISCELLANEOUS) ×2 IMPLANT
BAG SPEC THK2 15X12 ZIP CLS (MISCELLANEOUS)
BAG ZIPLOCK 12X15 (MISCELLANEOUS) ×1 IMPLANT
BANDAGE ELASTIC 6 VELCRO ST LF (GAUZE/BANDAGES/DRESSINGS) ×3 IMPLANT
BANDAGE ESMARK 6X9 LF (GAUZE/BANDAGES/DRESSINGS) ×1 IMPLANT
BLADE SAG 18X100X1.27 (BLADE) ×3 IMPLANT
BLADE SAW SGTL 11.0X1.19X90.0M (BLADE) ×3 IMPLANT
BNDG CMPR 9X6 STRL LF SNTH (GAUZE/BANDAGES/DRESSINGS) ×1
BNDG ESMARK 6X9 LF (GAUZE/BANDAGES/DRESSINGS) ×3
BOWL SMART MIX CTS (DISPOSABLE) ×3 IMPLANT
CAP KNEE TOTAL 3 SIGMA ×2 IMPLANT
CEMENT HV SMART SET (Cement) ×6 IMPLANT
CLOSURE WOUND 1/2 X4 (GAUZE/BANDAGES/DRESSINGS) ×2
CUFF TOURN SGL QUICK 34 (TOURNIQUET CUFF) ×3
CUFF TRNQT CYL 34X4X40X1 (TOURNIQUET CUFF) ×1 IMPLANT
DECANTER SPIKE VIAL GLASS SM (MISCELLANEOUS) ×3 IMPLANT
DRAPE EXTREMITY T 121X128X90 (DRAPE) ×3 IMPLANT
DRAPE POUCH INSTRU U-SHP 10X18 (DRAPES) ×3 IMPLANT
DRAPE U-SHAPE 47X51 STRL (DRAPES) ×3 IMPLANT
DRSG ADAPTIC 3X8 NADH LF (GAUZE/BANDAGES/DRESSINGS) ×3 IMPLANT
DRSG PAD ABDOMINAL 8X10 ST (GAUZE/BANDAGES/DRESSINGS) ×3 IMPLANT
DURAPREP 26ML APPLICATOR (WOUND CARE) ×3 IMPLANT
ELECT REM PT RETURN 9FT ADLT (ELECTROSURGICAL) ×3
ELECTRODE REM PT RTRN 9FT ADLT (ELECTROSURGICAL) ×1 IMPLANT
EVACUATOR 1/8 PVC DRAIN (DRAIN) ×3 IMPLANT
FACESHIELD WRAPAROUND (MASK) ×12 IMPLANT
FACESHIELD WRAPAROUND OR TEAM (MASK) ×5 IMPLANT
GAUZE SPONGE 4X4 12PLY STRL (GAUZE/BANDAGES/DRESSINGS) ×3 IMPLANT
GLOVE BIO SURGEON STRL SZ8 (GLOVE) ×5 IMPLANT
GLOVE BIOGEL PI IND STRL 6.5 (GLOVE) IMPLANT
GLOVE BIOGEL PI IND STRL 7.5 (GLOVE) IMPLANT
GLOVE BIOGEL PI IND STRL 8 (GLOVE) ×1 IMPLANT
GLOVE BIOGEL PI IND STRL 8.5 (GLOVE) IMPLANT
GLOVE BIOGEL PI INDICATOR 6.5 (GLOVE) ×2
GLOVE BIOGEL PI INDICATOR 7.5 (GLOVE) ×2
GLOVE BIOGEL PI INDICATOR 8 (GLOVE) ×2
GLOVE BIOGEL PI INDICATOR 8.5 (GLOVE) ×2
GLOVE ECLIPSE 7.5 STRL STRAW (GLOVE) ×2 IMPLANT
GLOVE SURG SS PI 6.5 STRL IVOR (GLOVE) ×2 IMPLANT
GOWN STRL REUS W/TWL LRG LVL3 (GOWN DISPOSABLE) ×5 IMPLANT
GOWN STRL REUS W/TWL XL LVL3 (GOWN DISPOSABLE) ×4 IMPLANT
HANDPIECE INTERPULSE COAX TIP (DISPOSABLE) ×3
IMMOBILIZER KNEE 20 (SOFTGOODS) ×3
IMMOBILIZER KNEE 20 THIGH 36 (SOFTGOODS) ×1 IMPLANT
KIT BASIN OR (CUSTOM PROCEDURE TRAY) ×3 IMPLANT
MANIFOLD NEPTUNE II (INSTRUMENTS) ×3 IMPLANT
NDL SAFETY ECLIPSE 18X1.5 (NEEDLE) ×2 IMPLANT
NEEDLE HYPO 18GX1.5 SHARP (NEEDLE) ×6
NS IRRIG 1000ML POUR BTL (IV SOLUTION) ×3 IMPLANT
PACK TOTAL JOINT (CUSTOM PROCEDURE TRAY) ×3 IMPLANT
PADDING CAST COTTON 6X4 STRL (CAST SUPPLIES) ×8 IMPLANT
POSITIONER SURGICAL ARM (MISCELLANEOUS) ×3 IMPLANT
SET HNDPC FAN SPRY TIP SCT (DISPOSABLE) ×1 IMPLANT
STRIP CLOSURE SKIN 1/2X4 (GAUZE/BANDAGES/DRESSINGS) ×4 IMPLANT
SUCTION FRAZIER 12FR DISP (SUCTIONS) ×3 IMPLANT
SUT MNCRL AB 4-0 PS2 18 (SUTURE) ×3 IMPLANT
SUT VIC AB 2-0 CT1 27 (SUTURE) ×9
SUT VIC AB 2-0 CT1 TAPERPNT 27 (SUTURE) ×3 IMPLANT
SUT VLOC 180 0 24IN GS25 (SUTURE) ×3 IMPLANT
SYR 20CC LL (SYRINGE) ×3 IMPLANT
SYR 50ML LL SCALE MARK (SYRINGE) ×3 IMPLANT
TOWEL OR 17X26 10 PK STRL BLUE (TOWEL DISPOSABLE) ×3 IMPLANT
TOWEL OR NON WOVEN STRL DISP B (DISPOSABLE) ×2 IMPLANT
TRAY FOLEY CATH 14FRSI W/METER (CATHETERS) ×3 IMPLANT
WATER STERILE IRR 1500ML POUR (IV SOLUTION) ×3 IMPLANT
WRAP KNEE MAXI GEL POST OP (GAUZE/BANDAGES/DRESSINGS) ×3 IMPLANT

## 2014-04-25 NOTE — Progress Notes (Signed)
Utilization review completed.  

## 2014-04-25 NOTE — Transfer of Care (Signed)
Immediate Anesthesia Transfer of Care Note  Patient: Lorraine Brandt  Procedure(s) Performed: Procedure(s): RIGHT TOTAL KNEE ARTHROPLASTY (Right)  Patient Location: PACU  Anesthesia Type:Spinal  Level of Consciousness: awake, alert  and oriented  Airway & Oxygen Therapy: Patient Spontanous Breathing and Patient connected to face mask oxygen  Post-op Assessment: Report given to PACU RN, Post -op Vital signs reviewed and stable and SAB level - able to feel and wiggle toes.  Post vital signs: Reviewed and stable  Complications: No apparent anesthesia complications

## 2014-04-25 NOTE — Evaluation (Signed)
Physical Therapy Evaluation Patient Details Name: Lorraine Brandt MRN: [id number] DOB: January 23, 1934 Today's Date: 04/25/2014   History of Present Illness  80 yo female s/p R TKA 04/25/14  Clinical Impression  On eval, pt required Min assist for mobility-able to ambulate ~15 feet with RW. Anticipate pt will progress well during hospital stay.     Follow Up Recommendations Home health PT    Equipment Recommendations  Rolling walker with 5" wheels    Recommendations for Other Services OT consult     Precautions / Restrictions Precautions Precautions: Fall;Knee Required Braces or Orthoses: Knee Immobilizer - Right Knee Immobilizer - Right: Discontinue once straight leg raise with < 10 degree lag Restrictions Weight Bearing Restrictions: No RLE Weight Bearing: Weight bearing as tolerated      Mobility  Bed Mobility Overal bed mobility: Needs Assistance Bed Mobility: Supine to Sit     Supine to sit: Min assist     General bed mobility comments: Assist for R LE  Transfers Overall transfer level: Needs assistance Equipment used: Rolling walker (2 wheeled) Transfers: Sit to/from Stand Sit to Stand: Min assist         General transfer comment: assist to rise, stabilize, control descent. VCS safety, technique, hand placement  Ambulation/Gait Ambulation/Gait assistance: Min assist Ambulation Distance (Feet): 15 Feet Assistive device: Rolling walker (2 wheeled) Gait Pattern/deviations: Step-to pattern;Step-through pattern;Decreased stride length     General Gait Details: assist to stabilize. vcs safety, technique, sequence, distance from walker. slow gait speed.   Stairs            Wheelchair Mobility    Modified Rankin (Stroke Patients Only)       Balance                                             Pertinent Vitals/Pain Pain Assessment: 0-10 Pain Score: 5  Pain Location: R knee Pain Intervention(s): Monitored during session;Ice  applied;Repositioned    Home Living Family/patient expects to be discharged to:: Private residence Living Arrangements: Spouse/significant other   Type of Home: House Home Access: Stairs to enter Entrance Stairs-Rails: None Technical brewer of Steps: 1 Home Layout: One level Home Equipment: None      Prior Function Level of Independence: Independent               Hand Dominance        Extremity/Trunk Assessment   Upper Extremity Assessment: Defer to OT evaluation           Lower Extremity Assessment: RLE deficits/detail RLE Deficits / Details: hip flex 3-/5, hip abd/add 2/5, moves ankle well    Cervical / Trunk Assessment: Normal  Communication   Communication: No difficulties  Cognition Arousal/Alertness: Awake/alert Behavior During Therapy: WFL for tasks assessed/performed Overall Cognitive Status: Within Functional Limits for tasks assessed                      General Comments      Exercises        Assessment/Plan    PT Assessment Patient needs continued PT services  PT Diagnosis Difficulty walking;Acute pain   PT Problem List Decreased strength;Decreased range of motion;Decreased activity tolerance;Decreased balance;Decreased mobility;Decreased knowledge of use of DME;Pain;Decreased knowledge of precautions  PT Treatment Interventions DME instruction;Gait training;Functional mobility training;Therapeutic activities;Therapeutic exercise;Patient/family education;Balance training   PT Goals (Current goals can be  found in the Care Plan section) Acute Rehab PT Goals Patient Stated Goal: less pain. home PT Goal Formulation: With patient Time For Goal Achievement: 05/02/14 Potential to Achieve Goals: Good    Frequency 7X/week   Barriers to discharge        Co-evaluation               End of Session Equipment Utilized During Treatment: Gait belt;Right knee immobilizer Activity Tolerance: Patient tolerated treatment  well Patient left: in chair;with call bell/phone within reach;with family/visitor present           Time: 9643-8381 PT Time Calculation (min) (ACUTE ONLY): 22 min   Charges:   PT Evaluation $Initial PT Evaluation Tier I: 1 Procedure PT Treatments $Gait Training: 8-22 mins   PT G Codes:          [place] [facility] 04/25/2014, 4:25 PM

## 2014-04-25 NOTE — H&P (View-Only) (Signed)
Lorraine Brandt. Noon DOB: 12/24/1933 Married / Language: English / Race: White Female Date of Admission:  04-25-2014 CC:  Right Knee Pain History of Present Illness ([name] PA-C; 05-04-2014 4:52 PM) The patient is a 80 year old female who comes in for a preoperative History and Physical. The patient is scheduled for a right total knee arthroplasty to be performed by Dr. Dione Plover. Aluisio, MD at [facility] on 04-25-2014. The patient is a 80 year old female who presents  for follow up of their knee. The patient is being followed for their right knee pain and osteoarthritis. Symptoms reported today include: pain and giving way. The patient feels that they are doing poorly and report their pain level to be mild to moderate (varies in severity). Current treatment includes: NSAIDs (Ibuprofen (prn)). The following medication has been used for pain control: antiinflammatory medication (Ibuprofen). The patient presents today following 4 weeks post cortisone injection. Note for "Follow-up Knee": Not alot of relief w/ the cortisone injection. Pain does vary in severity. Really concern about the knee giving way alot. Doesnt want to fall and hurt herself.  She states that her right knee is getting progressively worse. From a pain standpoint it does bother her but she is having more problems with function. Her knee is bowing into progressive valgus and becoming unstable. Even with wearing a brace she feels as though the knee gives out on her. She is concerned that she may fall and injure herself. She is not having any significant swelling in the knee. It does hurt her but the bigger problem is the dysfunction. She is ready to proceed with surgery. They have been treated conservatively in the past for the above stated problem and despite conservative measures, they continue to have progressive pain and severe functional limitations and dysfunction. They have failed non-operative management  including home exercise, medications, and injections. It is felt that they would benefit from undergoing total joint replacement. Risks and benefits of the procedure have been discussed with the patient and they elect to proceed with surgery. There are no active contraindications to surgery such as ongoing infection or rapidly progressive neurological disease.  Problem List/Past Medical (Lorraine Brandt Lorraine Brandt, III PA-C; 04/18/2014 12:27 PM) DDD (degenerative disc disease), cervical (722.4) Impingement Syndrome (726.2) Osteoarthritis, Knee (715.96) Scoliosis, Idiopathic (737.30) Primary osteoarthritis of right knee (M17.11) Right shoulder pain (M25.511) Cervical disc disorder with radiculopathy, mid-cervical region (M50.12) Gastroesophageal Reflux Disease Osteoarthritis High blood pressure Osteoporosis Sleep Apnea Degenerative Disc Disease Hypothyroidism Following surgery and radiation. Thyroid Cancer  Allergies  Hydrocodone-Acetaminophen *ANALGESICS - OPIOID* Itching.  Family History (Lorraine Brandt Lorraine Brandt, III PA-C; 05/04/2014 4:54 PM) Father Deceased. Kidney Disease Mother Deceased. Cancer  Social History (Lorraine Brandt Lorraine Brandt, III PA-C; 2014-05-04 4:54 PM) Cancer mother and grandmother mothers side Cerebrovascular Accident father and grandfather mothers side Number of flights of stairs before winded 2-3 Drug/Alcohol Rehab (Currently) no Alcohol use current drinker; drinks wine; 5-7 per week Current work status retired Hypertension mother and brother Children 3 Illicit drug use no Tobacco use Never smoker. Living situation live with spouse Marital status married Pain Contract no Exercise Exercises daily; does running / walking Drug/Alcohol Rehab (Previously) no Advance Directives Living Will, Healthcare POA  Medication History (Lorraine Brandt L Cristella Stiver, III PA-C; 05-04-14 4:45 PM) Alendronate Sodium (70MG  Tablet, Oral)  Active. PredniSONE (1MG  Tablet, Oral) Active. Omeprazole (20MG  Capsule DR, Oral) Active. Levoxyl (125MCG Tablet, Oral) Active. Hydrochlorothiazide (25MG  Tablet, Oral) Active. Diltiazem HCl Coated Beads (300MG  Capsule  ER 24HR, Oral) Active.  Past Surgical History (Chip Canepa Lorraine Brandt, III PA-C; 04/04/2014 4:48 PM) Spinal Decompression Date: 2005. lower back Dilation and Curettage of Uterus Thyroidectomy; Total Date: 2001. Partial Spinal Surgery Cataract Surgery Date: 2007. bilateral Tonsillectomy Date: 52. Tubal Ligation  Review of Systems (Lorraine Brandt L. Lorraine Brandt III PA-C; 04/04/2014 4:33 PM) General Not Present- Chills, Fatigue, Fever, Memory Loss, Night Sweats, Weight Gain and Weight Loss. Skin Not Present- Eczema, Hives, Itching, Lesions and Rash. HEENT Not Present- Dentures, Double Vision, Headache, Hearing Loss, Tinnitus and Visual Loss. Respiratory Not Present- Allergies, Chronic Cough, Coughing up blood, Shortness of breath at rest and Shortness of breath with exertion. Cardiovascular Not Present- Chest Pain, Difficulty Breathing Lying Down, Murmur, Palpitations, Racing/skipping heartbeats and Swelling. Gastrointestinal Not Present- Abdominal Pain, Bloody Stool, Constipation, Diarrhea, Difficulty Swallowing, Heartburn, Jaundice, Loss of appetitie, Nausea and Vomiting. Female Genitourinary Not Present- Blood in Urine, Discharge, Flank Pain, Incontinence, Painful Urination, Urgency, Urinary frequency, Urinary Retention, Urinating at Night and Weak urinary stream. Musculoskeletal Present- Back Pain and Joint Pain. Not Present- Joint Swelling, Morning Stiffness, Muscle Pain, Muscle Weakness and Spasms. Neurological Not Present- Blackout spells, Difficulty with balance, Dizziness, Paralysis, Tremor and Weakness. Psychiatric Not Present- Insomnia.   Vitals ([name] PA-C; 04/04/2014 4:57 PM) 04/04/2014 4:55 PM Weight: 130 lb Height: 62in Weight  was reported by patient. Height was reported by patient. Body Surface Area: 1.59 m Body Mass Index: 23.78 kg/m  BP: 140/78 (Sitting, Right Arm, Standard)   Physical Exam (Ernest Orr L. Keyaan Lederman III PA-C; 04/04/2014 4:59 PM) General Mental Status -Alert, cooperative and good historian. General Appearance-pleasant, Not in acute distress. Orientation-Oriented X3. Build & Nutrition-Well nourished and Well developed.  Head and Neck Head-normocephalic, atraumatic . Neck Global Assessment - supple, no bruit auscultated on the right, no bruit auscultated on the left.  Eye Vision-Wears corrective lenses. Pupil - Bilateral-Regular and Round. Motion - Bilateral-EOMI.  Chest and Lung Exam Auscultation Breath sounds - clear at anterior chest wall and clear at posterior chest wall. Adventitious sounds - No Adventitious sounds.  Cardiovascular Auscultation Rhythm - Regular rate and rhythm. Heart Sounds - S1 WNL and S2 WNL. Murmurs & Other Heart Sounds - Auscultation of the heart reveals - No Murmurs.  Abdomen Palpation/Percussion Tenderness - Abdomen is non-tender to palpation. Rigidity (guarding) - Abdomen is soft. Auscultation Auscultation of the abdomen reveals - Bowel sounds normal.  Female Genitourinary Note: Not done, not pertinent to present illness   Musculoskeletal Note: On exam, she is alert and oriented in no apparent distress. Her right knee shows a very significant valgus deformity. There is no effusion in the knee. Her right hip has normal motion with no discomfort. Range is 0-125. She does have some pseudo correction back to normal alignment. There is no gross instability about the knee. Her left knee shows no effusion. Slight crepitus on range of motion. Range is 0-135 with no deformity.  RADIOGRAPHS: X-rays show bone on bone arthritis in the lateral and patellofemoral compartments of the right knee with about a 10 degree valgus deformity.  Clinically, valgus is worse than the x-ray shows.   Assessment & Plan (Tasmia Blumer L. Kaylob Wallen III PA-C; 04/04/2014 4:39 PM) Primary osteoarthritis of right knee (M17.11) Note:Plan is for a Right Total Knee Replacement by Dr. Wynelle Link.  Plan is to go home.  PCP - Dr. Cari Caraway  Topical TXA - thyroid cancer  Signed electronically by Joelene Millin, III PA-C

## 2014-04-25 NOTE — Anesthesia Procedure Notes (Signed)
Spinal Patient location during procedure: OR Start time: 04/25/2014 9:24 AM End time: 04/25/2014 9:29 AM Staffing Resident/CRNA: Anne Fu Performed by: resident/CRNA  Preanesthetic Checklist Completed: patient identified, site marked, surgical consent, pre-op evaluation, timeout performed, IV checked, risks and benefits discussed and monitors and equipment checked Spinal Block Patient position: sitting Prep: Betadine Patient monitoring: heart rate, continuous pulse ox and blood pressure Approach: left paramedian Location: L2-3 Injection technique: single-shot Needle Needle type: Spinocan  Needle gauge: 22 G Needle length: 9 cm Assessment Sensory level: T6 Additional Notes Expiration date of kit checked and confirmed. Patient tolerated procedure well, without complications.

## 2014-04-25 NOTE — Interval H&P Note (Signed)
History and Physical Interval Note:  04/25/2014 8:36 AM  Lorraine Brandt  has presented today for surgery, with the diagnosis of right knee osteoarthritis  The various methods of treatment have been discussed with the patient and family. After consideration of risks, benefits and other options for treatment, the patient has consented to  Procedure(s): RIGHT TOTAL KNEE ARTHROPLASTY (Right) as a surgical intervention .  The patient's history has been reviewed, patient examined, no change in status, stable for surgery.  I have reviewed the patient's chart and labs.  Questions were answered to the patient's satisfaction.     Gearlean Alf

## 2014-04-25 NOTE — Op Note (Signed)
Pre-operative diagnosis- Osteoarthritis  Right knee(s)  Post-operative diagnosis- Osteoarthritis Right knee(s)  Procedure-  Right  Total Knee Arthroplasty  Surgeon- Dione Plover. Logyn Kendrick, MD  Assistant- Ardeen Jourdain, PA-C   Anesthesia-  Spinal  EBL-* No blood loss amount entered *   Drains Hemovac  Tourniquet time-  Total Tourniquet Time Documented: Thigh (Right) - 27 minutes Total: Thigh (Right) - 27 minutes     Complications- None  Condition-PACU - hemodynamically stable.   Brief Clinical Note  Lorraine Brandt is a 80 y.o. year old female with end stage OA of her right knee with progressively worsening pain and dysfunction. She has constant pain, with activity and at rest and significant functional deficits with difficulties even with ADLs. She has had extensive non-op management including analgesics, injections of cortisone, and home exercise program, but remains in significant pain with significant BrandtRadiographs show bone on bone arthritis lateral and patellofemoral. She presents now for right Total Knee Arthroplasty.    Procedure in detail---   The patient is brought into the operating room and positioned supine on the operating table. After successful administration of  Spinal,   a tourniquet is placed high on the  Right thigh(s) and the lower extremity is prepped and draped in the usual sterile fashion. Time out is performed by the operating team and then the  Right lower extremity is wrapped in Esmarch, knee flexed and the tourniquet inflated to 300 mmHg.       A midline incision is made with a ten blade through the subcutaneous tissue to the level of the extensor mechanism. A fresh blade is used to make a medial parapatellar arthrotomy. Soft tissue over the proximal medial tibia is subperiosteally elevated to the joint line with a knife and into the semimembranosus bursa with a Cobb elevator. Soft tissue over the proximal lateral tibia is elevated with attention being  paid to avoiding the patellar tendon on the tibial tubercle. The patella is everted, knee flexed 90 degrees and the ACL and PCL are removed. Findings are bone on bone lateral and patellofemoral with large lateral osteophytes.        The drill is used to create a starting hole in the distal femur and the canal is thoroughly irrigated with sterile saline to remove the fatty contents. The 5 degree Right  valgus alignment guide is placed into the femoral canal and the distal femoral cutting block is pinned to remove 10 mm off the distal femur. Resection is made with an oscillating saw.      The tibia is subluxed forward and the menisci are removed. The extramedullary alignment guide is placed referencing proximally at the medial aspect of the tibial tubercle and distally along the second metatarsal axis and tibial crest. The block is pinned to remove 67mm off the more deficient lateral  side. Resection is made with an oscillating saw. Size 2is the most appropriate size for the tibia and the proximal tibia is prepared with the modular drill and keel punch for that size.      The femoral sizing guide is placed and size 2 is most appropriate. Rotation is marked off the epicondylar axis and confirmed by creating a rectangular flexion gap at 90 degrees. The size 2 cutting block is pinned in this rotation and the anterior, posterior and chamfer cuts are made with the oscillating saw. The intercondylar block is then placed and that cut is made.      Trial size 2 tibial component, trial size 2  posterior stabilized femur and a 12.5  mm posterior stabilized rotating platform insert trial is placed. Full extension is achieved with excellent varus/valgus and anterior/posterior balance throughout full range of motion. The patella is everted and thickness measured to be 22  mm. Free hand resection is taken to 12 mm, a 35 template is placed, lug holes are drilled, trial patella is placed, and it tracks normally. Osteophytes are  removed off the posterior femur with the trial in place. All trials are removed and the cut bone surfaces prepared with pulsatile lavage. Cement is mixed and once ready for implantation, the size 2 tibial implant, size  2 posterior stabilized femoral component, and the size 35 patella are cemented in place and the patella is held with the clamp. The trial insert is placed and the knee held in full extension. The Exparel (20 ml mixed with 30 ml saline) and .25% Bupivicaine, are injected into the extensor mechanism, posterior capsule, medial and lateral gutters and subcutaneous tissues.  All extruded cement is removed and once the cement is hard the permanent 12.5 mm posterior stabilized rotating platform insert is placed into the tibial tray.      The wound is copiously irrigated with saline solution and the extensor mechanism closed over a hemovac drain with #1 V-loc suture. The tourniquet is released for a total tourniquet time of 27  minutes. Flexion against gravity is 140 degrees and the patella tracks normally. Subcutaneous tissue is closed with 2.0 vicryl and subcuticular with running 4.0 Monocryl. The incision is cleaned and dried and steri-strips and a bulky sterile dressing are applied. The limb is placed into a knee immobilizer and the patient is awakened and transported to recovery in stable condition.      Please note that a surgical assistant was a medical necessity for this procedure in order to perform it in a safe and expeditious manner. Surgical assistant was necessary to retract the ligaments and vital neurovascular structures to prevent injury to them and also necessary for proper positioning of the limb to allow for anatomic placement of the prosthesis.   Dione Plover Ada Holness, MD    04/25/2014, 10:23 AM

## 2014-04-25 NOTE — Anesthesia Postprocedure Evaluation (Signed)
Anesthesia Post Note  Patient: Lorraine Brandt  Procedure(s) Performed: Procedure(s) (LRB): RIGHT TOTAL KNEE ARTHROPLASTY (Right)  Anesthesia type: Spinal  Patient location: PACU  Post pain: Pain level controlled  Post assessment: Post-op Vital signs reviewed  Last Vitals: BP 123/58 mmHg  Pulse 72  Temp(Src) 36.8 C (Oral)  Resp 14  Ht 5' 2.25" (1.581 m)  Wt 130 lb (58.968 kg)  BMI 23.59 kg/m2  SpO2 96%  Post vital signs: Reviewed  Level of consciousness: sedated  Complications: No apparent anesthesia complications

## 2014-04-25 NOTE — Anesthesia Preprocedure Evaluation (Addendum)
Anesthesia Evaluation  Patient identified by MRN, date of birth, ID band Patient awake    Reviewed: Allergy & Precautions, H&P , NPO status , Patient's Chart, lab work & pertinent test results  Airway Mallampati: II  TM Distance: >3 FB Neck ROM: Full    Dental no notable dental hx.    Pulmonary sleep apnea ,  breath sounds clear to auscultation  Pulmonary exam normal       Cardiovascular hypertension, Pt. on medications Rhythm:Regular Rate:Normal     Neuro/Psych negative neurological ROS  negative psych ROS   GI/Hepatic negative GI ROS, Neg liver ROS,   Endo/Other  Hypothyroidism   Renal/GU negative Renal ROS  negative genitourinary   Musculoskeletal  (+) Arthritis -,   Abdominal   Peds negative pediatric ROS (+)  Hematology negative hematology ROS (+)   Anesthesia Other Findings   Reproductive/Obstetrics negative OB ROS                             Anesthesia Physical Anesthesia Plan  ASA: II  Anesthesia Plan: Spinal   Post-op Pain Management:    Induction:   Airway Management Planned:   Additional Equipment:   Intra-op Plan:   Post-operative Plan:   Informed Consent: I have reviewed the patients History and Physical, chart, labs and discussed the procedure including the risks, benefits and alternatives for the proposed anesthesia with the patient or authorized representative who has indicated his/her understanding and acceptance.   Dental advisory given  Plan Discussed with: CRNA  Anesthesia Plan Comments:         Anesthesia Quick Evaluation

## 2014-04-26 ENCOUNTER — Encounter (HOSPITAL_COMMUNITY): Payer: Self-pay | Admitting: Orthopedic Surgery

## 2014-04-26 LAB — URINE MICROSCOPIC-ADD ON

## 2014-04-26 LAB — URINALYSIS, ROUTINE W REFLEX MICROSCOPIC
Bilirubin Urine: NEGATIVE
GLUCOSE, UA: NEGATIVE mg/dL
Ketones, ur: NEGATIVE mg/dL
Nitrite: NEGATIVE
Protein, ur: NEGATIVE mg/dL
Specific Gravity, Urine: 1.009 (ref 1.005–1.030)
Urobilinogen, UA: 0.2 mg/dL (ref 0.0–1.0)
pH: 6 (ref 5.0–8.0)

## 2014-04-26 LAB — BASIC METABOLIC PANEL
ANION GAP: 11 (ref 5–15)
BUN: 10 mg/dL (ref 6–23)
CHLORIDE: 92 meq/L — AB (ref 96–112)
CO2: 26 mEq/L (ref 19–32)
Calcium: 8.4 mg/dL (ref 8.4–10.5)
Creatinine, Ser: 0.53 mg/dL (ref 0.50–1.10)
GFR calc non Af Amer: 89 mL/min — ABNORMAL LOW (ref >90)
Glucose, Bld: 201 mg/dL — ABNORMAL HIGH (ref 70–99)
POTASSIUM: 3.7 meq/L (ref 3.7–5.3)
Sodium: 129 mEq/L — ABNORMAL LOW (ref 137–147)

## 2014-04-26 LAB — CBC
HCT: 34.6 % — ABNORMAL LOW (ref 36.0–46.0)
Hemoglobin: 11.9 g/dL — ABNORMAL LOW (ref 12.0–15.0)
MCH: 31.1 pg (ref 26.0–34.0)
MCHC: 34.4 g/dL (ref 30.0–36.0)
MCV: 90.3 fL (ref 78.0–100.0)
PLATELETS: 313 10*3/uL (ref 150–400)
RBC: 3.83 MIL/uL — AB (ref 3.87–5.11)
RDW: 12.7 % (ref 11.5–15.5)
WBC: 14.3 10*3/uL — AB (ref 4.0–10.5)

## 2014-04-26 MED ORDER — LEVOTHYROXINE SODIUM 125 MCG PO TABS
125.0000 ug | ORAL_TABLET | Freq: Every day | ORAL | Status: DC
  Administered 2014-04-27 – 2014-04-28 (×2): 125 ug via ORAL
  Filled 2014-04-26 (×3): qty 1

## 2014-04-26 NOTE — Plan of Care (Signed)
Problem: Consults Goal: Diagnosis- Total Joint Replacement Outcome: Completed/Met Date Met:  04/26/14 Primary Total Knee RIGHT

## 2014-04-26 NOTE — Care Management Note (Addendum)
    Page 2 of 2   04/28/2014     2:49:44 PM CARE MANAGEMENT NOTE 04/28/2014  Patient:  Lorraine Brandt, Lorraine Brandt   Account Number:  [id number]  Date Initiated:  04/26/2014  Documentation initiated by:  [facility]  Subjective/Objective Assessment:   adm: RIGHT TOTAL KNEE ARTHROPLASTY (Right)     Action/Plan:   discharge planning   Anticipated DC Date:  04/28/2014   Anticipated DC Plan:  [place]  CM consult      [facility] Choice  HOME HEALTH   Choice offered to / List presented to:  C-1 Patient   DME arranged  3-N-1  Vassie Moselle      DME agency  [place] arranged  [place]   Status of service:  Completed, signed off Medicare Important Message given?  YES (If response is "NO", the following Medicare IM given date fields will be blank) Date Medicare IM given:  04/28/2014 Medicare IM given by:  [facility] Date Additional Medicare IM given:   Additional Medicare IM given by:    Discharge Disposition:  [place]  Per UR Regulation:  Reviewed for med. necessity/level of care/duration of stay  If discussed at [place] of Stay Meetings, dates discussed:    Comments:  04/28/14 Dessa Phi RN BSN NCM [phone] [place] aware of d/c home w/HHPT ordered.DME has already been delivered by [facility] dme rep.  04/26/14 07:15 CM met with pt in room to offer choice of home health agency.  Pt chooses [facility] to render HHPT.  CM called [facility] DME rep, Lecretia to please deliver 3n1 and rolling walker to room prior to discharge.  Referral called to [facility] for HHPT.  Adress and contact information verified with pt.  No other CM needs were communicated.  Christella Scheuermann, BSN, [place].

## 2014-04-26 NOTE — Evaluation (Signed)
Occupational Therapy Evaluation Patient Details Name: Lorraine Brandt MRN: [id number] DOB: 30-Mar-1934 Today's Date: 04/26/2014    History of Present Illness 80 yo female s/p R TKA 04/25/14   Clinical Impression   Pt up to practice 3in1 transfer with walker. Pt's husband present for education. Will follow to progress ADL independence for return home with spouse.     Follow Up Recommendations  No OT follow up;Supervision/Assistance - 24 hour    Equipment Recommendations  3 in 1 bedside comode (only if spouse not able to obtain a 3in1)    Recommendations for Other Services       Precautions / Restrictions Precautions Precautions: Knee;Fall Required Braces or Orthoses: Knee Immobilizer - Right (pt able to SLR 12/16-no KI used) Knee Immobilizer - Right: Discontinue once straight leg raise with < 10 degree lag Restrictions Weight Bearing Restrictions: No RLE Weight Bearing: Weight bearing as tolerated      Mobility Bed Mobility             Transfers Overall transfer level: Needs assistance Equipment used: Rolling walker (2 wheeled) Transfers: Sit to/from Stand Sit to Stand: Min assist         General transfer comment: verbal cues hand placement and LE management.     Balance                                            ADL Overall ADL's : Needs assistance/impaired Eating/Feeding: Independent;Sitting   Grooming: Set up;Wash/dry hands;Sitting   Upper Body Bathing: Set up;Sitting   Lower Body Bathing: Minimal assistance;Sit to/from stand   Upper Body Dressing : Set up;Sitting   Lower Body Dressing: Moderate assistance;Sit to/from stand   Toilet Transfer: Minimal assistance;Ambulation;BSC;RW   Toileting- Clothing Manipulation and Hygiene: Minimal assistance;Sit to/from stand         General ADL Comments: Discussed AE options with pt and spouse. Pt states spouse can assist with socks, shoes, pants. She has concerns about whether he  can don TED hose and would like to practice here. She had several questions about 3i1n and use in shower so answered questions related to 3in1 and explained OT would practice shower transfer next visit and discuss placement of 3in1 in shower further. Pt with mild dizziness upon standing.      Vision                     Perception     Praxis      Pertinent Vitals/Pain Pain Assessment: 0-10 Pain Score: 4  Pain Location: R knee Pain Descriptors / Indicators: Aching Pain Intervention(s): Repositioned;Ice applied     Hand Dominance     Extremity/Trunk Assessment Upper Extremity Assessment Upper Extremity Assessment: Overall WFL for tasks assessed           Communication Communication Communication: No difficulties   Cognition Arousal/Alertness: Awake/alert Behavior During Therapy: WFL for tasks assessed/performed Overall Cognitive Status: Within Functional Limits for tasks assessed                     General Comments       Exercises       Shoulder Instructions      Home Living Family/patient expects to be discharged to:: Private residence Living Arrangements: Spouse/significant other   Type of Home: House Home Access: Stairs to enter [employer] of Steps: 1 Entrance  Stairs-Rails: None Home Layout: One level     Bathroom Shower/Tub: Occupational psychologist: Handicapped height     Home Equipment: Research scientist (life sciences);Shower seat - built in [employer]: [place]        Prior Functioning/Environment Level of Independence: Independent             OT Diagnosis: Generalized weakness   OT Problem List: Decreased strength;Decreased knowledge of use of DME or AE   OT Treatment/Interventions: Self-care/ADL training;Patient/family education;Therapeutic activities;DME and/or AE instruction    OT Goals(Current goals can be found in the care plan section) Acute Rehab OT Goals Patient Stated Goal: less pain. home OT  Goal Formulation: With patient Time For Goal Achievement: 05/03/14 Potential to Achieve Goals: Good  OT Frequency: Min 2X/week   Barriers to D/C:            Co-evaluation              End of Session Equipment Utilized During Treatment: Rolling walker CPM Right Knee CPM Right Knee: Off  Activity Tolerance: Patient tolerated treatment well Patient left: in chair;with call bell/phone within reach;with family/visitor present   Time: 6270-3500 OT Time Calculation (min): 34 min Charges:  OT General Charges $OT Visit: 1 Procedure OT Evaluation $Initial OT Evaluation Tier I: 1 Procedure OT Treatments $Self Care/Home Management : 8-22 mins $Therapeutic Activity: 8-22 mins G-Codes:    Jules Schick  [phone] 04/26/2014, 10:34 AM

## 2014-04-26 NOTE — Progress Notes (Signed)
   Subjective: 1 Day Post-Op Procedure(s) (LRB): RIGHT TOTAL KNEE ARTHROPLASTY (Right) Patient reports pain as mild.   Patient seen in rounds with Dr. Wynelle Link. Patient is well, but has had some minor complaints of pain in the knee, requiring pain medications We will start therapy today.  Plan is to go Home after hospital stay.  Objective: Vital signs in last 24 hours: Temp:  [97.9 F (36.6 C)-98.6 F (37 C)] 97.9 F (36.6 C) (12/16 1553) Pulse Rate:  [70-84] 84 (12/16 1600) Resp:  [16] 16 (12/16 1553) BP: (122-180)/(57-92) 180/92 mmHg (12/16 1600) SpO2:  [96 %-99 %] 99 % (12/16 1553)  Intake/Output from previous day:  Intake/Output Summary (Last 24 hours) at 04/26/14 1730 Last data filed at 04/26/14 1723  Gross per 24 hour  Intake 2126.25 ml  Output   1867 ml  Net 259.25 ml    Intake/Output this shift: Total I/O In: 480 [P.O.:480] Out: 450 [Urine:450]  Labs:  Recent Labs  04/26/14 0400  HGB 11.9*    Recent Labs  04/26/14 0400  WBC 14.3*  RBC 3.83*  HCT 34.6*  PLT 313    Recent Labs  04/26/14 0400  NA 129*  K 3.7  CL 92*  CO2 26  BUN 10  CREATININE 0.53  GLUCOSE 201*  CALCIUM 8.4   No results for input(s): LABPT, INR in the last 72 hours.  EXAM General - Patient is Alert, Appropriate and Oriented Extremity - Neurovascular intact Sensation intact distally Dorsiflexion/Plantar flexion intact Dressing - dressing C/D/I Motor Function - intact, moving foot and toes well on exam.  Hemovac pulled without difficulty.  Past Medical History  Diagnosis Date  . Hypertension   . Thyroid disease   . Polymyalgia rheumatica   . Arthritis     oa  . Osteoporosis   . DJD (degenerative joint disease)   . Cancer     basal cell skin cancer, multiple areas removed, thryoid  . Hypothyroidism since 2001  . Sleep apnea     uses mouthpiece somnodent, pt wishes to leave mouthpiece at home, pt told moderate severe sleep apnea    Assessment/Plan: 1 Day  Post-Op Procedure(s) (LRB): RIGHT TOTAL KNEE ARTHROPLASTY (Right) Principal Problem:   OA (osteoarthritis) of knee  Estimated body mass index is 23.59 kg/(m^2) as calculated from the following:   Height as of this encounter: 5' 2.25" (1.581 m).   Weight as of this encounter: 58.968 kg (130 lb). Advance diet Up with therapy Plan for discharge tomorrow Discharge home with home health  DVT Prophylaxis - Xarelto Weight-Bearing as tolerated to right leg D/C O2 and Pulse OX and try on Room Air  Arlee Muslim, PA-C Orthopaedic Surgery 04/26/2014, 5:30 PM

## 2014-04-26 NOTE — Discharge Instructions (Addendum)
° °Dr. Frank Aluisio °Total Joint Specialist °[facility] °[address] °[place], [place] [address] °[phone] ° °TOTAL KNEE REPLACEMENT POSTOPERATIVE DIRECTIONS ° ° ° °Knee Rehabilitation, Guidelines Following Surgery  °Results after knee surgery are often greatly improved when you follow the exercise, range of motion and muscle strengthening exercises prescribed by your doctor. Safety measures are also important to protect the knee from further injury. Any time any of these exercises cause you to have increased pain or swelling in your knee joint, decrease the amount until you are comfortable again and slowly increase them. If you have problems or questions, call your caregiver or physical therapist for advice.  ° °HOME CARE INSTRUCTIONS  °Remove items at home which could result in a fall. This includes throw rugs or furniture in walking pathways.  °Continue medications as instructed at time of discharge. °You may have some home medications which will be placed on hold until you complete the course of blood thinner medication.  °You may start showering once you are discharged home but do not submerge the incision under water. Just pat the incision dry and apply a dry gauze dressing on daily. °Walk with walker as instructed.  °You may resume a sexual relationship in one month or when given the OK by  your doctor.  °· Use walker as long as suggested by your caregivers. °· Avoid periods of inactivity such as sitting longer than an hour when not asleep. This helps prevent blood clots.  °You may put full weight on your legs and walk as much as is comfortable.  °You may return to work once you are cleared by your doctor.  °Do not drive a car for 6 weeks or until released by you surgeon.  °· Do not drive while taking narcotics.  °Wear the elastic stockings for three weeks following surgery during the day but you may remove then at night. °Make sure you keep all of your appointments after your  operation with all of your doctors and caregivers. You should call the office at the above phone number and make an appointment for approximately two weeks after the date of your surgery. °Change the dressing daily and reapply a dry dressing each time. °Please pick up a stool softener and laxative for home use as long as you are requiring pain medications. °· ICE to the affected knee every three hours for 30 minutes at a time and then as needed for pain and swelling.  Continue to use ice on the knee for pain and swelling from surgery. You may notice swelling that will progress down to the foot and ankle.  This is normal after surgery.  Elevate the leg when you are not up walking on it.   °It is important for you to complete the blood thinner medication as prescribed by your doctor. °· Continue to use the breathing machine which will help keep your temperature down.  It is common for your temperature to cycle up and down following surgery, especially at night when you are not up moving around and exerting yourself.  The breathing machine keeps your lungs expanded and your temperature down. ° °RANGE OF MOTION AND STRENGTHENING EXERCISES  °Rehabilitation of the knee is important following a knee injury or an operation. After just a few days of immobilization, the muscles of the thigh which control the knee become weakened and shrink (atrophy). Knee exercises are designed to build up the tone and strength of the thigh muscles and to improve knee   motion. Often times heat used for twenty to thirty minutes before working out will loosen up your tissues and help with improving the range of motion but do not use heat for the first two weeks following surgery. These exercises can be done on a training (exercise) mat, on the floor, on a table or on a bed. Use what ever works the best and is most comfortable for you Knee exercises include:  °Leg Lifts - While your knee is still immobilized in a splint or cast, you can do  straight leg raises. Lift the leg to 60 degrees, hold for 3 sec, and slowly lower the leg. Repeat 10-20 times 2-3 times daily. Perform this exercise against resistance later as your knee gets better.  °Quad and Hamstring Sets - Tighten up the muscle on the front of the thigh (Quad) and hold for 5-10 sec. Repeat this 10-20 times hourly. Hamstring sets are done by pushing the foot backward against an object and holding for 5-10 sec. Repeat as with quad sets.  °A rehabilitation program following serious knee injuries can speed recovery and prevent re-injury in the future due to weakened muscles. Contact your doctor or a physical therapist for more information on knee rehabilitation.  ° °SKILLED REHAB INSTRUCTIONS: °If the patient is transferred to a skilled rehab facility following release from the hospital, a list of the current medications will be sent to the facility for the patient to continue.  When discharged from the skilled rehab facility, please have the facility set up the patient's Home Health Physical Therapy prior to being released. Also, the skilled facility will be responsible for providing the patient with their medications at time of release from the facility to include their pain medication, the muscle relaxants, and their blood thinner medication. If the patient is still at the rehab facility at time of the two week follow up appointment, the skilled rehab facility will also need to assist the patient in arranging follow up appointment in our office and any transportation needs. ° °MAKE SURE YOU:  °Understand these instructions.  °Will watch your condition.  °Will get help right away if you are not doing well or get worse.  ° ° °Pick up stool softner and laxative for home use following surgery while on pain medications. °Do not submerge incision under water. °Please use good hand washing techniques while changing dressing each day. °May shower starting three days after surgery. °Please use a clean  towel to pat the incision dry following showers. °Continue to use ice for pain and swelling after surgery. °Do not use any lotions or creams on the incision until instructed by your surgeon. ° °Take Xarelto for two and a half more weeks, then discontinue Xarelto. °Once the patient has completed the blood thinner regimen, then take a Baby 81 mg Aspirin daily for three more weeks. ° ° °Postoperative Constipation Protocol ° °Constipation - defined medically as fewer than three stools per week and severe constipation as less than one stool per week. ° °One of the most common issues patients have following surgery is constipation.  Even if you have a regular bowel pattern at home, your normal regimen is likely to be disrupted due to multiple reasons following surgery.  Combination of anesthesia, postoperative narcotics, change in appetite and fluid intake all can affect your bowels.  In order to avoid complications following surgery, here are some recommendations in order to help you during your recovery period. ° °Colace (docusate) - Pick up an over-the-counter form   form of Colace or another stool softener and take twice a day as long as you are requiring postoperative pain medications.  Take with a full glass of water daily.  If you experience loose stools or diarrhea, hold the colace until you stool forms back up.  If your symptoms do not get better within 1 week or if they get worse, check with your doctor. ° °Dulcolax (bisacodyl) - Pick up over-the-counter and take as directed by the product packaging as needed to assist with the movement of your bowels.  Take with a full glass of water.  Use this product as needed if not relieved by Colace only.  ° °MiraLax (polyethylene glycol) - Pick up over-the-counter to have on hand.  MiraLax is a solution that will increase the amount of water in your bowels to assist with bowel movements.  Take as directed and can mix with a glass of water, juice, soda, coffee, or tea.  Take if  you go more than two days without a movement. °Do not use MiraLax more than once per day. Call your doctor if you are still constipated or irregular after using this medication for 7 days in a row. ° °If you continue to have problems with postoperative constipation, please contact the office for further assistance and recommendations.  If you experience "the worst abdominal pain ever" or develop nausea or vomiting, please contact the office immediatly for further recommendations for treatment. ° °Information on my medicine - XARELTO® (Rivaroxaban) ° °This medication education was reviewed with me or my healthcare representative as part of my discharge preparation.  The pharmacist that spoke with me during my hospital stay was:  WOFFORD, DREW A, RPH ° °Why was Xarelto® prescribed for you? °Xarelto® was prescribed for you to reduce the risk of blood clots forming after orthopedic surgery. The medical term for these abnormal blood clots is venous thromboembolism (VTE). ° °What do you need to know about xarelto® ? °Take your Xarelto® ONCE DAILY at the same time every day. °You may take it either with or without food. ° °If you have difficulty swallowing the tablet whole, you may crush it and mix in applesauce just prior to taking your dose. ° °Take Xarelto® exactly as prescribed by your doctor and DO NOT stop taking Xarelto® without talking to the doctor who prescribed the medication.  Stopping without other VTE prevention medication to take the place of Xarelto® may increase your risk of developing a clot. ° °After discharge, you should have regular check-up appointments with your healthcare provider that is prescribing your Xarelto®.   ° °What do you do if you miss a dose? °If you miss a dose, take it as soon as you remember on the same day then continue your regularly scheduled once daily regimen the next day. Do not take two doses of Xarelto® on the same day.  ° °Important Safety Information °A possible side effect  of Xarelto® is bleeding. You should call your healthcare provider right away if you experience any of the following: °? Bleeding from an injury or your nose that does not stop. °? Unusual colored urine (red or dark brown) or unusual colored stools (red or black). °? Unusual bruising for unknown reasons. °? A serious fall or if you hit your head (even if there is no bleeding). ° °Some medicines may interact with Xarelto® and might increase your risk of bleeding while on Xarelto®. To help avoid this, consult your healthcare provider or pharmacist prior to using any   new prescription or non-prescription medications, including herbals, vitamins, non-steroidal anti-inflammatory drugs (NSAIDs) and supplements. ° °This website has more information on Xarelto®: www.xarelto.com. ° ° ° °

## 2014-04-26 NOTE — Progress Notes (Signed)
Physical Therapy Treatment Patient Details Name: Lorraine Brandt MRN: [id number] DOB: 04-01-1934 Today's Date: 04/26/2014    History of Present Illness 80 yo female s/p R TKA 04/25/14    PT Comments    Progressing with mobility. Slightly increased pain this session but pt tolerated increased activity well. Plan is for home on tomorrow.   Follow Up Recommendations  Home health PT     Equipment Recommendations  Rolling walker with 5" wheels    Recommendations for Other Services OT consult     Precautions / Restrictions Precautions Precautions: Fall;Knee Required Braces or Orthoses: Knee Immobilizer - Right (able to SLF-did not use KI 12/16) Knee Immobilizer - Right: Discontinue once straight leg raise with < 10 degree lag Restrictions Weight Bearing Restrictions: No RLE Weight Bearing: Weight bearing as tolerated    Mobility  Bed Mobility Overal bed mobility: Needs Assistance Bed Mobility: Sit to Supine       Sit to supine: Min assist   General bed mobility comments: Assist for R LE  Transfers Overall transfer level: Needs assistance Equipment used: Rolling walker (2 wheeled) Transfers: Sit to/from Stand Sit to Stand: Min assist         General transfer comment: verbal cues hand placement and LE management. assist to steady.  Ambulation/Gait Ambulation/Gait assistance: Min guard Ambulation Distance (Feet): 125 Feet Assistive device: Rolling walker (2 wheeled) Gait Pattern/deviations: Step-to pattern;Decreased stride length;Antalgic     General Gait Details: VCs safety, technique, sequence. close guard for safety    Stairs            Wheelchair Mobility    Modified Rankin (Stroke Patients Only)       Balance                                    Cognition Arousal/Alertness: Awake/alert Behavior During Therapy: WFL for tasks assessed/performed Overall Cognitive Status: Within Functional Limits for tasks assessed                      Exercises      General Comments General comments (skin integrity, edema, etc.): min assist standing clothing management.      Pertinent Vitals/Pain Pain Assessment: 0-10 Pain Score: 6  Pain Location: R knee Pain Descriptors / Indicators: Sore;Aching Pain Intervention(s): Monitored during session;Ice applied    Home Living Family/patient expects to be discharged to:: Private residence Living Arrangements: Spouse/significant other   Type of Home: House Home Access: Stairs to enter Entrance Stairs-Rails: None Home Layout: One level Home Equipment: Adaptive equipment;Shower seat - built in      Prior Function Level of Independence: Independent          PT Goals (current goals can now be found in the care plan section) Acute Rehab PT Goals Patient Stated Goal: less pain. home Progress towards PT goals: Progressing toward goals    Frequency  7X/week    PT Plan Current plan remains appropriate    Co-evaluation             End of Session Equipment Utilized During Treatment: Gait belt Activity Tolerance: Patient tolerated treatment well Patient left: in bed;with call bell/phone within reach;with family/visitor present     Time: 1249-1316 PT Time Calculation (min) (ACUTE ONLY): 27 min  Charges:  $Gait Training: 23-37 mins  G Codes:      Weston Anna, MPT Pager: [phone]

## 2014-04-26 NOTE — Progress Notes (Signed)
Physical Therapy Treatment Patient Details Name: Lorraine Brandt MRN: [id number] DOB: 03-Jun-1933 Today's Date: 04/26/2014    History of Present Illness 80 yo female s/p R TKA 04/25/14    PT Comments    Progressing with mobility. Pt tolerated increased activity well-pain 5/10.   Follow Up Recommendations  Home health PT     Equipment Recommendations  Rolling walker with 5" wheels    Recommendations for Other Services OT consult     Precautions / Restrictions Precautions Precautions: Knee;Fall Required Braces or Orthoses: Knee Immobilizer - Right (pt able to SLR 12/16-no KI used) Knee Immobilizer - Right: Discontinue once straight leg raise with < 10 degree lag Restrictions Weight Bearing Restrictions: No RLE Weight Bearing: Weight bearing as tolerated    Mobility  Bed Mobility Overal bed mobility: Needs Assistance Bed Mobility: Supine to Sit;Sit to Supine     Supine to sit: Min assist     General bed mobility comments: Small amount for trunk to full upright. Increased time. VCs technique  Transfers Overall transfer level: Needs assistance Equipment used: Rolling walker (2 wheeled) Transfers: Sit to/from Stand Sit to Stand: Min assist         General transfer comment: assist to rise, stabilize, control descent. VCS safety, technique, hand placement. Pt unsteady with initial standing-mild posterior lean  Ambulation/Gait Ambulation/Gait assistance: Min assist Ambulation Distance (Feet): 75 Feet Assistive device: Rolling walker (2 wheeled) Gait Pattern/deviations: Decreased stride length;Decreased step length - right;Decreased step length - left;Step-through pattern     General Gait Details: VCs safety, technique, sequence, for pt to try to bend R knee during swing through. Increased lateral sway due to pt keeping legs stiff/straight. slow gait speed. Assist to steady.    Stairs            Wheelchair Mobility    Modified Rankin (Stroke Patients  Only)       Balance                                    Cognition Arousal/Alertness: Awake/alert Behavior During Therapy: WFL for tasks assessed/performed Overall Cognitive Status: Within Functional Limits for tasks assessed                      Exercises Total Joint Exercises Ankle Circles/Pumps: AROM;Both;10 reps;Supine Quad Sets: AROM;Both;10 reps;Supine Heel Slides: AAROM;Right;10 reps;Supine Hip ABduction/ADduction: AAROM;Right;10 reps;AROM;Supine Straight Leg Raises: AROM;Right;10 reps;Supine Goniometric ROM: 10-55 degrees    General Comments        Pertinent Vitals/Pain Pain Assessment: 0-10 Pain Score: 5  Pain Location: R knee Pain Descriptors / Indicators: Aching Pain Intervention(s): Monitored during session;Ice applied    Home Living                      Prior Function            PT Goals (current goals can now be found in the care plan section) Progress towards PT goals: Progressing toward goals    Frequency  7X/week    PT Plan Current plan remains appropriate    Co-evaluation             End of Session Equipment Utilized During Treatment: Gait belt;Right knee immobilizer Activity Tolerance: Patient tolerated treatment well Patient left: in chair;with call bell/phone within reach     Time: 0841-0907 PT Time Calculation (min) (ACUTE ONLY): 26 min  Charges:  $Gait  Training: 8-22 mins $Therapeutic Exercise: 8-22 mins                    G Codes:      Weston Anna, MPT Pager: [phone]

## 2014-04-27 LAB — CBC
HCT: 30.1 % — ABNORMAL LOW (ref 36.0–46.0)
HEMOGLOBIN: 10.5 g/dL — AB (ref 12.0–15.0)
MCH: 31.8 pg (ref 26.0–34.0)
MCHC: 34.9 g/dL (ref 30.0–36.0)
MCV: 91.2 fL (ref 78.0–100.0)
Platelets: 312 10*3/uL (ref 150–400)
RBC: 3.3 MIL/uL — ABNORMAL LOW (ref 3.87–5.11)
RDW: 12.8 % (ref 11.5–15.5)
WBC: 14.8 10*3/uL — AB (ref 4.0–10.5)

## 2014-04-27 LAB — BASIC METABOLIC PANEL WITH GFR
Anion gap: 10 (ref 5–15)
BUN: 11 mg/dL (ref 6–23)
CO2: 31 meq/L (ref 19–32)
Calcium: 8.5 mg/dL (ref 8.4–10.5)
Chloride: 88 meq/L — ABNORMAL LOW (ref 96–112)
Creatinine, Ser: 0.56 mg/dL (ref 0.50–1.10)
GFR calc Af Amer: >90 mL/min
GFR calc non Af Amer: 87 mL/min — ABNORMAL LOW
Glucose, Bld: 116 mg/dL — ABNORMAL HIGH (ref 70–99)
Potassium: 3.4 meq/L — ABNORMAL LOW (ref 3.7–5.3)
Sodium: 129 meq/L — ABNORMAL LOW (ref 137–147)

## 2014-04-27 MED ORDER — TRAMADOL HCL 50 MG PO TABS: 50.0000 mg | ORAL_TABLET | Freq: Four times a day (QID) | ORAL | Status: DC | PRN

## 2014-04-27 MED ORDER — HYDROCODONE-ACETAMINOPHEN 5-325 MG PO TABS: 1.0000 | ORAL_TABLET | Freq: Four times a day (QID) | ORAL | Status: DC | PRN

## 2014-04-27 MED ORDER — OXYCODONE HCL 5 MG PO TABS: 5.0000 mg | ORAL_TABLET | ORAL | Status: DC | PRN

## 2014-04-27 MED ORDER — METHOCARBAMOL 500 MG PO TABS: 500.0000 mg | ORAL_TABLET | Freq: Four times a day (QID) | ORAL | Status: DC | PRN

## 2014-04-27 MED ORDER — HYDROCODONE-ACETAMINOPHEN 5-325 MG PO TABS
1.0000 | ORAL_TABLET | Freq: Four times a day (QID) | ORAL | Status: DC | PRN
  Administered 2014-04-27: 1 via ORAL
  Administered 2014-04-28 (×2): 2 via ORAL
  Filled 2014-04-27: qty 1
  Filled 2014-04-27 (×2): qty 2

## 2014-04-27 MED ORDER — RIVAROXABAN 10 MG PO TABS: 10.0000 mg | ORAL_TABLET | Freq: Every day | ORAL | Status: DC

## 2014-04-27 MED ORDER — POTASSIUM CHLORIDE CRYS ER 20 MEQ PO TBCR
40.0000 meq | EXTENDED_RELEASE_TABLET | ORAL | Status: AC
  Administered 2014-04-27 (×2): 40 meq via ORAL
  Filled 2014-04-27 (×2): qty 2

## 2014-04-27 NOTE — Progress Notes (Signed)
Physical Therapy Treatment Patient Details Name: Lorraine Brandt MRN: [id number] DOB: 04-Nov-1933 Today's Date: 04/27/2014    History of Present Illness 80 yo female s/p R TKA 04/25/14    PT Comments    Pt tolerated session fairly well. Required cues but pt appeared clear during session. Husband present as well-assisted during ambulation. Pt reports increased pain this session-8/10 with activity. Discussed d/c plan-pt states she feels better than this am-states daughter will be assisting at home for a few day as well. Will see for a 2nd session.   Follow Up Recommendations  Home health PT;Supervision/Assistance - 24 hour     Equipment Recommendations  Rolling walker with 5" wheels    Recommendations for Other Services OT consult     Precautions / Restrictions Precautions Precautions: Fall;Knee Required Braces or Orthoses: Knee Immobilizer - Right Knee Immobilizer - Right: Discontinue once straight leg raise with < 10 degree lag Restrictions Weight Bearing Restrictions: No RLE Weight Bearing: Weight bearing as tolerated    Mobility  Bed Mobility               General bed mobility comments: pt OOB in recliner  Transfers Overall transfer level: Needs assistance Equipment used: Rolling walker (2 wheeled) Transfers: Sit to/from Stand Sit to Stand: Min assist         General transfer comment: VCs safety, technique, hand placement/LE placement. Assist to steady.   Ambulation/Gait Ambulation/Gait assistance: Min guard Ambulation Distance (Feet): 70 Feet Assistive device: Rolling walker (2 wheeled) Gait Pattern/deviations: Step-to pattern;Decreased stride length;Antalgic     General Gait Details: VCs safety, technique, sequence. close guard for safety. Husband actually guarded pt and held onto gait belt.    Stairs            Wheelchair Mobility    Modified Rankin (Stroke Patients Only)       Balance                                     Cognition Arousal/Alertness: Awake/alert Behavior During Therapy: WFL for tasks assessed/performed Overall Cognitive Status: Within Functional Limits for tasks assessed Area of Impairment: Following commands;Safety/judgement;Problem solving       Following Commands: Follows one step commands with increased time     Problem Solving: Slow processing;Requires verbal cues;Requires tactile cues General Comments: Pt with noted slower processing today and more verbal and tactile cues needed. Pt stating, "I am not clear today."     Exercises Total Joint Exercises Ankle Circles/Pumps: AROM;Both;10 reps;Seated Quad Sets: AROM;Both;10 reps;Seated Heel Slides: AAROM;Right;10 reps;Seated Hip ABduction/ADduction: AAROM;Right;10 reps;AROM;Seated Straight Leg Raises: AROM;Right;10 reps;Seated Goniometric ROM: 10-45 degrees    General Comments General comments (skin integrity, edema, etc.): min assist to manage clothing.      Pertinent Vitals/Pain Pain Assessment: 0-10 Pain Score: 8  Pain Location: R knee Pain Descriptors / Indicators: Aching;Sore Pain Intervention(s): Monitored during session;Ice applied;Premedicated before session    Home Living                      Prior Function            PT Goals (current goals can now be found in the care plan section) Progress towards PT goals: Progressing toward goals    Frequency  7X/week    PT Plan Current plan remains appropriate    Co-evaluation  End of Session Equipment Utilized During Treatment: Gait belt Activity Tolerance: Patient limited by pain Patient left: in chair;with call bell/phone within reach;with family/visitor present     Time: 1125-1150 PT Time Calculation (min) (ACUTE ONLY): 25 min  Charges:  $Gait Training: 8-22 mins $Therapeutic Exercise: 8-22 mins                    G Codes:      Weston Anna, MPT Pager: [phone]

## 2014-04-27 NOTE — Progress Notes (Addendum)
   Subjective: 2 Days Post-Op Procedure(s) (LRB): RIGHT TOTAL KNEE ARTHROPLASTY (Right) Patient reports pain as moderate and severe.   Patient seen in rounds with Dr. Wynelle Link.  Biggest complaint is her pain issues.  Discussed and encouraged the PO pain meds.  Will see how she does today.  If continues to progress and meets goals, then probably home after the second session of therapy later today. Patient is having problems with pain in the knee, requiring pain medications Patient is ready to go home later today after two sessions if improves.  Objective: Vital signs in last 24 hours: Temp:  [97.9 F (36.6 C)-98.4 F (36.9 C)] 98.4 F (36.9 C) (12/17 0559) Pulse Rate:  [72-84] 72 (12/17 0559) Resp:  [16-17] 16 (12/17 0800) BP: (125-180)/(59-92) 125/61 mmHg (12/17 0559) SpO2:  [98 %-99 %] 98 % (12/17 0559)  Intake/Output from previous day:  Intake/Output Summary (Last 24 hours) at 04/27/14 0832 Last data filed at 04/27/14 0530  Gross per 24 hour  Intake    720 ml  Output   1700 ml  Net   -980 ml      Labs:  Recent Labs  04/26/14 0400 04/27/14 0435  HGB 11.9* 10.5*    Recent Labs  04/26/14 0400 04/27/14 0435  WBC 14.3* 14.8*  RBC 3.83* 3.30*  HCT 34.6* 30.1*  PLT 313 312    Recent Labs  04/26/14 0400 04/27/14 0435  NA 129* 129*  K 3.7 3.4*  CL 92* 88*  CO2 26 31  BUN 10 11  CREATININE 0.53 0.56  GLUCOSE 201* 116*  CALCIUM 8.4 8.5   No results for input(s): LABPT, INR in the last 72 hours.  EXAM: General - Patient is Alert, Appropriate and Oriented Extremity - Neurovascular intact Sensation intact distally Dorsiflexion/Plantar flexion intact Incision - clean, dry, no drainage, healing Motor Function - intact, moving foot and toes well on exam.   Assessment/Plan: 2 Days Post-Op Procedure(s) (LRB): RIGHT TOTAL KNEE ARTHROPLASTY (Right) Procedure(s) (LRB): RIGHT TOTAL KNEE ARTHROPLASTY (Right) Past Medical History  Diagnosis Date  .  Hypertension   . Thyroid disease   . Polymyalgia rheumatica   . Arthritis     oa  . Osteoporosis   . DJD (degenerative joint disease)   . Cancer     basal cell skin cancer, multiple areas removed, thryoid  . Hypothyroidism since 2001  . Sleep apnea     uses mouthpiece somnodent, pt wishes to leave mouthpiece at home, pt told moderate severe sleep apnea   Principal Problem:   OA (osteoarthritis) of knee  Estimated body mass index is 23.59 kg/(m^2) as calculated from the following:   Height as of this encounter: 5' 2.25" (1.581 m).   Weight as of this encounter: 58.968 kg (130 lb). Up with therapy Discharge home with home health after two sessions of therapy Diet - Cardiac diet Follow up - in 2 weeks Activity - WBAT Disposition - Home Condition Upon Discharge - Stable D/C Meds - See DC Summary DVT Prophylaxis - [place], PA-C Orthopaedic Surgery 04/27/2014, 8:32 AM   Addendum Note: Holding Discharge Dr. Wynelle Link spoke to the patient's daughter who informed him of the issues with the strong narcotic medications in the past.  Will reduce the narcotics down to the [facility] and DC the Oxycodone.  Will keep today and monitor for any changes. Possibly home tomorrow. Arlee Muslim, [facility]

## 2014-04-27 NOTE — Progress Notes (Signed)
Occupational Therapy Treatment Patient Details Name: Lorraine Brandt MRN: [id number] DOB: 10/22/1933 Today's Date: 04/27/2014    History of present illness 80 yo female s/p R TKA 04/25/14   OT comments  See notes below but pt is slower with her processing today and reports that she "doesnt feel clear." She is also limited by pain this am. Nursing called to room and made aware of above. Pt not safe to d/c home with spouse currently. Attempted to have spouse assist hands on with care but he does seem apprehensive about providing hands on assist. Pt with several LOB during clothing management tasks. She may benefit from an additional day of therapy versus if she continues to progress slowly, may need SNF.   Follow Up Recommendations  Home health OT;Other (comment) (depends on progress. If not able to progress may need SNF)    Equipment Recommendations  None recommended by OT    Recommendations for Other Services      Precautions / Restrictions Precautions Precautions: Fall;Knee Required Braces or Orthoses: Knee Immobilizer - Right (pt not performing SLR this session) Knee Immobilizer - Right: Discontinue once straight leg raise with < 10 degree lag Restrictions RLE Weight Bearing: Weight bearing as tolerated       Mobility Bed Mobility               General bed mobility comments: nursing tech assisting pt OOB when OT arrived.   Transfers Overall transfer level: Needs assistance Equipment used: Rolling walker (2 wheeled) Transfers: Sit to/from Stand Sit to Stand: Min assist         General transfer comment: verbal cues hand placement and min assist to steady.    Balance                                   ADL                           Toilet Transfer: Minimal assistance;Ambulation;BSC;RW   Toileting- Clothing Manipulation and Hygiene: Minimal assistance;Sit to/from stand         General ADL Comments: Pt stating she doesnt feel clear  with her thinking today. Note pt to be slower with processing and having difficulty with instructions including hand placement, LE management, etc. Called nursing into room to make nursing aware of slower cognition today. Pt also stating she is hurting a lot today compared to yesterday. Pt still wtih several LOB posteriorly with attempts to manage gown at commode and with pulling up her underwear and pants. Husband with limitations of using his thumbs right now but was able to don TED hose with cues. Instructed him to practice hands on with providing standing support for pt to manage clothing in standing but he didnt tend to provide enough physical support and just tends to have hands near her. Explained that she needs hands on support for balance. Pt stating she feels spouse will be too nervous to assist her and he states, "if she falls, I catch her." Used KI this session as pt not able to SLR and instructed both on how to don/doff. Pt asking about SNF option so informed nursing who will discuss with PA. Do not feel pt is ready to d/c home after this session and question if another day of therapy versus SNF needed. Nursing aware. Advised pt if she does go home later today, she should  sponge bathe initially and let HHOT address showering with her. Pt agreeable.      Vision                     Perception     Praxis      Cognition   Behavior During Therapy: [facility] for tasks assessed/performed Overall Cognitive Status: Impaired/Different from baseline Area of Impairment: Following commands;Safety/judgement;Problem solving        Following Commands: Follows one step commands with increased time     Problem Solving: Slow processing;Requires verbal cues;Requires tactile cues General Comments: Pt with noted slower processing today and more verbal and tactile cues needed. Pt stating, "I am not clear today."     Extremity/Trunk Assessment               Exercises     Shoulder  Instructions       General Comments      Pertinent Vitals/ Pain       Pain Assessment: 0-10 Pain Score: 6  Pain Location: r knee Pain Descriptors / Indicators: Aching Pain Intervention(s): Repositioned;Patient requesting pain meds-RN notified  Home Living                                          Prior Functioning/Environment              Frequency Min 2X/week     Progress Toward Goals  OT Goals(current goals can now be found in the care plan section)  Progress towards OT goals: Not progressing toward goals - comment (pt with pain and more confused today)     Plan Discharge plan needs to be updated    Co-evaluation                 End of Session Equipment Utilized During Treatment: Rolling walker;Right knee immobilizer   Activity Tolerance Patient limited by pain;Other (comment) (decreased cognition)   Patient Left in chair;with call bell/phone within reach;with family/visitor present   Nurse Communication          Time: [phone] OT Time Calculation (min): 58 min  Charges: OT General Charges $OT Visit: 1 Procedure OT Treatments $Self Care/Home Management : 23-37 mins $Therapeutic Activity: 23-37 mins  Jules Schick  [phone] 04/27/2014, 10:56 AM

## 2014-04-27 NOTE — Discharge Summary (Signed)
Physician Discharge Summary   Patient ID: [name] MRN: [id number] DOB/AGE: 78-Feb-1935 78 y.o.  Admit date: 04/25/2014 Discharge date: 04-28-2014  Primary Diagnosis:  Osteoarthritis Right knee(s)  Admission Diagnoses:  Past Medical History  Diagnosis Date  . Hypertension   . Thyroid disease   . Polymyalgia rheumatica   . Arthritis     oa  . Osteoporosis   . DJD (degenerative joint disease)   . Cancer     basal cell skin cancer, multiple areas removed, thryoid  . Hypothyroidism since 2001  . Sleep apnea     uses mouthpiece somnodent, pt wishes to leave mouthpiece at home, pt told moderate severe sleep apnea   Discharge Diagnoses:   Principal Problem:   OA (osteoarthritis) of knee  Estimated body mass index is 23.59 kg/(m^2) as calculated from the following:   Height as of this encounter: 5' 2.25" (1.581 m).   Weight as of this encounter: 58.968 kg (130 lb).  Procedure:  Procedure(s) (LRB): RIGHT TOTAL KNEE ARTHROPLASTY (Right)   Consults: None  HPI: Lorraine Brandt is a 80 y.o. year old female with end stage OA of her right knee with progressively worsening pain and dysfunction. She has constant pain, with activity and at rest and significant functional deficits with difficulties even with ADLs. She has had extensive non-op management including analgesics, injections of cortisone, and home exercise program, but remains in significant pain with significant dysfunction.Radiographs show bone on bone arthritis lateral and patellofemoral. She presents now for right Total Knee Arthroplasty.   Laboratory Data: Admission on 04/25/2014, Discharged on 04/28/2014  Component Date Value Ref Range Status  . ABO/RH(D) 04/25/2014 B POS   Final  . Antibody Screen 04/25/2014 NEG   Final  . Sample Expiration 04/25/2014 04/28/2014   Final  . ABO/RH(D) 04/25/2014 B POS   Final  . WBC 04/26/2014 14.3* 4.0 - 10.5 K/uL Final  . RBC 04/26/2014 3.83* 3.87 - 5.11 MIL/uL Final  .  Hemoglobin 04/26/2014 11.9* 12.0 - 15.0 g/dL Final  . HCT 04/26/2014 34.6* 36.0 - 46.0 % Final  . MCV 04/26/2014 90.3  78.0 - 100.0 fL Final  . MCH 04/26/2014 31.1  26.0 - 34.0 pg Final  . MCHC 04/26/2014 34.4  30.0 - 36.0 g/dL Final  . RDW 04/26/2014 12.7  11.5 - 15.5 % Final  . Platelets 04/26/2014 313  150 - 400 K/uL Final  . Sodium 04/26/2014 129* 137 - 147 mEq/L Final  . Potassium 04/26/2014 3.7  3.7 - 5.3 mEq/L Final  . Chloride 04/26/2014 92* 96 - 112 mEq/L Final  . CO2 04/26/2014 26  19 - 32 mEq/L Final  . Glucose, Bld 04/26/2014 201* 70 - 99 mg/dL Final  . BUN 04/26/2014 10  6 - 23 mg/dL Final  . Creatinine, Ser 04/26/2014 0.53  0.50 - 1.10 mg/dL Final  . Calcium 04/26/2014 8.4  8.4 - 10.5 mg/dL Final  . GFR calc non Af Amer 04/26/2014 89* >90 mL/min Final  . GFR calc Af Amer 04/26/2014 >90  >90 mL/min Final   Comment: (NOTE) The eGFR has been calculated using the CKD EPI equation. This calculation has not been validated in all clinical situations. eGFR's persistently <90 mL/min signify possible Chronic Kidney Disease.   . Anion gap 04/26/2014 11  5 - 15 Final  . WBC 04/27/2014 14.8* 4.0 - 10.5 K/uL Final  . RBC 04/27/2014 3.30* 3.87 - 5.11 MIL/uL Final  . Hemoglobin 04/27/2014 10.5* 12.0 - 15.0 g/dL Final  .  HCT 04/27/2014 30.1* 36.0 - 46.0 % Final  . MCV 04/27/2014 91.2  78.0 - 100.0 fL Final  . MCH 04/27/2014 31.8  26.0 - 34.0 pg Final  . MCHC 04/27/2014 34.9  30.0 - 36.0 g/dL Final  . RDW 04/27/2014 12.8  11.5 - 15.5 % Final  . Platelets 04/27/2014 312  150 - 400 K/uL Final  . Sodium 04/27/2014 129* 137 - 147 mEq/L Final  . Potassium 04/27/2014 3.4* 3.7 - 5.3 mEq/L Final  . Chloride 04/27/2014 88* 96 - 112 mEq/L Final  . CO2 04/27/2014 31  19 - 32 mEq/L Final  . Glucose, Bld 04/27/2014 116* 70 - 99 mg/dL Final  . BUN 04/27/2014 11  6 - 23 mg/dL Final  . Creatinine, Ser 04/27/2014 0.56  0.50 - 1.10 mg/dL Final  . Calcium 04/27/2014 8.5  8.4 - 10.5 mg/dL Final  .  GFR calc non Af Amer 04/27/2014 87* >90 mL/min Final  . GFR calc Af Amer 04/27/2014 >90  >90 mL/min Final   Comment: (NOTE) The eGFR has been calculated using the CKD EPI equation. This calculation has not been validated in all clinical situations. eGFR's persistently <90 mL/min signify possible Chronic Kidney Disease.   . Anion gap 04/27/2014 10  5 - 15 Final  . Color, Urine 04/26/2014 YELLOW  YELLOW Final  . APPearance 04/26/2014 CLEAR  CLEAR Final  . Specific Gravity, Urine 04/26/2014 1.009  1.005 - 1.030 Final  . pH 04/26/2014 6.0  5.0 - 8.0 Final  . Glucose, UA 04/26/2014 NEGATIVE  NEGATIVE mg/dL Final  . Hgb urine dipstick 04/26/2014 TRACE* NEGATIVE Final  . Bilirubin Urine 04/26/2014 NEGATIVE  NEGATIVE Final  . Ketones, ur 04/26/2014 NEGATIVE  NEGATIVE mg/dL Final  . Protein, ur 04/26/2014 NEGATIVE  NEGATIVE mg/dL Final  . Urobilinogen, UA 04/26/2014 0.2  0.0 - 1.0 mg/dL Final  . Nitrite 04/26/2014 NEGATIVE  NEGATIVE Final  . Leukocytes, UA 04/26/2014 SMALL* NEGATIVE Final  . Squamous Epithelial / LPF 04/26/2014 FEW* RARE Final  . WBC, UA 04/26/2014 3-6  <3 WBC/hpf Final  . RBC / HPF 04/26/2014 3-6  <3 RBC/hpf Final  . Bacteria, UA 04/26/2014 RARE  RARE Final  . Urine-Other 04/26/2014 RARE YEAST   Final  . WBC 04/28/2014 12.2* 4.0 - 10.5 K/uL Final  . RBC 04/28/2014 3.07* 3.87 - 5.11 MIL/uL Final  . Hemoglobin 04/28/2014 9.8* 12.0 - 15.0 g/dL Final  . HCT 04/28/2014 27.5* 36.0 - 46.0 % Final  . MCV 04/28/2014 89.6  78.0 - 100.0 fL Final  . MCH 04/28/2014 31.9  26.0 - 34.0 pg Final  . MCHC 04/28/2014 35.6  30.0 - 36.0 g/dL Final  . RDW 04/28/2014 13.0  11.5 - 15.5 % Final  . Platelets 04/28/2014 258  150 - 400 K/uL Final  Hospital Outpatient Visit on 04/19/2014  Component Date Value Ref Range Status  . aPTT 04/19/2014 26  24 - 37 seconds Final  . WBC 04/19/2014 10.0  4.0 - 10.5 K/uL Final  . RBC 04/19/2014 4.46  3.87 - 5.11 MIL/uL Final  . Hemoglobin 04/19/2014 13.6   12.0 - 15.0 g/dL Final  . HCT 04/19/2014 40.1  36.0 - 46.0 % Final  . MCV 04/19/2014 89.9  78.0 - 100.0 fL Final  . MCH 04/19/2014 30.5  26.0 - 34.0 pg Final  . MCHC 04/19/2014 33.9  30.0 - 36.0 g/dL Final  . RDW 04/19/2014 12.6  11.5 - 15.5 % Final  . Platelets 04/19/2014 371  150 - 400  K/uL Final  . Sodium 04/19/2014 136* 137 - 147 mEq/L Final  . Potassium 04/19/2014 5.1  3.7 - 5.3 mEq/L Final  . Chloride 04/19/2014 94* 96 - 112 mEq/L Final  . CO2 04/19/2014 29  19 - 32 mEq/L Final  . Glucose, Bld 04/19/2014 99  70 - 99 mg/dL Final  . BUN 04/19/2014 17  6 - 23 mg/dL Final  . Creatinine, Ser 04/19/2014 0.67  0.50 - 1.10 mg/dL Final  . Calcium 04/19/2014 10.1  8.4 - 10.5 mg/dL Final  . Total Protein 04/19/2014 7.3  6.0 - 8.3 g/dL Final  . Albumin 04/19/2014 4.1  3.5 - 5.2 g/dL Final  . AST 04/19/2014 20  0 - 37 U/L Final  . ALT 04/19/2014 14  0 - 35 U/L Final  . Alkaline Phosphatase 04/19/2014 71  39 - 117 U/L Final  . Total Bilirubin 04/19/2014 0.4  0.3 - 1.2 mg/dL Final  . GFR calc non Af Amer 04/19/2014 82* >90 mL/min Final  . GFR calc Af Amer 04/19/2014 >90  >90 mL/min Final   Comment: (NOTE) The eGFR has been calculated using the CKD EPI equation. This calculation has not been validated in all clinical situations. eGFR's persistently <90 mL/min signify possible Chronic Kidney Disease.   . Anion gap 04/19/2014 13  5 - 15 Final  . Prothrombin Time 04/19/2014 12.6  11.6 - 15.2 seconds Final  . INR 04/19/2014 0.94  0.00 - 1.49 Final  . Color, Urine 04/19/2014 YELLOW  YELLOW Final  . APPearance 04/19/2014 CLOUDY* CLEAR Final  . Specific Gravity, Urine 04/19/2014 1.012  1.005 - 1.030 Final  . pH 04/19/2014 7.5  5.0 - 8.0 Final  . Glucose, UA 04/19/2014 NEGATIVE  NEGATIVE mg/dL Final  . Hgb urine dipstick 04/19/2014 NEGATIVE  NEGATIVE Final  . Bilirubin Urine 04/19/2014 NEGATIVE  NEGATIVE Final  . Ketones, ur 04/19/2014 NEGATIVE  NEGATIVE mg/dL Final  . Protein, ur 04/19/2014  NEGATIVE  NEGATIVE mg/dL Final  . Urobilinogen, UA 04/19/2014 0.2  0.0 - 1.0 mg/dL Final  . Nitrite 04/19/2014 NEGATIVE  NEGATIVE Final  . Leukocytes, UA 04/19/2014 TRACE* NEGATIVE Final  . MRSA, PCR 04/19/2014 NEGATIVE  NEGATIVE Final  . Staphylococcus aureus 04/19/2014 NEGATIVE  NEGATIVE Final   Comment:        The Xpert SA Assay (FDA approved for NASAL specimens in patients over 7 years of age), is one component of a comprehensive surveillance program.  Test performance has been validated by [employer] for patients greater than or equal to 81 year old. It is not intended to diagnose infection nor to guide or monitor treatment.   . Squamous Epithelial / LPF 04/19/2014 RARE  RARE Final  . WBC, UA 04/19/2014 0-2  <3 WBC/hpf Final  . Bacteria, UA 04/19/2014 RARE  RARE Final     X-Rays:No results found.  EKG: Orders placed or performed during the hospital encounter of 06/23/11  . ED EKG  . ED EKG  . EKG     Hospital Course: Lorraine Brandt is a 80 y.o. who was admitted to [facility]. They were brought to the operating room on 04/25/2014 and underwent Procedure(s): RIGHT TOTAL KNEE ARTHROPLASTY.  Patient tolerated the procedure well and was later transferred to the recovery room and then to the orthopaedic floor for postoperative care.  They were given PO and IV analgesics for pain control following their surgery.  They were given 24 hours of postoperative antibiotics of  Anti-infectives    Start  Dose/Rate Route Frequency Ordered Stop   04/25/14 1600  ceFAZolin (ANCEF) IVPB 1 g/50 mL premix     1 g100 mL/hr over 30 Minutes Intravenous Every 6 hours 04/25/14 1306 04/26/14 0006   04/25/14 0735  ceFAZolin (ANCEF) IVPB 2 g/50 mL premix     2 g100 mL/hr over 30 Minutes Intravenous On call to O.R. 04/25/14 1761 04/25/14 0943     and started on DVT prophylaxis in the form of Xarelto.   PT and OT were ordered for total joint protocol.  Discharge planning consulted to  help with postop disposition and equipment needs.  Patient had a decent night on the evening of surgery.  They started to get up OOB with therapy on day one. Hemovac drain was pulled without difficulty.  Continued to work with therapy into day two.  Dressing was changed on day two and the incision was healing well.  Biggest complaint was her pain issues.  Dr. Wynelle Link spoke to the patient's daughter who informed him of the issues with the strong narcotic medications in the past. Reduced the narcotics down to the [facility] and DC the Oxycodone and held discharge.  By day three, the patient had progressed with therapy, feeling better and meeting their goals.  Incision was healing well.  Patient was seen in rounds and was ready to go home.  Discharge home with home health Diet - Cardiac diet Follow up - in 2 weeks Activity - WBAT Disposition - Home Condition Upon Discharge - Stable D/C Meds - See DC Summary DVT Prophylaxis - Xarelto      Discharge Instructions    Call MD / Call 911    Complete by:  As directed   If you experience chest pain or shortness of breath, CALL 911 and be transported to the hospital emergency room.  If you develope a fever above 101 F, pus (white drainage) or increased drainage or redness at the wound, or calf pain, call your surgeon's office.     Change dressing    Complete by:  As directed   Change dressing daily with sterile 4 x 4 inch gauze dressing and apply TED hose. Do not submerge the incision under water.     Constipation Prevention    Complete by:  As directed   Drink plenty of fluids.  Prune juice may be helpful.  You may use a stool softener, such as Colace (over the counter) 100 mg twice a day.  Use MiraLax (over the counter) for constipation as needed.     Diet - low sodium heart healthy    Complete by:  As directed      Discharge instructions    Complete by:  As directed   Pick up stool softner and laxative for home use following surgery while on pain  medications. Do not submerge incision under water. Please use good hand washing techniques while changing dressing each day. May shower starting three days after surgery. Please use a clean towel to pat the incision dry following showers. Continue to use ice for pain and swelling after surgery. Do not use any lotions or creams on the incision until instructed by your surgeon.  Take Xarelto for two and a half more weeks, then discontinue Xarelto. Once the patient has completed the blood thinner regimen, then take a Baby 81 mg Aspirin daily for three more weeks.  Postoperative Constipation Protocol  Constipation - defined medically as fewer than three stools per week and severe constipation as less than one  stool per week.  One of the most common issues patients have following surgery is constipation.  Even if you have a regular bowel pattern at home, your normal regimen is likely to be disrupted due to multiple reasons following surgery.  Combination of anesthesia, postoperative narcotics, change in appetite and fluid intake all can affect your bowels.  In order to avoid complications following surgery, here are some recommendations in order to help you during your recovery period.  Colace (docusate) - Pick up an over-the-counter form of Colace or another stool softener and take twice a day as long as you are requiring postoperative pain medications.  Take with a full glass of water daily.  If you experience loose stools or diarrhea, hold the colace until you stool forms back up.  If your symptoms do not get better within 1 week or if they get worse, check with your doctor.  Dulcolax (bisacodyl) - Pick up over-the-counter and take as directed by the product packaging as needed to assist with the movement of your bowels.  Take with a full glass of water.  Use this product as needed if not relieved by Colace only.   MiraLax (polyethylene glycol) - Pick up over-the-counter to have on hand.  MiraLax  is a solution that will increase the amount of water in your bowels to assist with bowel movements.  Take as directed and can mix with a glass of water, juice, soda, coffee, or tea.  Take if you go more than two days without a movement. Do not use MiraLax more than once per day. Call your doctor if you are still constipated or irregular after using this medication for 7 days in a row.  If you continue to have problems with postoperative constipation, please contact the office for further assistance and recommendations.  If you experience "the worst abdominal pain ever" or develop nausea or vomiting, please contact the office immediatly for further recommendations for treatment.     Do not put a pillow under the knee. Place it under the heel.    Complete by:  As directed      Do not sit on low chairs, stoools or toilet seats, as it may be difficult to get up from low surfaces    Complete by:  As directed      Driving restrictions    Complete by:  As directed   No driving until released by the physician.     Increase activity slowly as tolerated    Complete by:  As directed      Lifting restrictions    Complete by:  As directed   No lifting until released by the physician.     Patient may shower    Complete by:  As directed   You may shower without a dressing once there is no drainage.  Do not wash over the wound.  If drainage remains, do not shower until drainage stops.     TED hose    Complete by:  As directed   Use stockings (TED hose) for 3 weeks on both leg(s).  You may remove them at night for sleeping.     Weight bearing as tolerated    Complete by:  As directed             Medication List    STOP taking these medications        alendronate 70 MG tablet  Commonly known as:  FOSAMAX     CALCIUM PO  FISH OIL PO     glucosamine-chondroitin 500-400 MG tablet     multivitamin with minerals Tabs tablet      TAKE these medications        diltiazem 300 MG 24 hr capsule    Commonly known as:  CARDIZEM CD  Take 300 mg by mouth every morning.  Notes to Patient:  If systolic is less than      hydrochlorothiazide 25 MG tablet  Commonly known as:  HYDRODIURIL  Take 25 mg by mouth every morning.     HYDROcodone-acetaminophen 5-325 MG per tablet  Commonly known as:  NORCO/VICODIN  Take 1-2 tablets by mouth every 6 (six) hours as needed for moderate pain.     levothyroxine 125 MCG tablet  Commonly known as:  SYNTHROID, LEVOTHROID  Take 125 mcg by mouth every morning. On an empty stomach.     methocarbamol 500 MG tablet  Commonly known as:  ROBAXIN  Take 1 tablet (500 mg total) by mouth every 6 (six) hours as needed for muscle spasms.  Notes to Patient:  Muscle Relaxer     omeprazole 20 MG capsule  Commonly known as:  PRILOSEC  Take 20 mg by mouth every morning.     predniSONE 1 MG tablet  Commonly known as:  DELTASONE  Take 1 mg by mouth every morning.     rivaroxaban 10 MG Tabs tablet  Commonly known as:  XARELTO  - Take 1 tablet (10 mg total) by mouth daily with breakfast. Take Xarelto for two and a half more weeks, then discontinue Xarelto.  - Once the patient has completed the blood thinner regimen, then take a Baby 81 mg Aspirin daily for three more weeks.  Notes to Patient:  Blood thinner     traMADol 50 MG tablet  Commonly known as:  ULTRAM  Take 1-2 tablets (50-100 mg total) by mouth every 6 (six) hours as needed (mild pain).       Follow-up Information    Follow up with [facility].   Why:  home health physical therapy   Contact information:   [place] [address] [phone]       Follow up with [facility].   Why:  3n1 (commode) and rolling walker   Contact information:   [address] [place] [address] [phone]       Follow up with Gearlean Alf, MD. Schedule an appointment as soon as possible for a visit in 2 weeks.   Specialty:  Orthopedic Surgery    Why:  Call office at [phone] to set up appointment with Dr. Wynelle Link on Dec. 28th or 29th.   Contact information:   [address] [place] [address] [phone]       Signed: Arlee Muslim, PA-C Orthopaedic Surgery 05/25/2014, 9:42 AM

## 2014-04-27 NOTE — Progress Notes (Signed)
Physical Therapy Treatment Patient Details Name: Lorraine Brandt MRN: [id number] DOB: [birth year] Today's Date: 04/27/2014    History of Present Illness 80 yo female s/p R TKA 04/25/14    PT Comments    Pt participated well with 2nd session. Practiced ambulation and stair negotiation-tolerated well. Pt/husband and RN report pt will be staying until tomorrow. Will see in am prior to d/c. Pt requesting CPM this afternoon-made ortho tech Jenny Reichmann) aware.   Follow Up Recommendations  Home health PT;Supervision/Assistance - 24 hour     Equipment Recommendations  Rolling walker with 5" wheels    Recommendations for Other Services OT consult     Precautions / Restrictions Precautions Precautions: Fall;Knee Required Braces or Orthoses: Knee Immobilizer - Right Knee Immobilizer - Right: Discontinue once straight leg raise with < 10 degree lag Restrictions Weight Bearing Restrictions: No RLE Weight Bearing: Weight bearing as tolerated    Mobility  Bed Mobility Overal bed mobility: Needs Assistance Bed Mobility: Sit to Supine       Sit to supine: Min assist   General bed mobility comments: Assist for R LE  Transfers Overall transfer level: Needs assistance Equipment used: Rolling walker (2 wheeled) Transfers: Sit to/from Stand Sit to Stand: Min assist         General transfer comment: VCs safety, technique, hand placement/LE placement. Assist to steady.   Ambulation/Gait Ambulation/Gait assistance: Min guard Ambulation Distance (Feet): 60 Feet Assistive device: Rolling walker (2 wheeled) Gait Pattern/deviations: Step-to pattern;Decreased stride length;Trunk flexed;Antalgic     General Gait Details: VCs safety, technique, sequence. close guard for safety. Husband actually guarded pt and held onto gait belt.    Stairs Stairs: Yes Stairs assistance: Min assist Stair Management: Step to pattern;Backwards;With walker Number of Stairs: 1 General stair comments: VCS  safety, technique, sequence. Assist to safely perform. Husband present, observed as well.   Wheelchair Mobility    Modified Rankin (Stroke Patients Only)       Balance                                    Cognition Arousal/Alertness: Awake/alert Behavior During Therapy: WFL for tasks assessed/performed Overall Cognitive Status: Within Functional Limits for tasks assessed                      Exercises    General Comments        Pertinent Vitals/Pain Pain Assessment: 0-10 Pain Score: 8  Pain Location: R knee Pain Descriptors / Indicators: Aching;Sore Pain Intervention(s): Monitored during session;Ice applied;Repositioned    Home Living                      Prior Function            PT Goals (current goals can now be found in the care plan section) Progress towards PT goals: Progressing toward goals    Frequency  7X/week    PT Plan Current plan remains appropriate    Co-evaluation             End of Session Equipment Utilized During Treatment: Gait belt;Right knee immobilizer Activity Tolerance: Patient tolerated treatment well Patient left: in bed;with call bell/phone within reach;with family/visitor present     Time: 7106-2694 PT Time Calculation (min) (ACUTE ONLY): 31 min  Charges:  $Gait Training: 8-22 mins $Therapeutic Exercise: 8-22 mins $Therapeutic Activity: 8-22 mins  G Codes:      Weston Anna, MPT Pager: [phone]

## 2014-04-28 LAB — CBC
HEMATOCRIT: 27.5 % — AB (ref 36.0–46.0)
HEMOGLOBIN: 9.8 g/dL — AB (ref 12.0–15.0)
MCH: 31.9 pg (ref 26.0–34.0)
MCHC: 35.6 g/dL (ref 30.0–36.0)
MCV: 89.6 fL (ref 78.0–100.0)
Platelets: 258 10*3/uL (ref 150–400)
RBC: 3.07 MIL/uL — ABNORMAL LOW (ref 3.87–5.11)
RDW: 13 % (ref 11.5–15.5)
WBC: 12.2 10*3/uL — ABNORMAL HIGH (ref 4.0–10.5)

## 2014-04-28 MED ORDER — TRAMADOL HCL 50 MG PO TABS: 50.0000 mg | ORAL_TABLET | Freq: Four times a day (QID) | ORAL | Status: DC | PRN

## 2014-04-28 MED ORDER — HYDROCODONE-ACETAMINOPHEN 5-325 MG PO TABS: 1.0000 | ORAL_TABLET | Freq: Four times a day (QID) | ORAL | Status: DC | PRN

## 2014-04-28 MED ORDER — RIVAROXABAN 10 MG PO TABS: 10.0000 mg | ORAL_TABLET | Freq: Every day | ORAL | Status: DC

## 2014-04-28 MED ORDER — METHOCARBAMOL 500 MG PO TABS
500.0000 mg | ORAL_TABLET | Freq: Four times a day (QID) | ORAL | Status: DC | PRN
Start: 2014-04-28 — End: 2014-10-17

## 2014-04-28 NOTE — Progress Notes (Signed)
Physical Therapy Treatment Patient Details Name: Lorraine Brandt MRN: [id number] DOB: 07/19/1933 Today's Date: 04/28/2014    History of Present Illness 80 yo female s/p R TKA 04/25/14    PT Comments    Pt appears better today. Tolerated session fairly well. Practiced ambulation and exercises. Pt denied need to practice stair negotiation again on today.   Follow Up Recommendations  Home health PT;Supervision/Assistance - 24 hour     Equipment Recommendations  Rolling walker with 5" wheels    Recommendations for Other Services OT consult     Precautions / Restrictions Precautions Precautions: Knee;Fall Required Braces or Orthoses: Knee Immobilizer - Right Knee Immobilizer - Right: Discontinue once straight leg raise with < 10 degree lag Restrictions Weight Bearing Restrictions: No RLE Weight Bearing: Weight bearing as tolerated    Mobility  Bed Mobility Overal bed mobility: Needs Assistance Bed Mobility: Supine to Sit     Supine to sit: Min assist     General bed mobility comments: Assist for R LE  Transfers Overall transfer level: Needs assistance Equipment used: Rolling walker (2 wheeled) Transfers: Sit to/from Stand Sit to Stand: Min guard         General transfer comment: VCs safety, technique, hand placement/LE placement. close guard for safety  Ambulation/Gait Ambulation/Gait assistance: Min guard Ambulation Distance (Feet): 75 Feet Assistive device: Rolling walker (2 wheeled) Gait Pattern/deviations: Step-through pattern;Step-to pattern;Decreased stride length;Antalgic     General Gait Details: VCs safety, technique, sequence. close guard for safety. Pt tends to take small step through steps.   Stairs            Wheelchair Mobility    Modified Rankin (Stroke Patients Only)       Balance                                    Cognition Arousal/Alertness: Awake/alert Behavior During Therapy: WFL for tasks  assessed/performed Overall Cognitive Status: Within Functional Limits for tasks assessed                      Exercises Total Joint Exercises Ankle Circles/Pumps: AROM;Both;10 reps;Seated Quad Sets: AROM;Both;10 reps;Seated Heel Slides: AAROM;Right;10 reps;Seated Hip ABduction/ADduction: AAROM;Right;10 reps;AROM;Seated Straight Leg Raises: AROM;Right;10 reps;Seated Goniometric ROM: 10-40 degrees (some difficulty with ROM this session)    General Comments        Pertinent Vitals/Pain Pain Assessment: Faces Faces Pain Scale: Hurts even more Pain Location: R knee Pain Descriptors / Indicators: Aching;Sore Pain Intervention(s): Monitored during session;Ice applied    Home Living                      Prior Function            PT Goals (current goals can now be found in the care plan section) Progress towards PT goals: Progressing toward goals    Frequency  7X/week    PT Plan Current plan remains appropriate    Co-evaluation             End of Session Equipment Utilized During Treatment: Gait belt;Right knee immobilizer Activity Tolerance: Patient limited by pain Patient left: in chair;with call bell/phone within reach;with family/visitor present     Time: 0912-0938 PT Time Calculation (min) (ACUTE ONLY): 26 min  Charges:  $Gait Training: 8-22 mins $Therapeutic Exercise: 8-22 mins  G Codes:      Weston Anna, MPT Pager: [phone]

## 2014-04-28 NOTE — Progress Notes (Signed)
   Subjective: 3 Days Post-Op Procedure(s) (LRB): RIGHT TOTAL KNEE ARTHROPLASTY (Right) Patient reports pain as mild.   Patient seen in rounds for Dr. Wynelle Link.  Feeling better today.  Pain more controlled with Vicodin and ultram.  Taking benadryl with the Vicodin. Patient is well, but has had some minor complaints of pain in the knee, requiring pain medications Patient is ready to go home after therapy.  Objective: Vital signs in last 24 hours: Temp:  [98.3 F (36.8 C)-98.4 F (36.9 C)] 98.3 F (36.8 C) (12/18 0619) Pulse Rate:  [80-90] 90 (12/18 0619) Resp:  [16] 16 (12/18 0619) BP: (123-154)/(55-66) 138/55 mmHg (12/18 0619) SpO2:  [94 %-97 %] 94 % (12/18 0619)  Intake/Output from previous day:  Intake/Output Summary (Last 24 hours) at 04/28/14 0802 Last data filed at 04/27/14 1841  Gross per 24 hour  Intake    720 ml  Output      0 ml  Net    720 ml    Intake/Output this shift:    Labs:  Recent Labs  04/26/14 0400 04/27/14 0435 04/28/14 0530  HGB 11.9* 10.5* 9.8*    Recent Labs  04/27/14 0435 04/28/14 0530  WBC 14.8* 12.2*  RBC 3.30* 3.07*  HCT 30.1* 27.5*  PLT 312 258    Recent Labs  04/26/14 0400 04/27/14 0435  NA 129* 129*  K 3.7 3.4*  CL 92* 88*  CO2 26 31  BUN 10 11  CREATININE 0.53 0.56  GLUCOSE 201* 116*  CALCIUM 8.4 8.5   No results for input(s): LABPT, INR in the last 72 hours.  EXAM: General - Patient is Alert and Appropriate Extremity - Neurovascular intact Sensation intact distally Incision - clean, dry, no drainage Motor Function - intact, moving foot and toes well on exam.   Assessment/Plan: 3 Days Post-Op Procedure(s) (LRB): RIGHT TOTAL KNEE ARTHROPLASTY (Right) Procedure(s) (LRB): RIGHT TOTAL KNEE ARTHROPLASTY (Right) Past Medical History  Diagnosis Date  . Hypertension   . Thyroid disease   . Polymyalgia rheumatica   . Arthritis     oa  . Osteoporosis   . DJD (degenerative joint disease)   . Cancer     basal  cell skin cancer, multiple areas removed, thryoid  . Hypothyroidism since 2001  . Sleep apnea     uses mouthpiece somnodent, pt wishes to leave mouthpiece at home, pt told moderate severe sleep apnea   Principal Problem:   OA (osteoarthritis) of knee  Estimated body mass index is 23.59 kg/(m^2) as calculated from the following:   Height as of this encounter: 5' 2.25" (1.581 m).   Weight as of this encounter: 58.968 kg (130 lb). Up with therapy Discharge home with home health Diet - Cardiac diet Follow up - in 2 weeks Activity - WBAT Disposition - Home Condition Upon Discharge - Stable D/C Meds - See DC Summary DVT Prophylaxis - Xarelto  Arlee Muslim, PA-C Orthopaedic Surgery 04/28/2014, 8:02 AM

## 2014-04-28 NOTE — Plan of Care (Signed)
Problem: Phase III Progression Outcomes Goal: Anticoagulant follow-up in place Outcome: Not Applicable Date Met:  52/08/02 Xarelto VTE, no f/u needed.

## 2014-04-28 NOTE — Progress Notes (Signed)
Occupational Therapy Treatment Patient Details Name: Lorraine Brandt MRN: [id number] DOB: 05-16-1933 Today's Date: 04/28/2014    History of present illness 80 yo female s/p R TKA 04/25/14   OT comments  Pt requested to take shower when practicing transfer.  Husband assisted with LB dressing today.  Educated on positioning 3:1 in shower stall  Follow Up Recommendations  Home health OT    Equipment Recommendations  None recommended by OT    Recommendations for Other Services      Precautions / Restrictions Precautions Precautions: Knee;Fall Required Braces or Orthoses: Knee Immobilizer - Right Knee Immobilizer - Right: Discontinue once straight leg raise with < 10 degree lag Restrictions Weight Bearing Restrictions: No RLE Weight Bearing: Weight bearing as tolerated       Mobility Bed Mobility              Transfers Overall transfer level: Needs assistance Equipment used: Rolling walker (2 wheeled) Transfers: Sit to/from Stand Sit to Stand: Min guard         General transfer comment: vcs for safety, hand placement    Balance                                   ADL               Lower Body Bathing: Moderate assistance;Sit to/from stand       Lower Body Dressing: Moderate assistance;Sit to/from stand   Toilet Transfer: Min guard;Ambulation;BSC;RW   Toileting- Water quality scientist and Hygiene: Min guard;Sit to/from stand   Tub/ Shower Transfer: Walk-in shower;Minimal assistance;Ambulation;3 in 1     General ADL Comments: performed shower. Seat high so increased assistance needed for LB.  Talked to pt/husband about getting long netted sponge on a stick for home and having lower 3:1 height.  Husband assisted with sit to stand and donning pants      Vision                     Perception     Praxis      Cognition   Behavior During Therapy: WFL for tasks assessed/performed Overall Cognitive Status: Within Functional  Limits for tasks assessed                       Extremity/Trunk Assessment               Exercises    Shoulder Instructions       General Comments      Pertinent Vitals/ Pain      Pain Score: 5  Faces Pain Scale: Hurts even more Pain Location: R knee Pain Descriptors / Indicators: Aching Pain Intervention(s): Limited activity within patient's tolerance;Monitored during session;Premedicated before session;Repositioned;Ice applied  Home Living                                          Prior Functioning/Environment              Frequency       Progress Toward Goals  OT Goals(current goals can now be found in the care plan section)  Progress towards OT goals: Progressing toward goals     Plan      Co-evaluation  End of Session     Activity Tolerance Patient tolerated treatment well   Patient Left in chair;with call bell/phone within reach;with family/visitor present   Nurse Communication          Time: 2202-5427 OT Time Calculation (min): 40 min  Charges: OT General Charges $OT Visit: 1 Procedure OT Treatments $Self Care/Home Management : 38-52 mins  Lorraine Brandt 04/28/2014, 11:28 AM  Lorraine Brandt, OTR/L [phone] 04/28/2014

## 2014-04-29 DIAGNOSIS — Z471 Aftercare following joint replacement surgery: Secondary | ICD-10-CM | POA: Diagnosis not present

## 2014-04-29 DIAGNOSIS — M4120 Other idiopathic scoliosis, site unspecified: Secondary | ICD-10-CM | POA: Diagnosis not present

## 2014-04-29 DIAGNOSIS — M5030 Other cervical disc degeneration, unspecified cervical region: Secondary | ICD-10-CM | POA: Diagnosis not present

## 2014-04-29 DIAGNOSIS — I10 Essential (primary) hypertension: Secondary | ICD-10-CM | POA: Diagnosis not present

## 2014-04-29 DIAGNOSIS — M1990 Unspecified osteoarthritis, unspecified site: Secondary | ICD-10-CM | POA: Diagnosis not present

## 2014-04-29 DIAGNOSIS — M810 Age-related osteoporosis without current pathological fracture: Secondary | ICD-10-CM | POA: Diagnosis not present

## 2014-05-01 DIAGNOSIS — M1990 Unspecified osteoarthritis, unspecified site: Secondary | ICD-10-CM | POA: Diagnosis not present

## 2014-05-01 DIAGNOSIS — M5030 Other cervical disc degeneration, unspecified cervical region: Secondary | ICD-10-CM | POA: Diagnosis not present

## 2014-05-01 DIAGNOSIS — M810 Age-related osteoporosis without current pathological fracture: Secondary | ICD-10-CM | POA: Diagnosis not present

## 2014-05-01 DIAGNOSIS — Z471 Aftercare following joint replacement surgery: Secondary | ICD-10-CM | POA: Diagnosis not present

## 2014-05-01 DIAGNOSIS — I10 Essential (primary) hypertension: Secondary | ICD-10-CM | POA: Diagnosis not present

## 2014-05-01 DIAGNOSIS — M4120 Other idiopathic scoliosis, site unspecified: Secondary | ICD-10-CM | POA: Diagnosis not present

## 2014-05-02 DIAGNOSIS — M5030 Other cervical disc degeneration, unspecified cervical region: Secondary | ICD-10-CM | POA: Diagnosis not present

## 2014-05-02 DIAGNOSIS — M4120 Other idiopathic scoliosis, site unspecified: Secondary | ICD-10-CM | POA: Diagnosis not present

## 2014-05-02 DIAGNOSIS — M1990 Unspecified osteoarthritis, unspecified site: Secondary | ICD-10-CM | POA: Diagnosis not present

## 2014-05-02 DIAGNOSIS — M810 Age-related osteoporosis without current pathological fracture: Secondary | ICD-10-CM | POA: Diagnosis not present

## 2014-05-02 DIAGNOSIS — Z471 Aftercare following joint replacement surgery: Secondary | ICD-10-CM | POA: Diagnosis not present

## 2014-05-02 DIAGNOSIS — I10 Essential (primary) hypertension: Secondary | ICD-10-CM | POA: Diagnosis not present

## 2014-05-04 DIAGNOSIS — M810 Age-related osteoporosis without current pathological fracture: Secondary | ICD-10-CM | POA: Diagnosis not present

## 2014-05-04 DIAGNOSIS — M5030 Other cervical disc degeneration, unspecified cervical region: Secondary | ICD-10-CM | POA: Diagnosis not present

## 2014-05-04 DIAGNOSIS — Z471 Aftercare following joint replacement surgery: Secondary | ICD-10-CM | POA: Diagnosis not present

## 2014-05-04 DIAGNOSIS — M1990 Unspecified osteoarthritis, unspecified site: Secondary | ICD-10-CM | POA: Diagnosis not present

## 2014-05-04 DIAGNOSIS — I10 Essential (primary) hypertension: Secondary | ICD-10-CM | POA: Diagnosis not present

## 2014-05-04 DIAGNOSIS — M4120 Other idiopathic scoliosis, site unspecified: Secondary | ICD-10-CM | POA: Diagnosis not present

## 2014-05-08 DIAGNOSIS — M1990 Unspecified osteoarthritis, unspecified site: Secondary | ICD-10-CM | POA: Diagnosis not present

## 2014-05-08 DIAGNOSIS — M810 Age-related osteoporosis without current pathological fracture: Secondary | ICD-10-CM | POA: Diagnosis not present

## 2014-05-08 DIAGNOSIS — M5030 Other cervical disc degeneration, unspecified cervical region: Secondary | ICD-10-CM | POA: Diagnosis not present

## 2014-05-08 DIAGNOSIS — M4120 Other idiopathic scoliosis, site unspecified: Secondary | ICD-10-CM | POA: Diagnosis not present

## 2014-05-08 DIAGNOSIS — I10 Essential (primary) hypertension: Secondary | ICD-10-CM | POA: Diagnosis not present

## 2014-05-08 DIAGNOSIS — Z471 Aftercare following joint replacement surgery: Secondary | ICD-10-CM | POA: Diagnosis not present

## 2014-05-10 DIAGNOSIS — I10 Essential (primary) hypertension: Secondary | ICD-10-CM | POA: Diagnosis not present

## 2014-05-10 DIAGNOSIS — Z471 Aftercare following joint replacement surgery: Secondary | ICD-10-CM | POA: Diagnosis not present

## 2014-05-10 DIAGNOSIS — M810 Age-related osteoporosis without current pathological fracture: Secondary | ICD-10-CM | POA: Diagnosis not present

## 2014-05-10 DIAGNOSIS — M4120 Other idiopathic scoliosis, site unspecified: Secondary | ICD-10-CM | POA: Diagnosis not present

## 2014-05-10 DIAGNOSIS — M5030 Other cervical disc degeneration, unspecified cervical region: Secondary | ICD-10-CM | POA: Diagnosis not present

## 2014-05-10 DIAGNOSIS — M1990 Unspecified osteoarthritis, unspecified site: Secondary | ICD-10-CM | POA: Diagnosis not present

## 2014-05-11 DIAGNOSIS — Z471 Aftercare following joint replacement surgery: Secondary | ICD-10-CM | POA: Diagnosis not present

## 2014-05-11 DIAGNOSIS — M810 Age-related osteoporosis without current pathological fracture: Secondary | ICD-10-CM | POA: Diagnosis not present

## 2014-05-11 DIAGNOSIS — M5030 Other cervical disc degeneration, unspecified cervical region: Secondary | ICD-10-CM | POA: Diagnosis not present

## 2014-05-11 DIAGNOSIS — M1990 Unspecified osteoarthritis, unspecified site: Secondary | ICD-10-CM | POA: Diagnosis not present

## 2014-05-11 DIAGNOSIS — M4120 Other idiopathic scoliosis, site unspecified: Secondary | ICD-10-CM | POA: Diagnosis not present

## 2014-05-11 DIAGNOSIS — I10 Essential (primary) hypertension: Secondary | ICD-10-CM | POA: Diagnosis not present

## 2014-05-15 DIAGNOSIS — M1711 Unilateral primary osteoarthritis, right knee: Secondary | ICD-10-CM | POA: Diagnosis not present

## 2014-05-18 DIAGNOSIS — M1711 Unilateral primary osteoarthritis, right knee: Secondary | ICD-10-CM | POA: Diagnosis not present

## 2014-05-22 DIAGNOSIS — M1711 Unilateral primary osteoarthritis, right knee: Secondary | ICD-10-CM | POA: Diagnosis not present

## 2014-05-25 DIAGNOSIS — M1711 Unilateral primary osteoarthritis, right knee: Secondary | ICD-10-CM | POA: Diagnosis not present

## 2014-05-29 DIAGNOSIS — M1711 Unilateral primary osteoarthritis, right knee: Secondary | ICD-10-CM | POA: Diagnosis not present

## 2014-05-30 DIAGNOSIS — Z96651 Presence of right artificial knee joint: Secondary | ICD-10-CM | POA: Diagnosis not present

## 2014-05-30 DIAGNOSIS — Z471 Aftercare following joint replacement surgery: Secondary | ICD-10-CM | POA: Diagnosis not present

## 2014-06-01 DIAGNOSIS — M1711 Unilateral primary osteoarthritis, right knee: Secondary | ICD-10-CM | POA: Diagnosis not present

## 2014-06-05 DIAGNOSIS — M1711 Unilateral primary osteoarthritis, right knee: Secondary | ICD-10-CM | POA: Diagnosis not present

## 2014-06-06 ENCOUNTER — Ambulatory Visit: Payer: Medicare Other

## 2014-06-07 ENCOUNTER — Ambulatory Visit
Admission: RE | Admit: 2014-06-07 | Discharge: 2014-06-07 | Disposition: A | Discharge status: Home / Self Care | Payer: Medicare Other | Source: Ambulatory Visit

## 2014-06-07 DIAGNOSIS — Z1231 Encounter for screening mammogram for malignant neoplasm of breast: Secondary | ICD-10-CM

## 2014-06-08 DIAGNOSIS — M1711 Unilateral primary osteoarthritis, right knee: Secondary | ICD-10-CM | POA: Diagnosis not present

## 2014-06-12 DIAGNOSIS — M1711 Unilateral primary osteoarthritis, right knee: Secondary | ICD-10-CM | POA: Diagnosis not present

## 2014-06-16 DIAGNOSIS — M1711 Unilateral primary osteoarthritis, right knee: Secondary | ICD-10-CM | POA: Diagnosis not present

## 2014-06-19 DIAGNOSIS — M1711 Unilateral primary osteoarthritis, right knee: Secondary | ICD-10-CM | POA: Diagnosis not present

## 2014-06-22 DIAGNOSIS — M1711 Unilateral primary osteoarthritis, right knee: Secondary | ICD-10-CM | POA: Diagnosis not present

## 2014-06-24 DIAGNOSIS — R300 Dysuria: Secondary | ICD-10-CM | POA: Diagnosis not present

## 2014-06-27 DIAGNOSIS — N952 Postmenopausal atrophic vaginitis: Secondary | ICD-10-CM | POA: Diagnosis not present

## 2014-06-27 DIAGNOSIS — R3915 Urgency of urination: Secondary | ICD-10-CM | POA: Diagnosis not present

## 2014-06-29 DIAGNOSIS — M1711 Unilateral primary osteoarthritis, right knee: Secondary | ICD-10-CM | POA: Diagnosis not present

## 2014-07-03 DIAGNOSIS — L570 Actinic keratosis: Secondary | ICD-10-CM | POA: Diagnosis not present

## 2014-07-03 DIAGNOSIS — Z85828 Personal history of other malignant neoplasm of skin: Secondary | ICD-10-CM | POA: Diagnosis not present

## 2014-07-03 DIAGNOSIS — M1711 Unilateral primary osteoarthritis, right knee: Secondary | ICD-10-CM | POA: Diagnosis not present

## 2014-07-04 DIAGNOSIS — Z96651 Presence of right artificial knee joint: Secondary | ICD-10-CM | POA: Diagnosis not present

## 2014-07-04 DIAGNOSIS — Z471 Aftercare following joint replacement surgery: Secondary | ICD-10-CM | POA: Diagnosis not present

## 2014-07-06 DIAGNOSIS — M1711 Unilateral primary osteoarthritis, right knee: Secondary | ICD-10-CM | POA: Diagnosis not present

## 2014-07-07 DIAGNOSIS — R350 Frequency of micturition: Secondary | ICD-10-CM | POA: Diagnosis not present

## 2014-07-07 DIAGNOSIS — N9089 Other specified noninflammatory disorders of vulva and perineum: Secondary | ICD-10-CM | POA: Diagnosis not present

## 2014-07-14 DIAGNOSIS — M1711 Unilateral primary osteoarthritis, right knee: Secondary | ICD-10-CM | POA: Diagnosis not present

## 2014-07-17 DIAGNOSIS — M1711 Unilateral primary osteoarthritis, right knee: Secondary | ICD-10-CM | POA: Diagnosis not present

## 2014-08-16 DIAGNOSIS — N8111 Cystocele, midline: Secondary | ICD-10-CM | POA: Diagnosis not present

## 2014-08-16 DIAGNOSIS — N812 Incomplete uterovaginal prolapse: Secondary | ICD-10-CM | POA: Diagnosis not present

## 2014-08-24 DIAGNOSIS — Z471 Aftercare following joint replacement surgery: Secondary | ICD-10-CM | POA: Diagnosis not present

## 2014-08-24 DIAGNOSIS — Z96651 Presence of right artificial knee joint: Secondary | ICD-10-CM | POA: Diagnosis not present

## 2014-09-11 DIAGNOSIS — I10 Essential (primary) hypertension: Secondary | ICD-10-CM | POA: Diagnosis not present

## 2014-09-11 DIAGNOSIS — Z1389 Encounter for screening for other disorder: Secondary | ICD-10-CM | POA: Diagnosis not present

## 2014-09-11 DIAGNOSIS — N814 Uterovaginal prolapse, unspecified: Secondary | ICD-10-CM | POA: Diagnosis not present

## 2014-09-11 DIAGNOSIS — Z23 Encounter for immunization: Secondary | ICD-10-CM | POA: Diagnosis not present

## 2014-09-11 DIAGNOSIS — M353 Polymyalgia rheumatica: Secondary | ICD-10-CM | POA: Diagnosis not present

## 2014-09-11 DIAGNOSIS — Z85850 Personal history of malignant neoplasm of thyroid: Secondary | ICD-10-CM | POA: Diagnosis not present

## 2014-09-11 DIAGNOSIS — G4733 Obstructive sleep apnea (adult) (pediatric): Secondary | ICD-10-CM | POA: Diagnosis not present

## 2014-09-11 DIAGNOSIS — M810 Age-related osteoporosis without current pathological fracture: Secondary | ICD-10-CM | POA: Diagnosis not present

## 2014-09-11 DIAGNOSIS — N952 Postmenopausal atrophic vaginitis: Secondary | ICD-10-CM | POA: Diagnosis not present

## 2014-09-11 DIAGNOSIS — E890 Postprocedural hypothyroidism: Secondary | ICD-10-CM | POA: Diagnosis not present

## 2014-09-11 DIAGNOSIS — K210 Gastro-esophageal reflux disease with esophagitis: Secondary | ICD-10-CM | POA: Diagnosis not present

## 2014-09-12 DIAGNOSIS — N8111 Cystocele, midline: Secondary | ICD-10-CM | POA: Diagnosis not present

## 2014-09-12 DIAGNOSIS — N812 Incomplete uterovaginal prolapse: Secondary | ICD-10-CM | POA: Diagnosis not present

## 2014-09-20 DIAGNOSIS — N8111 Cystocele, midline: Secondary | ICD-10-CM | POA: Diagnosis not present

## 2014-09-21 DIAGNOSIS — N8111 Cystocele, midline: Secondary | ICD-10-CM | POA: Diagnosis not present

## 2014-10-11 DIAGNOSIS — M7061 Trochanteric bursitis, right hip: Secondary | ICD-10-CM | POA: Diagnosis not present

## 2014-10-11 DIAGNOSIS — Z96651 Presence of right artificial knee joint: Secondary | ICD-10-CM | POA: Diagnosis not present

## 2014-10-11 DIAGNOSIS — Z471 Aftercare following joint replacement surgery: Secondary | ICD-10-CM | POA: Diagnosis not present

## 2014-10-11 DIAGNOSIS — M7661 Achilles tendinitis, right leg: Secondary | ICD-10-CM | POA: Diagnosis not present

## 2014-10-11 DIAGNOSIS — M7651 Patellar tendinitis, right knee: Secondary | ICD-10-CM | POA: Diagnosis not present

## 2014-10-17 ENCOUNTER — Other Ambulatory Visit: Payer: Self-pay | Admitting: Obstetrics & Gynecology

## 2014-10-25 DIAGNOSIS — H04123 Dry eye syndrome of bilateral lacrimal glands: Secondary | ICD-10-CM | POA: Diagnosis not present

## 2014-10-25 DIAGNOSIS — H26493 Other secondary cataract, bilateral: Secondary | ICD-10-CM | POA: Diagnosis not present

## 2014-10-25 DIAGNOSIS — Z961 Presence of intraocular lens: Secondary | ICD-10-CM | POA: Diagnosis not present

## 2014-10-25 DIAGNOSIS — H43812 Vitreous degeneration, left eye: Secondary | ICD-10-CM | POA: Diagnosis not present

## 2014-10-26 ENCOUNTER — Encounter (HOSPITAL_COMMUNITY): Payer: Self-pay

## 2014-10-26 ENCOUNTER — Encounter (HOSPITAL_COMMUNITY)
Admission: RE | Admit: 2014-10-26 | Discharge: 2014-10-26 | Disposition: A | Discharge status: Home / Self Care | Payer: Medicare Other | Source: Ambulatory Visit | Attending: Obstetrics & Gynecology | Admitting: Obstetrics & Gynecology

## 2014-10-26 DIAGNOSIS — N8110 Cystocele, unspecified: Secondary | ICD-10-CM | POA: Insufficient documentation

## 2014-10-26 DIAGNOSIS — N814 Uterovaginal prolapse, unspecified: Secondary | ICD-10-CM | POA: Insufficient documentation

## 2014-10-26 DIAGNOSIS — Z01818 Encounter for other preprocedural examination: Secondary | ICD-10-CM | POA: Insufficient documentation

## 2014-10-26 HISTORY — DX: Gastro-esophageal reflux disease without esophagitis: K21.9

## 2014-10-26 LAB — COMPREHENSIVE METABOLIC PANEL
ALBUMIN: 4 g/dL (ref 3.5–5.0)
ALT: 18 U/L (ref 14–54)
ANION GAP: 7 (ref 5–15)
AST: 18 U/L (ref 15–41)
Alkaline Phosphatase: 62 U/L (ref 38–126)
BUN: 21 mg/dL — AB (ref 6–20)
CALCIUM: 9.1 mg/dL (ref 8.9–10.3)
CO2: 30 mmol/L (ref 22–32)
Chloride: 96 mmol/L — ABNORMAL LOW (ref 101–111)
Creatinine, Ser: 0.64 mg/dL (ref 0.44–1.00)
GFR calc Af Amer: >60 mL/min (ref >60)
GFR calc non Af Amer: >60 mL/min (ref >60)
Glucose, Bld: 82 mg/dL (ref 65–99)
Potassium: 4.1 mmol/L (ref 3.5–5.1)
Sodium: 133 mmol/L — ABNORMAL LOW (ref 135–145)
TOTAL PROTEIN: 6.9 g/dL (ref 6.5–8.1)
Total Bilirubin: 0.5 mg/dL (ref 0.3–1.2)

## 2014-10-26 LAB — CBC
HEMATOCRIT: 39 % (ref 36.0–46.0)
HEMOGLOBIN: 13.9 g/dL (ref 12.0–15.0)
MCH: 31.7 pg (ref 26.0–34.0)
MCHC: 35.6 g/dL (ref 30.0–36.0)
MCV: 89 fL (ref 78.0–100.0)
Platelets: 345 10*3/uL (ref 150–400)
RBC: 4.38 MIL/uL (ref 3.87–5.11)
RDW: 14.1 % (ref 11.5–15.5)
WBC: 11.1 10*3/uL — ABNORMAL HIGH (ref 4.0–10.5)

## 2014-10-26 LAB — TYPE AND SCREEN
ABO/RH(D): B POS
Antibody Screen: NEGATIVE

## 2014-10-26 LAB — ABO/RH: ABO/RH(D): B POS

## 2014-10-26 NOTE — Patient Instructions (Signed)
Your procedure is scheduled on:  November 02, 2014  Enter through the Main Entrance of [facility] at: 11:30 am   Pick up the phone at the desk and dial [phone].  Call this number if you have problems the morning of surgery: [phone].  Remember: Do NOT eat food: after midnight on November 01, 2014 Do NOT drink clear liquids after:  0900 am  Take these medicines the morning of surgery with a SIP OF WATER:  Cardizem CD, prilosec, synthroid , hydrochlorothiazide   Do NOT wear jewelry (body piercing), metal hair clips/bobby pins, make-up, or nail polish. Do NOT wear lotions, powders, or perfumes.  You may wear deoderant. Do NOT shave for 48 hours prior to surgery. Do NOT bring valuables to the hospital. Contacts, dentures, or bridgework may not be worn into surgery. Leave suitcase in car.  After surgery it may be brought to your room.  For patients admitted to the hospital, checkout time is 11:00 AM the day of discharge.

## 2014-10-31 DIAGNOSIS — Z471 Aftercare following joint replacement surgery: Secondary | ICD-10-CM | POA: Diagnosis not present

## 2014-10-31 DIAGNOSIS — M7061 Trochanteric bursitis, right hip: Secondary | ICD-10-CM | POA: Diagnosis not present

## 2014-10-31 DIAGNOSIS — Z96651 Presence of right artificial knee joint: Secondary | ICD-10-CM | POA: Diagnosis not present

## 2014-11-02 ENCOUNTER — Ambulatory Visit (HOSPITAL_COMMUNITY)
Admission: RE | Admit: 2014-11-02 | Discharge: 2014-11-03 | Disposition: A | Discharge status: Home / Self Care | Payer: Medicare Other | Source: Ambulatory Visit | Attending: Obstetrics & Gynecology | Admitting: Obstetrics & Gynecology

## 2014-11-02 ENCOUNTER — Encounter (HOSPITAL_COMMUNITY): Payer: Self-pay | Admitting: *Deleted

## 2014-11-02 ENCOUNTER — Ambulatory Visit (HOSPITAL_COMMUNITY): Payer: Medicare Other | Admitting: Anesthesiology

## 2014-11-02 ENCOUNTER — Encounter (HOSPITAL_COMMUNITY)
Admission: RE | Disposition: A | Discharge status: Home / Self Care | Payer: Self-pay | Source: Ambulatory Visit | Attending: Obstetrics & Gynecology

## 2014-11-02 DIAGNOSIS — N812 Incomplete uterovaginal prolapse: Secondary | ICD-10-CM | POA: Diagnosis not present

## 2014-11-02 DIAGNOSIS — N814 Uterovaginal prolapse, unspecified: Secondary | ICD-10-CM | POA: Insufficient documentation

## 2014-11-02 DIAGNOSIS — K219 Gastro-esophageal reflux disease without esophagitis: Secondary | ICD-10-CM | POA: Insufficient documentation

## 2014-11-02 DIAGNOSIS — Z85828 Personal history of other malignant neoplasm of skin: Secondary | ICD-10-CM | POA: Insufficient documentation

## 2014-11-02 DIAGNOSIS — I10 Essential (primary) hypertension: Secondary | ICD-10-CM | POA: Insufficient documentation

## 2014-11-02 DIAGNOSIS — G4730 Sleep apnea, unspecified: Secondary | ICD-10-CM | POA: Insufficient documentation

## 2014-11-02 DIAGNOSIS — M810 Age-related osteoporosis without current pathological fracture: Secondary | ICD-10-CM | POA: Diagnosis not present

## 2014-11-02 DIAGNOSIS — M1990 Unspecified osteoarthritis, unspecified site: Secondary | ICD-10-CM | POA: Insufficient documentation

## 2014-11-02 DIAGNOSIS — E039 Hypothyroidism, unspecified: Secondary | ICD-10-CM | POA: Diagnosis not present

## 2014-11-02 DIAGNOSIS — D259 Leiomyoma of uterus, unspecified: Secondary | ICD-10-CM | POA: Insufficient documentation

## 2014-11-02 DIAGNOSIS — M353 Polymyalgia rheumatica: Secondary | ICD-10-CM | POA: Insufficient documentation

## 2014-11-02 DIAGNOSIS — N8111 Cystocele, midline: Secondary | ICD-10-CM | POA: Diagnosis not present

## 2014-11-02 DIAGNOSIS — Z885 Allergy status to narcotic agent status: Secondary | ICD-10-CM | POA: Insufficient documentation

## 2014-11-02 DIAGNOSIS — Z9889 Other specified postprocedural states: Secondary | ICD-10-CM

## 2014-11-02 DIAGNOSIS — N800 Endometriosis of uterus: Secondary | ICD-10-CM | POA: Diagnosis not present

## 2014-11-02 DIAGNOSIS — N888 Other specified noninflammatory disorders of cervix uteri: Secondary | ICD-10-CM | POA: Diagnosis not present

## 2014-11-02 HISTORY — PX: CYSTOSCOPY: SHX5120

## 2014-11-02 HISTORY — PX: ANTERIOR AND POSTERIOR REPAIR: SHX5121

## 2014-11-02 HISTORY — PX: ROBOTIC ASSISTED LAPAROSCOPIC SACROCOLPOPEXY: SHX5388

## 2014-11-02 HISTORY — PX: ROBOTIC ASSISTED TOTAL HYSTERECTOMY WITH BILATERAL SALPINGO OOPHERECTOMY: SHX6086

## 2014-11-02 LAB — TYPE AND SCREEN
ABO/RH(D): B POS
Antibody Screen: NEGATIVE

## 2014-11-02 SURGERY — ROBOTIC ASSISTED TOTAL HYSTERECTOMY WITH BILATERAL SALPINGO OOPHORECTOMY
Anesthesia: General | Site: Vagina

## 2014-11-02 MED ORDER — IBUPROFEN 600 MG PO TABS
600.0000 mg | ORAL_TABLET | Freq: Four times a day (QID) | ORAL | Status: DC | PRN
  Administered 2014-11-02 – 2014-11-03 (×3): 600 mg via ORAL
  Filled 2014-11-02 (×3): qty 1

## 2014-11-02 MED ORDER — PHENYLEPHRINE 40 MCG/ML (10ML) SYRINGE FOR IV PUSH (FOR BLOOD PRESSURE SUPPORT)
PREFILLED_SYRINGE | INTRAVENOUS | Status: AC
  Filled 2014-11-02: qty 10

## 2014-11-02 MED ORDER — FENTANYL CITRATE (PF) 100 MCG/2ML IJ SOLN
25.0000 ug | INTRAMUSCULAR | Status: DC | PRN
  Administered 2014-11-02 (×2): 25 ug via INTRAVENOUS

## 2014-11-02 MED ORDER — SODIUM CHLORIDE 0.9 % IJ SOLN
INTRAMUSCULAR | Status: AC
  Filled 2014-11-02: qty 50

## 2014-11-02 MED ORDER — LEVOTHYROXINE SODIUM 125 MCG PO TABS
125.0000 ug | ORAL_TABLET | Freq: Every day | ORAL | Status: DC
  Administered 2014-11-03: 125 ug via ORAL
  Filled 2014-11-02 (×2): qty 1

## 2014-11-02 MED ORDER — ROCURONIUM BROMIDE 100 MG/10ML IV SOLN
INTRAVENOUS | Status: DC | PRN
  Administered 2014-11-02: 50 mg via INTRAVENOUS

## 2014-11-02 MED ORDER — BUPIVACAINE HCL (PF) 0.25 % IJ SOLN
INTRAMUSCULAR | Status: DC | PRN
Start: 2014-11-02 — End: 2014-11-02
  Administered 2014-11-02: 5 mL
  Administered 2014-11-02: 10 mL

## 2014-11-02 MED ORDER — LACTATED RINGERS IV SOLN
INTRAVENOUS | Status: DC
  Administered 2014-11-02: 23:00:00 via INTRAVENOUS

## 2014-11-02 MED ORDER — ONDANSETRON HCL 4 MG/2ML IJ SOLN: 4.0000 mg | Freq: Once | INTRAMUSCULAR | Status: DC | PRN

## 2014-11-02 MED ORDER — ONDANSETRON HCL 4 MG/2ML IJ SOLN
INTRAMUSCULAR | Status: DC | PRN
  Administered 2014-11-02: 4 mg via INTRAVENOUS

## 2014-11-02 MED ORDER — PANTOPRAZOLE SODIUM 40 MG PO TBEC
40.0000 mg | DELAYED_RELEASE_TABLET | Freq: Every day | ORAL | Status: DC
  Administered 2014-11-03: 40 mg via ORAL
  Filled 2014-11-02: qty 1

## 2014-11-02 MED ORDER — HYDROCHLOROTHIAZIDE 25 MG PO TABS
25.0000 mg | ORAL_TABLET | Freq: Every morning | ORAL | Status: DC
  Administered 2014-11-03: 25 mg via ORAL
  Filled 2014-11-02: qty 1

## 2014-11-02 MED ORDER — PROPOFOL 10 MG/ML IV BOLUS
INTRAVENOUS | Status: AC
  Filled 2014-11-02: qty 20

## 2014-11-02 MED ORDER — PHENYLEPHRINE HCL 10 MG/ML IJ SOLN
INTRAMUSCULAR | Status: DC | PRN
  Administered 2014-11-02 (×7): 40 ug via INTRAVENOUS

## 2014-11-02 MED ORDER — SODIUM CHLORIDE 0.9 % IJ SOLN
INTRAMUSCULAR | Status: AC
  Filled 2014-11-02: qty 30

## 2014-11-02 MED ORDER — LACTATED RINGERS IV SOLN
INTRAVENOUS | Status: DC
  Administered 2014-11-02 (×4): via INTRAVENOUS

## 2014-11-02 MED ORDER — MEPERIDINE HCL 25 MG/ML IJ SOLN: 6.2500 mg | INTRAMUSCULAR | Status: DC | PRN

## 2014-11-02 MED ORDER — ROPIVACAINE HCL 5 MG/ML IJ SOLN
INTRAMUSCULAR | Status: AC
  Filled 2014-11-02: qty 60

## 2014-11-02 MED ORDER — CEFAZOLIN SODIUM-DEXTROSE 2-3 GM-% IV SOLR
2.0000 g | INTRAVENOUS | Status: AC
  Administered 2014-11-02: 2 g via INTRAVENOUS

## 2014-11-02 MED ORDER — DILTIAZEM HCL ER COATED BEADS 300 MG PO CP24
300.0000 mg | ORAL_CAPSULE | Freq: Every morning | ORAL | Status: DC
  Administered 2014-11-03: 300 mg via ORAL
  Filled 2014-11-02 (×2): qty 1

## 2014-11-02 MED ORDER — HYDROCODONE-ACETAMINOPHEN 5-325 MG PO TABS
1.0000 | ORAL_TABLET | ORAL | Status: DC | PRN
  Administered 2014-11-02 – 2014-11-03 (×4): 1 via ORAL
  Filled 2014-11-02 (×4): qty 1

## 2014-11-02 MED ORDER — DEXAMETHASONE SODIUM PHOSPHATE 4 MG/ML IJ SOLN
INTRAMUSCULAR | Status: DC | PRN
  Administered 2014-11-02: 4 mg via INTRAVENOUS

## 2014-11-02 MED ORDER — FENTANYL CITRATE (PF) 100 MCG/2ML IJ SOLN
INTRAMUSCULAR | Status: DC | PRN
  Administered 2014-11-02: 50 ug via INTRAVENOUS
  Administered 2014-11-02 (×2): 25 ug via INTRAVENOUS
  Administered 2014-11-02: 100 ug via INTRAVENOUS
  Administered 2014-11-02: 50 ug via INTRAVENOUS

## 2014-11-02 MED ORDER — ROCURONIUM BROMIDE 100 MG/10ML IV SOLN
INTRAVENOUS | Status: AC
  Filled 2014-11-02: qty 1

## 2014-11-02 MED ORDER — HYDROMORPHONE HCL 1 MG/ML IJ SOLN
0.5000 mg | INTRAMUSCULAR | Status: DC | PRN
  Administered 2014-11-02: 0.5 mg via INTRAVENOUS
  Filled 2014-11-02: qty 1

## 2014-11-02 MED ORDER — ARTIFICIAL TEARS OP OINT
TOPICAL_OINTMENT | OPHTHALMIC | Status: DC | PRN
  Administered 2014-11-02: 1 via OPHTHALMIC

## 2014-11-02 MED ORDER — FENTANYL CITRATE (PF) 250 MCG/5ML IJ SOLN
INTRAMUSCULAR | Status: AC
Start: 2014-11-02 — End: 2014-11-02
  Filled 2014-11-02: qty 5

## 2014-11-02 MED ORDER — ROPIVACAINE HCL 5 MG/ML IJ SOLN
INTRAMUSCULAR | Status: DC | PRN
  Administered 2014-11-02: 120 mL

## 2014-11-02 MED ORDER — LIDOCAINE HCL (CARDIAC) 20 MG/ML IV SOLN
INTRAVENOUS | Status: DC | PRN
  Administered 2014-11-02: 50 mg via INTRAVENOUS

## 2014-11-02 MED ORDER — FLUORESCEIN SODIUM 10 % IJ SOLN
INTRAMUSCULAR | Status: AC
  Filled 2014-11-02: qty 5

## 2014-11-02 MED ORDER — PROPOFOL 10 MG/ML IV BOLUS
INTRAVENOUS | Status: DC | PRN
  Administered 2014-11-02: 150 mg via INTRAVENOUS
  Administered 2014-11-02: 20 mg via INTRAVENOUS

## 2014-11-02 MED ORDER — NEOSTIGMINE METHYLSULFATE 10 MG/10ML IV SOLN
INTRAVENOUS | Status: AC
  Filled 2014-11-02: qty 1

## 2014-11-02 MED ORDER — NEOSTIGMINE METHYLSULFATE 10 MG/10ML IV SOLN
INTRAVENOUS | Status: DC | PRN
  Administered 2014-11-02: 3 mg via INTRAVENOUS

## 2014-11-02 MED ORDER — LIDOCAINE HCL (CARDIAC) 20 MG/ML IV SOLN
INTRAVENOUS | Status: AC
  Filled 2014-11-02: qty 5

## 2014-11-02 MED ORDER — BUPIVACAINE HCL (PF) 0.25 % IJ SOLN
INTRAMUSCULAR | Status: AC
  Filled 2014-11-02: qty 60

## 2014-11-02 MED ORDER — KETOROLAC TROMETHAMINE 30 MG/ML IJ SOLN
15.0000 mg | Freq: Once | INTRAMUSCULAR | Status: AC
  Administered 2014-11-02: 15 mg via INTRAVENOUS

## 2014-11-02 MED ORDER — MIDAZOLAM HCL 2 MG/2ML IJ SOLN
INTRAMUSCULAR | Status: AC
  Filled 2014-11-02: qty 2

## 2014-11-02 MED ORDER — CEFAZOLIN SODIUM-DEXTROSE 2-3 GM-% IV SOLR
INTRAVENOUS | Status: AC
  Filled 2014-11-02: qty 50

## 2014-11-02 MED ORDER — MIDAZOLAM HCL 5 MG/5ML IJ SOLN
INTRAMUSCULAR | Status: DC | PRN
  Administered 2014-11-02: 1 mg via INTRAVENOUS

## 2014-11-02 MED ORDER — GLYCOPYRROLATE 0.2 MG/ML IJ SOLN
INTRAMUSCULAR | Status: DC | PRN
  Administered 2014-11-02: 0.4 mg via INTRAVENOUS

## 2014-11-02 MED ORDER — ONDANSETRON HCL 4 MG/2ML IJ SOLN
INTRAMUSCULAR | Status: AC
  Filled 2014-11-02: qty 2

## 2014-11-02 MED ORDER — GLYCOPYRROLATE 0.2 MG/ML IJ SOLN
INTRAMUSCULAR | Status: AC
  Filled 2014-11-02: qty 2

## 2014-11-02 MED ORDER — FENTANYL CITRATE (PF) 100 MCG/2ML IJ SOLN
INTRAMUSCULAR | Status: AC
  Administered 2014-11-02: 25 ug via INTRAVENOUS
  Filled 2014-11-02: qty 2

## 2014-11-02 MED ORDER — DEXAMETHASONE SODIUM PHOSPHATE 10 MG/ML IJ SOLN
INTRAMUSCULAR | Status: AC
  Filled 2014-11-02: qty 1

## 2014-11-02 MED ORDER — KETOROLAC TROMETHAMINE 30 MG/ML IJ SOLN
INTRAMUSCULAR | Status: AC
  Filled 2014-11-02: qty 1

## 2014-11-02 SURGICAL SUPPLY — 85 items
BARRIER ADHS 3X4 INTERCEED (GAUZE/BANDAGES/DRESSINGS) ×5 IMPLANT
BLADE SURG 15 STRL LF C SS BP (BLADE) ×3 IMPLANT
BLADE SURG 15 STRL SS (BLADE) ×5
BRR ADH 4X3 ABS CNTRL BYND (GAUZE/BANDAGES/DRESSINGS) ×3
CABLE HIGH FREQUENCY MONO STRZ (ELECTRODE) ×5 IMPLANT
CATH FOLEY 3WAY  5CC 16FR (CATHETERS) ×2
CATH FOLEY 3WAY 5CC 16FR (CATHETERS) ×3 IMPLANT
CHLORAPREP W/TINT 26ML (MISCELLANEOUS) ×5 IMPLANT
CLOTH BEACON ORANGE TIMEOUT ST (SAFETY) ×5 IMPLANT
CONT PATH 16OZ SNAP LID 3702 (MISCELLANEOUS) ×5 IMPLANT
COVER BACK TABLE 60X90IN (DRAPES) ×10 IMPLANT
COVER TIP SHEARS 8 DVNC (MISCELLANEOUS) ×3 IMPLANT
COVER TIP SHEARS 8MM DA VINCI (MISCELLANEOUS) ×2
DECANTER SPIKE VIAL GLASS SM (MISCELLANEOUS) ×5 IMPLANT
DRAPE WARM FLUID 44X44 (DRAPE) ×5 IMPLANT
DRESSING OPSITE X SMALL 2X3 (GAUZE/BANDAGES/DRESSINGS) ×2 IMPLANT
DRSG COVADERM PLUS 2X2 (GAUZE/BANDAGES/DRESSINGS) ×20 IMPLANT
DRSG OPSITE POSTOP 3X4 (GAUZE/BANDAGES/DRESSINGS) ×5 IMPLANT
DURAPREP 26ML APPLICATOR (WOUND CARE) ×5 IMPLANT
ELECT REM PT RETURN 9FT ADLT (ELECTROSURGICAL) ×5
ELECTRODE REM PT RTRN 9FT ADLT (ELECTROSURGICAL) ×3 IMPLANT
GAUZE VASELINE 3X9 (GAUZE/BANDAGES/DRESSINGS) IMPLANT
GLOVE BIO SURGEON STRL SZ 6.5 (GLOVE) ×12 IMPLANT
GLOVE BIO SURGEONS STRL SZ 6.5 (GLOVE) ×3
GLOVE BIOGEL PI IND STRL 7.0 (GLOVE) ×15 IMPLANT
GLOVE BIOGEL PI INDICATOR 7.0 (GLOVE) ×10
GOWN STRL REUS W/TWL LRG LVL3 (GOWN DISPOSABLE) ×20 IMPLANT
IV STOPCOCK 4 WAY 40  W/Y SET (IV SOLUTION) ×2
IV STOPCOCK 4 WAY 40 W/Y SET (IV SOLUTION) ×3 IMPLANT
KIT ACCESSORY DA VINCI DISP (KITS) ×2
KIT ACCESSORY DVNC DISP (KITS) ×3 IMPLANT
LEGGING LITHOTOMY PAIR STRL (DRAPES) ×5 IMPLANT
LIQUID BAND (GAUZE/BANDAGES/DRESSINGS) ×8 IMPLANT
NDL SPNL 22GX3.5 QUINCKE BK (NEEDLE) IMPLANT
NEEDLE HYPO 22GX1.5 SAFETY (NEEDLE) ×5 IMPLANT
NEEDLE SPNL 22GX3.5 QUINCKE BK (NEEDLE) IMPLANT
OCCLUDER COLPOPNEUMO (BALLOONS) ×2 IMPLANT
PACK ROBOT WH (CUSTOM PROCEDURE TRAY) ×5 IMPLANT
PACK ROBOTIC GOWN (GOWN DISPOSABLE) ×5 IMPLANT
PACK VAGINAL WOMENS (CUSTOM PROCEDURE TRAY) ×5 IMPLANT
PAD POSITIONER PINK NONSTERILE (MISCELLANEOUS) ×5 IMPLANT
PAD PREP 24X48 CUFFED NSTRL (MISCELLANEOUS) ×10 IMPLANT
SET CYSTO W/LG BORE CLAMP LF (SET/KITS/TRAYS/PACK) ×2 IMPLANT
SET IRRIG TUBING LAPAROSCOPIC (IRRIGATION / IRRIGATOR) ×10 IMPLANT
SET TRI-LUMEN FLTR TB AIRSEAL (TUBING) ×2 IMPLANT
SUT GORETEX NAB #0 THX26 36IN (SUTURE) IMPLANT
SUT PROLENE 2 0 CT2 30 (SUTURE) ×6 IMPLANT
SUT VIC AB 0 CT1 27 (SUTURE)
SUT VIC AB 0 CT1 27XBRD ANBCTR (SUTURE) IMPLANT
SUT VIC AB 0 CT1 27XBRD ANTBC (SUTURE) IMPLANT
SUT VIC AB 0 CT2 27 (SUTURE) IMPLANT
SUT VIC AB 2-0 CT1 27 (SUTURE)
SUT VIC AB 2-0 CT1 TAPERPNT 27 (SUTURE) IMPLANT
SUT VIC AB 2-0 CT2 27 (SUTURE) IMPLANT
SUT VIC AB 2-0 SH 27 (SUTURE) ×10
SUT VIC AB 2-0 SH 27XBRD (SUTURE) ×6 IMPLANT
SUT VIC AB 2-0 UR5 27 (SUTURE) IMPLANT
SUT VIC AB 3-0 SH 27 (SUTURE)
SUT VIC AB 3-0 SH 27X BRD (SUTURE) IMPLANT
SUT VIC AB 4-0 PS2 27 (SUTURE) ×10 IMPLANT
SUT VIC AB 4-0 SH 27 (SUTURE) ×5
SUT VIC AB 4-0 SH 27XANBCTRL (SUTURE) ×3 IMPLANT
SUT VICRYL 0 27 CT2 27 ABS (SUTURE) IMPLANT
SUT VICRYL 0 UR6 27IN ABS (SUTURE) ×10 IMPLANT
SUT VICRYL 2 0 18  UND BR (SUTURE)
SUT VICRYL 2 0 18 UND BR (SUTURE) IMPLANT
SUT VLOC 180 0 9IN  GS21 (SUTURE) ×2
SUT VLOC 180 0 9IN GS21 (SUTURE) IMPLANT
SYR 50ML LL SCALE MARK (SYRINGE) ×5 IMPLANT
SYSTEM CONVERTIBLE TROCAR (TROCAR) ×5 IMPLANT
TIP RUMI ORANGE 6.7MMX12CM (TIP) IMPLANT
TIP UTERINE 5.1X6CM LAV DISP (MISCELLANEOUS) ×2 IMPLANT
TIP UTERINE 6.7X10CM GRN DISP (MISCELLANEOUS) IMPLANT
TIP UTERINE 6.7X6CM WHT DISP (MISCELLANEOUS) IMPLANT
TIP UTERINE 6.7X8CM BLUE DISP (MISCELLANEOUS) IMPLANT
TOWEL OR 17X24 6PK STRL BLUE (TOWEL DISPOSABLE) ×15 IMPLANT
TRAY FOLEY CATH SILVER 14FR (SET/KITS/TRAYS/PACK) ×5 IMPLANT
TROCAR 12M 150ML BLUNT (TROCAR) ×5 IMPLANT
TROCAR DISP BLADELESS 8 DVNC (TROCAR) ×3 IMPLANT
TROCAR DISP BLADELESS 8MM (TROCAR) ×2
TROCAR PORT AIRSEAL 5X120 (TROCAR) ×2 IMPLANT
TROCAR XCEL 12X100 BLDLESS (ENDOMECHANICALS) ×5 IMPLANT
TROCAR XCEL NON-BLD 5MMX100MML (ENDOMECHANICALS) ×5 IMPLANT
WARMER LAPAROSCOPE (MISCELLANEOUS) ×5 IMPLANT
WATER STERILE IRR 1000ML POUR (IV SOLUTION) ×15 IMPLANT

## 2014-11-02 NOTE — H&P (Signed)
RAELEE Brandt is an 81 y.o. female G3P3  RP:  Uterine Prolapse and Cystocele for Robotic TLH, BSO, [place] ligament suspension, Cystoscopy  Pertinent Gynecological History: Menses: post-menopausal Contraception: BT/S Blood transfusions: none Sexually transmitted diseases: no past history Previous GYN Procedures: BT/S  Last mammogram: normal  Last pap: normal  OB History: G3P3 SVD x 3  Menstrual History:  No LMP recorded. Patient is postmenopausal.    Past Medical History  Diagnosis Date  . Hypertension   . Thyroid disease   . Polymyalgia rheumatica   . Arthritis     oa  . Osteoporosis   . DJD (degenerative joint disease)   . Hypothyroidism since 2001  . Sleep apnea     uses mouthpiece somnodent, pt wishes to leave mouthpiece at home, pt told moderate severe sleep apnea  . GERD (gastroesophageal reflux disease)     takes prilosec   . Cancer     basal cell skin cancer, multiple areas removed, thryoid  . Vaginal delivery 1958, [id number]    Past Surgical History  Procedure Laterality Date  . Laminectomy    . Thyroidectomy  2001    and radiation done also, partial  . Tonsillectomy    . Dilation and curettage of uterus    . Eye surgery  2007    both eyes lens replacements for cataracts  . Tubal ligation    . Total knee arthroplasty Right 04/25/2014    Procedure: RIGHT TOTAL KNEE ARTHROPLASTY;  Surgeon: Gearlean Alf, MD;  Location: [facility];  Service: Orthopedics;  Laterality: Right;    History reviewed. No pertinent family history.  Social History:  reports that she has never smoked. She has never used smokeless tobacco. She reports that she drinks about 0.6 oz of alcohol per week. She reports that she does not use illicit drugs.  Allergies:  Allergies  Allergen Reactions  . Oxycodone Other (See Comments)    Makes me crazy    Prescriptions prior to admission  Medication Sig Dispense Refill Last Dose  . CALCIUM-VITAMIN D PO Take 1 tablet by mouth daily.   Past  Week at Unknown time  . diltiazem (CARDIZEM CD) 300 MG 24 hr capsule Take 300 mg by mouth every morning.    11/02/2014 at Unknown time  . glucosamine-chondroitin 500-400 MG tablet Take 1 tablet by mouth daily.   Past Week at Unknown time  . hydrochlorothiazide (HYDRODIURIL) 25 MG tablet Take 25 mg by mouth every morning.    11/02/2014 at Unknown time  . levothyroxine (SYNTHROID, LEVOTHROID) 125 MCG tablet Take 125 mcg by mouth every morning. On an empty stomach.   11/02/2014 at Unknown time  . Multiple Vitamin (MULTIVITAMIN WITH MINERALS) TABS tablet Take 1 tablet by mouth daily.   Past Week at Unknown time  . Omega-3 Fatty Acids (FISH OIL PO) Take 1 capsule by mouth daily.   Past Week at Unknown time  . omeprazole (PRILOSEC) 20 MG capsule Take 20 mg by mouth every morning.    11/02/2014 at Unknown time    ROS  Blood pressure 133/93, pulse 80, temperature 98.2 F (36.8 C), resp. rate 20, SpO2 98 %. Physical Exam  Uterine Prolapse grade 2/3 and Cystocele grade 4/4.  Assessment/Plan: Uterine Prolapse and Cystocele for Robotic TLH, BSO, UteroSacral ligament suspension, Possible Cystocele repair, Cystoscopy.  Surgery and risks reviewed.  Lorraine Brandt,Lorraine Brandt 11/02/2014, 1:02 PM

## 2014-11-02 NOTE — Anesthesia Procedure Notes (Signed)
Procedure Name: Intubation Date/Time: 11/02/2014 1:25 PM Performed by: Gilmer Mor R Pre-anesthesia Checklist: Patient identified, Timeout performed, Emergency Drugs available, Suction available and Patient being monitored Patient Re-evaluated:Patient Re-evaluated prior to inductionOxygen Delivery Method: Circle system utilized Preoxygenation: Pre-oxygenation with 100% oxygen Intubation Type: IV induction Ventilation: Mask ventilation without difficulty Laryngoscope Size: Mac and 3 Grade View: Grade II Tube type: Oral Tube size: 7.0 mm Number of attempts: 1 Airway Equipment and Method: Stylet Placement Confirmation: ETT inserted through vocal cords under direct vision,  positive ETCO2 and breath sounds checked- equal and bilateral Secured at: 21 (right lip) cm Tube secured with: Tape Dental Injury: Teeth and Oropharynx as per pre-operative assessment

## 2014-11-02 NOTE — Op Note (Signed)
11/02/2014  4:24 PM  PATIENT:  Lorraine Brandt  81 y.o. female  PRE-OPERATIVE DIAGNOSIS:  Symptomatic Uterine Prolapse, Cystocele  POST-OPERATIVE DIAGNOSIS:  Symptomatic Uterine Prolapse, Cystocele  PROCEDURE:  Procedure(s): ROBOTIC ASSISTED TOTAL HYSTERECTOMY WITH BILATERAL SALPINGO OOPHORECTOMY ROBOTIC ASSISTED LAPAROSCOPIC Uterosacral ligament suspension for SACROCOLPOPEXY   SURGEON:  Surgeon(s): Princess Bruins, MD  ASSISTANTS: Bess Kinds MD   ANESTHESIA:   general   PROCEDURE:  Counts patient the patient is in the lithotomy position. She is prepped with scleral prep on the abdomen and with Betadine and the suprapubic vulvar and vaginal areas. She is draped as usual. The Foley is inserted in the bladder. The small co-ring with the #6 roumi are put in place.  The tenaculum and weighted speculum are removed.  Abdominally, a 1.5 cm incision is done supraumbilically with the scalpel after infiltration of Marcaine one quarter plain.  The aponeurosis is opened under direct vision with the Mayo scissors. The parietal peritoneum is opened bluntly with a finger. A pursestring stitch of Vicryl 0 is done at the aponeurosis.  The #this inserted at that level and up pneumoperitoneum is created with CO2. Camera is inserted in that port and the abdominal pelvic cavities are inspected.  The uterus presents fibroids, both tubes and both ovaries are normal.  The liver is also normal to inspection.  We use a semicircular configuration for port placement. Each site is infiltrated with Marcaine one quarter plain and a small incision is done with the scalpel.  3 robotic ports are inserted under direct vision and one 5 mm assistant port at the left upper area.  The patient is positioned in deep Trendelenburg and the robot is docked from the right side.  The instruments are inserted under direct vision with the EndoShears scissor in the first arm, the PK in the second and the Pro grasp in the third.  We  go to the console.  Both ureters are well seen in normal anatomic position far away from the uterosacral ligaments.  We started on the left side we cauterize and section the left round ligament we then cauterized in section the left infundibulopelvic ligament and follow under the ovary.  We proceeded the same way on the right side. We then opened in the visceral peritoneum anteriorly and lowered the bladder passed the co- ring.  We then cauterized and section the right uterine artery and then the left uterine artery.  The upper aspect of the vagina was opened with the tip of the Endo Shears scissors on the Co-ring anteriorly, on each side and posteriorly. The uterus with both tubes, both ovaries and cervix were passed in 1 specimen vaginally and sent to pathology.  Hemostasis was adequate at all levels. The bladder was further retracted down to allow a good space to close the vaginal vault.  The instruments were switched to the cutting needle driver in the first arm, the long tip in the second and the PK in the third.  We used a V lock 0, 9 inches to close the vaginal vault with a running suture.  We came back on our steps to half way of the vagina.  We then used the prolene 2-0 for the uterosacral ligament transfixation.  We pulled on the V lock suture at the mid vagina to see the uterosacral ligaments better.  The proline was attached to the right uterosacral ligament distally and then to its attachment at the posterior vagina.  This shortened the uterosacral ligament by about 6  cm.  We then transfixed the right uterosacral ligament again at about 2 cm from the vagina to the vaginal vault including the anterior aspect of the vagina.  We proceeded exactly the same way on the left side.  After bilateral uterosacral ligament transfixation we could see excellent peristalsis at both ureters.  Hemostasis was adequate at all levels.  The robotic instruments were therefore removed.  The robot was undocked.  We went to  laparoscopy time.  The needles parked on the left abdominal wall were all removed using an 8 mm camera.  We then irrigated and suctioned the abdominopelvic cavities.  We reconfirmed hemostasis. Pictures were taken.  Bupivicaine was infiltrated in the abdominopelvic cavities. Laparoscopic instruments were removed.  All ports were removed under direct vision.  The CO2 was evacuated.  The pursestring stitch was attached at the supraumbilical incision. All incisions were closed with a subcuticular stitch of Vicryl 4-0.  Dermabond was added to all incisions.  A vaginal exam was done confirming good suspension of the vaginal vault and reduction of the cystocele.  The patient was brought to recovery room in good and stable status.  ESTIMATED BLOOD LOSS: 50 cc   Intake/Output Summary (Last 24 hours) at 11/02/14 1624 Last data filed at 11/02/14 1608  Gross per 24 hour  Intake   2200 ml  Output    520 ml  Net   1680 ml     BLOOD ADMINISTERED:none   LOCAL MEDICATIONS USED:  BUPIVICAINE   SPECIMEN:  Source of Specimen:  Uterus with tubes and cervix, bilateral ovaries  DISPOSITION OF SPECIMEN:  PATHOLOGY  COUNTS:  YES  PLAN OF CARE: Transfer to PACU  Princess Bruins MD  11/02/2014 at 4:24 pm

## 2014-11-02 NOTE — Transfer of Care (Signed)
Immediate Anesthesia Transfer of Care Note  Patient: Lorraine Brandt  Procedure(s) Performed: Procedure(s): ROBOTIC ASSISTED TOTAL HYSTERECTOMY WITH BILATERAL SALPINGO OOPHORECTOMY (Bilateral) ROBOTIC ASSISTED LAPAROSCOPIC Uterosacral ligament suspension for SACROCOLPOPEXY (N/A) ANTERIOR (CYSTOCELE) Repair AND Possible POSTERIOR REPAIR (RECTOCELE) (N/A) CYSTOSCOPY (N/A)  Patient Location: PACU  Anesthesia Type:General  Level of Consciousness: awake, alert , oriented and patient cooperative  Airway & Oxygen Therapy: Patient Spontanous Breathing and Patient connected to nasal cannula oxygen  Post-op Assessment: Report given to RN and Post -op Vital signs reviewed and stable  Post vital signs: Reviewed and stable  Last Vitals:  Filed Vitals:   11/02/14 1140  BP: 133/93  Pulse: 80  Temp: 36.8 C  Resp: 20    Complications: No apparent anesthesia complications

## 2014-11-02 NOTE — Anesthesia Postprocedure Evaluation (Signed)
Anesthesia Post Note  Patient: Lorraine Brandt  Procedure(s) Performed: Procedure(s) (LRB): ROBOTIC ASSISTED TOTAL HYSTERECTOMY WITH BILATERAL SALPINGO OOPHORECTOMY (Bilateral) ROBOTIC ASSISTED LAPAROSCOPIC Uterosacral ligament suspension for SACROCOLPOPEXY (N/A) ANTERIOR (CYSTOCELE) Repair AND Possible POSTERIOR REPAIR (RECTOCELE) (N/A) CYSTOSCOPY (N/A)  Anesthesia type: General  Patient location: PACU  Post pain: Pain level controlled  Post assessment: Post-op Vital signs reviewed  Last Vitals:  Filed Vitals:   11/02/14 1645  BP: 127/53  Pulse: 67  Temp:   Resp: 20    Post vital signs: Reviewed  Level of consciousness: sedated  Complications: No apparent anesthesia complications

## 2014-11-02 NOTE — Anesthesia Preprocedure Evaluation (Signed)
Anesthesia Evaluation  Patient identified by MRN, date of birth, ID band Patient awake    Reviewed: Allergy & Precautions, H&P , NPO status , Patient's Chart, lab work & pertinent test results, reviewed documented beta blocker date and time   Airway Mallampati: I  TM Distance: >3 FB Neck ROM: full    Dental no notable dental hx. (+) Teeth Intact   Pulmonary  No CPAP.   Pulmonary exam normal       Cardiovascular hypertension, Pt. on medications Normal cardiovascular exam    Neuro/Psych negative neurological ROS  negative psych ROS   GI/Hepatic Neg liver ROS, GERD-  Medicated and Controlled,  Endo/Other    Renal/GU negative Renal ROS     Musculoskeletal   Abdominal Normal abdominal exam  (+)   Peds  Hematology negative hematology ROS (+)   Anesthesia Other Findings   Reproductive/Obstetrics negative OB ROS                             Anesthesia Physical Anesthesia Plan  ASA: II  Anesthesia Plan: General   Post-op Pain Management:    Induction: Intravenous  Airway Management Planned: Oral ETT  Additional Equipment:   Intra-op Plan:   Post-operative Plan: Extubation in OR  Informed Consent: I have reviewed the patients History and Physical, chart, labs and discussed the procedure including the risks, benefits and alternatives for the proposed anesthesia with the patient or authorized representative who has indicated his/her understanding and acceptance.   Dental Advisory Given  Plan Discussed with: CRNA and Surgeon  Anesthesia Plan Comments:         Anesthesia Quick Evaluation

## 2014-11-03 DIAGNOSIS — N800 Endometriosis of uterus: Secondary | ICD-10-CM | POA: Diagnosis not present

## 2014-11-03 DIAGNOSIS — D259 Leiomyoma of uterus, unspecified: Secondary | ICD-10-CM | POA: Diagnosis not present

## 2014-11-03 DIAGNOSIS — N888 Other specified noninflammatory disorders of cervix uteri: Secondary | ICD-10-CM | POA: Diagnosis not present

## 2014-11-03 DIAGNOSIS — I10 Essential (primary) hypertension: Secondary | ICD-10-CM | POA: Diagnosis not present

## 2014-11-03 DIAGNOSIS — K219 Gastro-esophageal reflux disease without esophagitis: Secondary | ICD-10-CM | POA: Diagnosis not present

## 2014-11-03 DIAGNOSIS — N814 Uterovaginal prolapse, unspecified: Secondary | ICD-10-CM | POA: Diagnosis not present

## 2014-11-03 LAB — CBC
HCT: 32.9 % — ABNORMAL LOW (ref 36.0–46.0)
Hemoglobin: 11.6 g/dL — ABNORMAL LOW (ref 12.0–15.0)
MCH: 31.3 pg (ref 26.0–34.0)
MCHC: 35.3 g/dL (ref 30.0–36.0)
MCV: 88.7 fL (ref 78.0–100.0)
PLATELETS: 268 10*3/uL (ref 150–400)
RBC: 3.71 MIL/uL — ABNORMAL LOW (ref 3.87–5.11)
RDW: 13.9 % (ref 11.5–15.5)
WBC: 11.9 10*3/uL — AB (ref 4.0–10.5)

## 2014-11-03 MED ORDER — HYDROCODONE-ACETAMINOPHEN 5-325 MG PO TABS: 1.0000 | ORAL_TABLET | Freq: Four times a day (QID) | ORAL | Status: DC | PRN

## 2014-11-03 NOTE — Discharge Instructions (Signed)
Total Laparoscopic Hysterectomy, Care After °Refer to this sheet in the next few weeks. These instructions provide you with information on caring for yourself after your procedure. Your health care provider may also give you more specific instructions. Your treatment has been planned according to current medical practices, but problems sometimes occur. Call your health care provider if you have any problems or questions after your procedure. °WHAT TO EXPECT AFTER THE PROCEDURE °· Pain and bruising at the incision sites. You will be given pain medicine to control it. °· Menopausal symptoms such as hot flashes, night sweats, and insomnia if your ovaries were removed. °· Sore throat from the breathing tube that was inserted during surgery. °HOME CARE INSTRUCTIONS °· Only take over-the-counter or prescription medicines for pain, discomfort, or fever as directed by your health care provider.   °· Do not take aspirin. It can cause bleeding.   °· Do not drive when taking pain medicine.   °· Follow your health care provider's advice regarding diet, exercise, lifting, driving, and general activities.   °· Resume your usual diet as directed and allowed.   °· Get plenty of rest and sleep.   °· Do not douche, use tampons, or have sexual intercourse for at least 6 weeks, or until your health care provider gives you permission.   °· Change your bandages (dressings) as directed by your health care provider.   °· Monitor your temperature and notify your health care provider of a fever.   °· Take showers instead of baths for 2-3 weeks.   °· Do not drink alcohol until your health care provider gives you permission.   °· If you develop constipation, you may take a mild laxative with your health care provider's permission. Bran foods may help with constipation problems. Drinking enough fluids to keep your urine clear or pale yellow may help as well.   °· Try to have someone home with you for 1-2 weeks to help around the house.    °· Keep all of your follow-up appointments as directed by your health care provider.   °SEEK MEDICAL CARE IF: °· You have swelling, redness, or increasing pain around your incision sites.   °· You have pus coming from your incision.   °· You notice a bad smell coming from your incision.   °· Your incision breaks open.   °· You feel dizzy or lightheaded.   °· You have pain or bleeding when you urinate.   °· You have persistent diarrhea.   °· You have persistent nausea and vomiting.   °· You have abnormal vaginal discharge.   °· You have a rash.   °· You have any type of abnormal reaction or develop an allergy to your medicine.   °· You have poor pain control with your prescribed medicine.   °SEEK IMMEDIATE MEDICAL CARE IF: °· You have chest pain or shortness of breath. °· You have severe abdominal pain that is not relieved with pain medicine. °· You have pain or swelling in your legs. °MAKE SURE YOU: °· Understand these instructions. °· Will watch your condition. °· Will get help right away if you are not doing well or get worse. °Document Released: 02/16/2013 Document Revised: 05/03/2013 Document Reviewed: 02/16/2013 °[employer]® Patient Information ©2015 [employer]. This information is not intended to replace advice given to you by your health care provider. Make sure you discuss any questions you have with your health care provider. ° °

## 2014-11-03 NOTE — Discharge Summary (Signed)
  Physician Discharge Summary  Patient ID: LICET DUNPHY MRN: [id number] DOB/AGE: 1934-03-27 81 y.o.  Admit date: 11/02/2014 Discharge date: 11/03/2014  Admission Diagnoses: Symptomatic uterine prolapse and Cystocele  Discharge Diagnoses: Symptomatic uterine prolapse and Cystocele        Active Problems:   Postoperative state   Discharged Condition: good  Hospital Course: good  Consults: None  Treatments: surgery: Robotic Total Laparoscopic Hysterectomy with Bilateral Salpingo-Oophorectomy and Uterosacral ligament suspension for Sacrocolpopexy  Disposition: 06-Home-Health Care Svc     Medication List    TAKE these medications        CALCIUM-VITAMIN D PO  Take 1 tablet by mouth daily.     diltiazem 300 MG 24 hr capsule  Commonly known as:  CARDIZEM CD  Take 300 mg by mouth every morning.     FISH OIL PO  Take 1 capsule by mouth daily.     glucosamine-chondroitin 500-400 MG tablet  Take 1 tablet by mouth daily.     hydrochlorothiazide 25 MG tablet  Commonly known as:  HYDRODIURIL  Take 25 mg by mouth every morning.     HYDROcodone-acetaminophen 5-325 MG per tablet  Commonly known as:  NORCO/VICODIN  Take 1 tablet by mouth every 6 (six) hours as needed for moderate pain.     levothyroxine 125 MCG tablet  Commonly known as:  SYNTHROID, LEVOTHROID  Take 125 mcg by mouth every morning. On an empty stomach.     multivitamin with minerals Tabs tablet  Take 1 tablet by mouth daily.     omeprazole 20 MG capsule  Commonly known as:  PRILOSEC  Take 20 mg by mouth every morning.           Follow-up Information    Follow up with Karmen Altamirano,MARIE-LYNE, MD In 3 weeks.   Specialty:  Obstetrics and Gynecology   Contact information:   [place] [id number] [phone]       Signed: Princess Bruins, MD 11/03/2014, 12:21 PM

## 2014-11-03 NOTE — Progress Notes (Signed)
1 Day Post-Op: ROBOTIC ASSISTED TOTAL HYSTERECTOMY WITH BILATERAL SALPINGO OOPHORECTOMY ROBOTIC ASSISTED LAPAROSCOPIC Uterosacral ligament suspension for SACROCOLPOPEXY   Subjective: Patient reports that pain is well managed.  Tolerating normal diet as tolerated  diet without difficulty. No nausea / vomiting.  Ambulating.  Voided a very small amount, will try again now.  Objective: BP 146/65 mmHg  Pulse 66  Temp(Src) 98.5 F (36.9 C) (Oral)  Resp 16  Ht 5\' 2"  (1.575 m)  Wt 117 lb (53.071 kg)  BMI 21.39 kg/m2  SpO2 95% Lungs: clear Heart: normal rate and rhythm Abdomen:soft and appropriately tender, BS present Extremities: Homans sign is negative, no sign of DVT Incision: healing well  Postop Hb 11.6             Leuko 11.9  Assessment: s/p Procedure(s): ROBOTIC ASSISTED TOTAL HYSTERECTOMY WITH BILATERAL SALPINGO OOPHORECTOMY ROBOTIC ASSISTED LAPAROSCOPIC Uterosacral ligament suspension for SACROCOLPOPEXY : progressing well.  Needs to void before d/c.  Plan: Discharge home  After voiding.  If not successful, will scan the bladder and catheterize PRN and try spontaneous miction again before d/c.      Nassim Cosma,MARIE-LYNE 11/03/2014, 8:50 AM

## 2014-11-03 NOTE — Anesthesia Postprocedure Evaluation (Signed)
  Anesthesia Post-op Note  Patient: Lorraine Brandt  Procedure(s) Performed: Procedure(s): ROBOTIC ASSISTED TOTAL HYSTERECTOMY WITH BILATERAL SALPINGO OOPHORECTOMY (Bilateral) ROBOTIC ASSISTED LAPAROSCOPIC Uterosacral ligament suspension for SACROCOLPOPEXY (N/A) ANTERIOR (CYSTOCELE) Repair AND Possible POSTERIOR REPAIR (RECTOCELE) (N/A) CYSTOSCOPY (N/A)  Patient Location: [facility]  Anesthesia Type:General  Level of Consciousness: awake, alert , oriented and patient cooperative  Airway and Oxygen Therapy: Patient Spontanous Breathing  Post-op Pain: none  Post-op Assessment: Post-op Vital signs reviewed, Patient's Cardiovascular Status Stable, Respiratory Function Stable, Patent Airway and No signs of Nausea or vomiting              Post-op Vital Signs: Reviewed and stable  Last Vitals:  Filed Vitals:   11/03/14 0601  BP: 146/65  Pulse: 66  Temp: 36.9 C  Resp: 16    Complications: No apparent anesthesia complications

## 2014-11-03 NOTE — Addendum Note (Signed)
Addendum  created 11/03/14 4847 by Raenette Rover, CRNA   Modules edited: Notes Section   Notes Section:  File: [id number]

## 2014-11-03 NOTE — Progress Notes (Signed)
Pt and her husband verbalize understanding of d/c instructions, medications, follow up appts, when to seek medical attention and belongings policy. Encouraged pt to check room thoroughly for belongings prior to leaving. Has no questions at this time, but pain medication schedule was explained thoroughly to patient. Pt d/c to main entrance accompanied by NT and pts husband who will be driving her home. Marry Guan

## 2014-11-04 ENCOUNTER — Encounter (HOSPITAL_COMMUNITY): Payer: Self-pay | Admitting: Obstetrics & Gynecology

## 2014-12-04 DIAGNOSIS — M76891 Other specified enthesopathies of right lower limb, excluding foot: Secondary | ICD-10-CM | POA: Diagnosis not present

## 2014-12-06 DIAGNOSIS — M76899 Other specified enthesopathies of unspecified lower limb, excluding foot: Secondary | ICD-10-CM | POA: Diagnosis not present

## 2014-12-08 DIAGNOSIS — M76891 Other specified enthesopathies of right lower limb, excluding foot: Secondary | ICD-10-CM | POA: Diagnosis not present

## 2014-12-11 DIAGNOSIS — M76891 Other specified enthesopathies of right lower limb, excluding foot: Secondary | ICD-10-CM | POA: Diagnosis not present

## 2014-12-13 DIAGNOSIS — M76891 Other specified enthesopathies of right lower limb, excluding foot: Secondary | ICD-10-CM | POA: Diagnosis not present

## 2014-12-15 DIAGNOSIS — M76899 Other specified enthesopathies of unspecified lower limb, excluding foot: Secondary | ICD-10-CM | POA: Diagnosis not present

## 2014-12-18 DIAGNOSIS — M76891 Other specified enthesopathies of right lower limb, excluding foot: Secondary | ICD-10-CM | POA: Diagnosis not present

## 2014-12-19 DIAGNOSIS — Z96651 Presence of right artificial knee joint: Secondary | ICD-10-CM | POA: Diagnosis not present

## 2014-12-19 DIAGNOSIS — Z471 Aftercare following joint replacement surgery: Secondary | ICD-10-CM | POA: Diagnosis not present

## 2014-12-19 DIAGNOSIS — M7050 Other bursitis of knee, unspecified knee: Secondary | ICD-10-CM | POA: Diagnosis not present

## 2015-01-30 DIAGNOSIS — Z96651 Presence of right artificial knee joint: Secondary | ICD-10-CM | POA: Diagnosis not present

## 2015-01-30 DIAGNOSIS — Z471 Aftercare following joint replacement surgery: Secondary | ICD-10-CM | POA: Diagnosis not present

## 2015-01-30 DIAGNOSIS — M7051 Other bursitis of knee, right knee: Secondary | ICD-10-CM | POA: Diagnosis not present

## 2015-03-06 DIAGNOSIS — Z1389 Encounter for screening for other disorder: Secondary | ICD-10-CM | POA: Diagnosis not present

## 2015-03-06 DIAGNOSIS — M810 Age-related osteoporosis without current pathological fracture: Secondary | ICD-10-CM | POA: Diagnosis not present

## 2015-03-06 DIAGNOSIS — E871 Hypo-osmolality and hyponatremia: Secondary | ICD-10-CM | POA: Diagnosis not present

## 2015-03-06 DIAGNOSIS — C73 Malignant neoplasm of thyroid gland: Secondary | ICD-10-CM | POA: Diagnosis not present

## 2015-03-06 DIAGNOSIS — Z6822 Body mass index (BMI) 22.0-22.9, adult: Secondary | ICD-10-CM | POA: Diagnosis not present

## 2015-03-06 DIAGNOSIS — D649 Anemia, unspecified: Secondary | ICD-10-CM | POA: Diagnosis not present

## 2015-03-06 DIAGNOSIS — M353 Polymyalgia rheumatica: Secondary | ICD-10-CM | POA: Diagnosis not present

## 2015-03-06 DIAGNOSIS — E559 Vitamin D deficiency, unspecified: Secondary | ICD-10-CM | POA: Diagnosis not present

## 2015-03-06 DIAGNOSIS — G4733 Obstructive sleep apnea (adult) (pediatric): Secondary | ICD-10-CM | POA: Diagnosis not present

## 2015-03-06 DIAGNOSIS — E890 Postprocedural hypothyroidism: Secondary | ICD-10-CM | POA: Diagnosis not present

## 2015-03-13 DIAGNOSIS — Z23 Encounter for immunization: Secondary | ICD-10-CM | POA: Diagnosis not present

## 2015-03-27 DIAGNOSIS — K210 Gastro-esophageal reflux disease with esophagitis: Secondary | ICD-10-CM | POA: Diagnosis not present

## 2015-03-27 DIAGNOSIS — Z85850 Personal history of malignant neoplasm of thyroid: Secondary | ICD-10-CM | POA: Diagnosis not present

## 2015-03-27 DIAGNOSIS — M353 Polymyalgia rheumatica: Secondary | ICD-10-CM | POA: Diagnosis not present

## 2015-03-27 DIAGNOSIS — M810 Age-related osteoporosis without current pathological fracture: Secondary | ICD-10-CM | POA: Diagnosis not present

## 2015-03-27 DIAGNOSIS — I10 Essential (primary) hypertension: Secondary | ICD-10-CM | POA: Diagnosis not present

## 2015-03-27 DIAGNOSIS — E871 Hypo-osmolality and hyponatremia: Secondary | ICD-10-CM | POA: Diagnosis not present

## 2015-03-27 DIAGNOSIS — G4733 Obstructive sleep apnea (adult) (pediatric): Secondary | ICD-10-CM | POA: Diagnosis not present

## 2015-03-27 DIAGNOSIS — E890 Postprocedural hypothyroidism: Secondary | ICD-10-CM | POA: Diagnosis not present

## 2015-03-30 ENCOUNTER — Other Ambulatory Visit: Payer: Self-pay | Admitting: Family Medicine

## 2015-03-30 DIAGNOSIS — M810 Age-related osteoporosis without current pathological fracture: Secondary | ICD-10-CM

## 2015-04-16 DIAGNOSIS — E890 Postprocedural hypothyroidism: Secondary | ICD-10-CM | POA: Diagnosis not present

## 2015-04-16 DIAGNOSIS — I10 Essential (primary) hypertension: Secondary | ICD-10-CM | POA: Diagnosis not present

## 2015-04-16 DIAGNOSIS — G4733 Obstructive sleep apnea (adult) (pediatric): Secondary | ICD-10-CM | POA: Diagnosis not present

## 2015-04-16 DIAGNOSIS — M810 Age-related osteoporosis without current pathological fracture: Secondary | ICD-10-CM | POA: Diagnosis not present

## 2015-04-16 DIAGNOSIS — E871 Hypo-osmolality and hyponatremia: Secondary | ICD-10-CM | POA: Diagnosis not present

## 2015-04-16 DIAGNOSIS — Z85850 Personal history of malignant neoplasm of thyroid: Secondary | ICD-10-CM | POA: Diagnosis not present

## 2015-04-16 DIAGNOSIS — M353 Polymyalgia rheumatica: Secondary | ICD-10-CM | POA: Diagnosis not present

## 2015-04-16 DIAGNOSIS — K210 Gastro-esophageal reflux disease with esophagitis: Secondary | ICD-10-CM | POA: Diagnosis not present

## 2015-04-24 DIAGNOSIS — J069 Acute upper respiratory infection, unspecified: Secondary | ICD-10-CM | POA: Diagnosis not present

## 2015-04-24 DIAGNOSIS — Z471 Aftercare following joint replacement surgery: Secondary | ICD-10-CM | POA: Diagnosis not present

## 2015-04-24 DIAGNOSIS — Z96651 Presence of right artificial knee joint: Secondary | ICD-10-CM | POA: Diagnosis not present

## 2015-05-02 DIAGNOSIS — I10 Essential (primary) hypertension: Secondary | ICD-10-CM | POA: Diagnosis not present

## 2015-05-02 DIAGNOSIS — E559 Vitamin D deficiency, unspecified: Secondary | ICD-10-CM | POA: Diagnosis not present

## 2015-05-08 ENCOUNTER — Other Ambulatory Visit: Payer: Self-pay

## 2015-05-08 DIAGNOSIS — Z1231 Encounter for screening mammogram for malignant neoplasm of breast: Secondary | ICD-10-CM

## 2015-05-10 DIAGNOSIS — D2262 Melanocytic nevi of left upper limb, including shoulder: Secondary | ICD-10-CM | POA: Diagnosis not present

## 2015-05-10 DIAGNOSIS — D225 Melanocytic nevi of trunk: Secondary | ICD-10-CM | POA: Diagnosis not present

## 2015-05-10 DIAGNOSIS — Z85828 Personal history of other malignant neoplasm of skin: Secondary | ICD-10-CM | POA: Diagnosis not present

## 2015-05-10 DIAGNOSIS — D2271 Melanocytic nevi of right lower limb, including hip: Secondary | ICD-10-CM | POA: Diagnosis not present

## 2015-05-10 DIAGNOSIS — L819 Disorder of pigmentation, unspecified: Secondary | ICD-10-CM | POA: Diagnosis not present

## 2015-05-10 DIAGNOSIS — D2239 Melanocytic nevi of other parts of face: Secondary | ICD-10-CM | POA: Diagnosis not present

## 2015-05-10 DIAGNOSIS — L821 Other seborrheic keratosis: Secondary | ICD-10-CM | POA: Diagnosis not present

## 2015-05-10 DIAGNOSIS — D3617 Benign neoplasm of peripheral nerves and autonomic nervous system of trunk, unspecified: Secondary | ICD-10-CM | POA: Diagnosis not present

## 2015-05-10 DIAGNOSIS — L814 Other melanin hyperpigmentation: Secondary | ICD-10-CM | POA: Diagnosis not present

## 2015-05-10 DIAGNOSIS — L720 Epidermal cyst: Secondary | ICD-10-CM | POA: Diagnosis not present

## 2015-05-10 DIAGNOSIS — D485 Neoplasm of uncertain behavior of skin: Secondary | ICD-10-CM | POA: Diagnosis not present

## 2015-05-10 DIAGNOSIS — L570 Actinic keratosis: Secondary | ICD-10-CM | POA: Diagnosis not present

## 2015-05-10 DIAGNOSIS — B078 Other viral warts: Secondary | ICD-10-CM | POA: Diagnosis not present

## 2015-05-11 ENCOUNTER — Ambulatory Visit
Admission: RE | Admit: 2015-05-11 | Discharge: 2015-05-11 | Disposition: A | Discharge status: Home / Self Care | Payer: Medicare Other | Source: Ambulatory Visit | Attending: Family Medicine | Admitting: Family Medicine

## 2015-05-11 DIAGNOSIS — M810 Age-related osteoporosis without current pathological fracture: Secondary | ICD-10-CM

## 2015-06-11 ENCOUNTER — Ambulatory Visit
Admission: RE | Admit: 2015-06-11 | Discharge: 2015-06-11 | Disposition: A | Discharge status: Home / Self Care | Payer: Medicare Other | Source: Ambulatory Visit

## 2015-06-11 DIAGNOSIS — Z1231 Encounter for screening mammogram for malignant neoplasm of breast: Secondary | ICD-10-CM | POA: Diagnosis not present

## 2015-07-19 DIAGNOSIS — I10 Essential (primary) hypertension: Secondary | ICD-10-CM | POA: Diagnosis not present

## 2015-07-19 DIAGNOSIS — M7542 Impingement syndrome of left shoulder: Secondary | ICD-10-CM | POA: Diagnosis not present

## 2015-07-19 DIAGNOSIS — M7541 Impingement syndrome of right shoulder: Secondary | ICD-10-CM | POA: Diagnosis not present

## 2015-08-02 DIAGNOSIS — L570 Actinic keratosis: Secondary | ICD-10-CM | POA: Diagnosis not present

## 2015-08-02 DIAGNOSIS — Z85828 Personal history of other malignant neoplasm of skin: Secondary | ICD-10-CM | POA: Diagnosis not present

## 2015-08-02 DIAGNOSIS — L820 Inflamed seborrheic keratosis: Secondary | ICD-10-CM | POA: Diagnosis not present

## 2015-09-27 DIAGNOSIS — M810 Age-related osteoporosis without current pathological fracture: Secondary | ICD-10-CM | POA: Diagnosis not present

## 2015-09-27 DIAGNOSIS — I10 Essential (primary) hypertension: Secondary | ICD-10-CM | POA: Diagnosis not present

## 2015-09-27 DIAGNOSIS — R350 Frequency of micturition: Secondary | ICD-10-CM | POA: Diagnosis not present

## 2015-09-27 DIAGNOSIS — N952 Postmenopausal atrophic vaginitis: Secondary | ICD-10-CM | POA: Diagnosis not present

## 2015-09-27 DIAGNOSIS — F418 Other specified anxiety disorders: Secondary | ICD-10-CM | POA: Diagnosis not present

## 2015-09-27 DIAGNOSIS — G4733 Obstructive sleep apnea (adult) (pediatric): Secondary | ICD-10-CM | POA: Diagnosis not present

## 2015-09-27 DIAGNOSIS — Z1389 Encounter for screening for other disorder: Secondary | ICD-10-CM | POA: Diagnosis not present

## 2015-09-27 DIAGNOSIS — K210 Gastro-esophageal reflux disease with esophagitis: Secondary | ICD-10-CM | POA: Diagnosis not present

## 2015-09-27 DIAGNOSIS — E890 Postprocedural hypothyroidism: Secondary | ICD-10-CM | POA: Diagnosis not present

## 2015-10-29 DIAGNOSIS — N952 Postmenopausal atrophic vaginitis: Secondary | ICD-10-CM | POA: Diagnosis not present

## 2015-10-29 DIAGNOSIS — R350 Frequency of micturition: Secondary | ICD-10-CM | POA: Diagnosis not present

## 2015-10-29 DIAGNOSIS — R351 Nocturia: Secondary | ICD-10-CM | POA: Diagnosis not present

## 2015-12-13 DIAGNOSIS — M79674 Pain in right toe(s): Secondary | ICD-10-CM | POA: Diagnosis not present

## 2015-12-13 DIAGNOSIS — S92514A Nondisplaced fracture of proximal phalanx of right lesser toe(s), initial encounter for closed fracture: Secondary | ICD-10-CM | POA: Diagnosis not present

## 2015-12-14 DIAGNOSIS — Z961 Presence of intraocular lens: Secondary | ICD-10-CM | POA: Diagnosis not present

## 2016-01-23 DIAGNOSIS — Z85828 Personal history of other malignant neoplasm of skin: Secondary | ICD-10-CM | POA: Diagnosis not present

## 2016-01-23 DIAGNOSIS — L570 Actinic keratosis: Secondary | ICD-10-CM | POA: Diagnosis not present

## 2016-01-28 DIAGNOSIS — M216X2 Other acquired deformities of left foot: Secondary | ICD-10-CM | POA: Diagnosis not present

## 2016-02-01 DIAGNOSIS — Z96651 Presence of right artificial knee joint: Secondary | ICD-10-CM | POA: Diagnosis not present

## 2016-02-01 DIAGNOSIS — Z471 Aftercare following joint replacement surgery: Secondary | ICD-10-CM | POA: Diagnosis not present

## 2016-02-22 DIAGNOSIS — M7541 Impingement syndrome of right shoulder: Secondary | ICD-10-CM | POA: Diagnosis not present

## 2016-02-22 DIAGNOSIS — M7542 Impingement syndrome of left shoulder: Secondary | ICD-10-CM | POA: Diagnosis not present

## 2016-03-05 DIAGNOSIS — R238 Other skin changes: Secondary | ICD-10-CM | POA: Diagnosis not present

## 2016-03-05 DIAGNOSIS — G4733 Obstructive sleep apnea (adult) (pediatric): Secondary | ICD-10-CM | POA: Diagnosis not present

## 2016-03-05 DIAGNOSIS — Z6822 Body mass index (BMI) 22.0-22.9, adult: Secondary | ICD-10-CM | POA: Diagnosis not present

## 2016-03-05 DIAGNOSIS — E871 Hypo-osmolality and hyponatremia: Secondary | ICD-10-CM | POA: Diagnosis not present

## 2016-03-05 DIAGNOSIS — D6489 Other specified anemias: Secondary | ICD-10-CM | POA: Diagnosis not present

## 2016-03-05 DIAGNOSIS — E890 Postprocedural hypothyroidism: Secondary | ICD-10-CM | POA: Diagnosis not present

## 2016-03-05 DIAGNOSIS — C73 Malignant neoplasm of thyroid gland: Secondary | ICD-10-CM | POA: Diagnosis not present

## 2016-03-05 DIAGNOSIS — M353 Polymyalgia rheumatica: Secondary | ICD-10-CM | POA: Diagnosis not present

## 2016-03-05 DIAGNOSIS — E559 Vitamin D deficiency, unspecified: Secondary | ICD-10-CM | POA: Diagnosis not present

## 2016-03-05 DIAGNOSIS — Z1389 Encounter for screening for other disorder: Secondary | ICD-10-CM | POA: Diagnosis not present

## 2016-03-17 DIAGNOSIS — Z23 Encounter for immunization: Secondary | ICD-10-CM | POA: Diagnosis not present

## 2016-04-02 DIAGNOSIS — L720 Epidermal cyst: Secondary | ICD-10-CM | POA: Diagnosis not present

## 2016-04-02 DIAGNOSIS — Z85828 Personal history of other malignant neoplasm of skin: Secondary | ICD-10-CM | POA: Diagnosis not present

## 2016-04-09 DIAGNOSIS — L570 Actinic keratosis: Secondary | ICD-10-CM | POA: Diagnosis not present

## 2016-04-09 DIAGNOSIS — Z85828 Personal history of other malignant neoplasm of skin: Secondary | ICD-10-CM | POA: Diagnosis not present

## 2016-04-09 DIAGNOSIS — L720 Epidermal cyst: Secondary | ICD-10-CM | POA: Diagnosis not present

## 2016-04-11 DIAGNOSIS — M810 Age-related osteoporosis without current pathological fracture: Secondary | ICD-10-CM | POA: Diagnosis not present

## 2016-04-11 DIAGNOSIS — E890 Postprocedural hypothyroidism: Secondary | ICD-10-CM | POA: Diagnosis not present

## 2016-04-11 DIAGNOSIS — E559 Vitamin D deficiency, unspecified: Secondary | ICD-10-CM | POA: Diagnosis not present

## 2016-04-11 DIAGNOSIS — Z1211 Encounter for screening for malignant neoplasm of colon: Secondary | ICD-10-CM | POA: Diagnosis not present

## 2016-04-11 DIAGNOSIS — Z85850 Personal history of malignant neoplasm of thyroid: Secondary | ICD-10-CM | POA: Diagnosis not present

## 2016-04-11 DIAGNOSIS — K210 Gastro-esophageal reflux disease with esophagitis: Secondary | ICD-10-CM | POA: Diagnosis not present

## 2016-04-11 DIAGNOSIS — G4733 Obstructive sleep apnea (adult) (pediatric): Secondary | ICD-10-CM | POA: Diagnosis not present

## 2016-04-11 DIAGNOSIS — F411 Generalized anxiety disorder: Secondary | ICD-10-CM | POA: Diagnosis not present

## 2016-04-11 DIAGNOSIS — I10 Essential (primary) hypertension: Secondary | ICD-10-CM | POA: Diagnosis not present

## 2016-04-21 DIAGNOSIS — Z1211 Encounter for screening for malignant neoplasm of colon: Secondary | ICD-10-CM | POA: Diagnosis not present

## 2016-04-24 DIAGNOSIS — Z85828 Personal history of other malignant neoplasm of skin: Secondary | ICD-10-CM | POA: Diagnosis not present

## 2016-04-24 DIAGNOSIS — L723 Sebaceous cyst: Secondary | ICD-10-CM | POA: Diagnosis not present

## 2016-05-08 ENCOUNTER — Other Ambulatory Visit: Payer: Self-pay | Admitting: Family Medicine

## 2016-05-08 DIAGNOSIS — Z1231 Encounter for screening mammogram for malignant neoplasm of breast: Secondary | ICD-10-CM

## 2016-05-13 DIAGNOSIS — M19012 Primary osteoarthritis, left shoulder: Secondary | ICD-10-CM | POA: Diagnosis not present

## 2016-05-13 DIAGNOSIS — M7522 Bicipital tendinitis, left shoulder: Secondary | ICD-10-CM | POA: Diagnosis not present

## 2016-05-16 DIAGNOSIS — F411 Generalized anxiety disorder: Secondary | ICD-10-CM | POA: Diagnosis not present

## 2016-06-18 ENCOUNTER — Ambulatory Visit
Admission: RE | Admit: 2016-06-18 | Discharge: 2016-06-18 | Disposition: A | Discharge status: Home / Self Care | Payer: Medicare Other | Source: Ambulatory Visit | Attending: Family Medicine | Admitting: Family Medicine

## 2016-06-18 DIAGNOSIS — Z1231 Encounter for screening mammogram for malignant neoplasm of breast: Secondary | ICD-10-CM

## 2016-06-20 DIAGNOSIS — M19012 Primary osteoarthritis, left shoulder: Secondary | ICD-10-CM | POA: Diagnosis not present

## 2016-06-20 DIAGNOSIS — M7542 Impingement syndrome of left shoulder: Secondary | ICD-10-CM | POA: Diagnosis not present

## 2016-07-31 DIAGNOSIS — L814 Other melanin hyperpigmentation: Secondary | ICD-10-CM | POA: Diagnosis not present

## 2016-07-31 DIAGNOSIS — L570 Actinic keratosis: Secondary | ICD-10-CM | POA: Diagnosis not present

## 2016-07-31 DIAGNOSIS — Z85828 Personal history of other malignant neoplasm of skin: Secondary | ICD-10-CM | POA: Diagnosis not present

## 2016-07-31 DIAGNOSIS — D692 Other nonthrombocytopenic purpura: Secondary | ICD-10-CM | POA: Diagnosis not present

## 2016-07-31 DIAGNOSIS — D2271 Melanocytic nevi of right lower limb, including hip: Secondary | ICD-10-CM | POA: Diagnosis not present

## 2016-07-31 DIAGNOSIS — D2262 Melanocytic nevi of left upper limb, including shoulder: Secondary | ICD-10-CM | POA: Diagnosis not present

## 2016-07-31 DIAGNOSIS — L821 Other seborrheic keratosis: Secondary | ICD-10-CM | POA: Diagnosis not present

## 2016-07-31 DIAGNOSIS — D1801 Hemangioma of skin and subcutaneous tissue: Secondary | ICD-10-CM | POA: Diagnosis not present

## 2016-07-31 DIAGNOSIS — D2261 Melanocytic nevi of right upper limb, including shoulder: Secondary | ICD-10-CM | POA: Diagnosis not present

## 2016-07-31 DIAGNOSIS — D225 Melanocytic nevi of trunk: Secondary | ICD-10-CM | POA: Diagnosis not present

## 2016-10-01 DIAGNOSIS — M7542 Impingement syndrome of left shoulder: Secondary | ICD-10-CM | POA: Diagnosis not present

## 2016-10-01 DIAGNOSIS — M7541 Impingement syndrome of right shoulder: Secondary | ICD-10-CM | POA: Diagnosis not present

## 2016-10-01 DIAGNOSIS — M19012 Primary osteoarthritis, left shoulder: Secondary | ICD-10-CM | POA: Diagnosis not present

## 2016-10-10 ENCOUNTER — Other Ambulatory Visit: Payer: Self-pay | Admitting: Family Medicine

## 2016-10-10 DIAGNOSIS — M7542 Impingement syndrome of left shoulder: Secondary | ICD-10-CM

## 2016-10-14 DIAGNOSIS — Z0000 Encounter for general adult medical examination without abnormal findings: Secondary | ICD-10-CM | POA: Diagnosis not present

## 2016-10-14 DIAGNOSIS — D692 Other nonthrombocytopenic purpura: Secondary | ICD-10-CM | POA: Diagnosis not present

## 2016-10-14 DIAGNOSIS — M810 Age-related osteoporosis without current pathological fracture: Secondary | ICD-10-CM | POA: Diagnosis not present

## 2016-10-14 DIAGNOSIS — N952 Postmenopausal atrophic vaginitis: Secondary | ICD-10-CM | POA: Diagnosis not present

## 2016-10-14 DIAGNOSIS — K210 Gastro-esophageal reflux disease with esophagitis: Secondary | ICD-10-CM | POA: Diagnosis not present

## 2016-10-14 DIAGNOSIS — G4733 Obstructive sleep apnea (adult) (pediatric): Secondary | ICD-10-CM | POA: Diagnosis not present

## 2016-10-14 DIAGNOSIS — Z85850 Personal history of malignant neoplasm of thyroid: Secondary | ICD-10-CM | POA: Diagnosis not present

## 2016-10-14 DIAGNOSIS — F411 Generalized anxiety disorder: Secondary | ICD-10-CM | POA: Diagnosis not present

## 2016-10-14 DIAGNOSIS — E559 Vitamin D deficiency, unspecified: Secondary | ICD-10-CM | POA: Diagnosis not present

## 2016-10-14 DIAGNOSIS — Z124 Encounter for screening for malignant neoplasm of cervix: Secondary | ICD-10-CM | POA: Diagnosis not present

## 2016-10-14 DIAGNOSIS — I10 Essential (primary) hypertension: Secondary | ICD-10-CM | POA: Diagnosis not present

## 2016-10-25 DIAGNOSIS — R21 Rash and other nonspecific skin eruption: Secondary | ICD-10-CM | POA: Diagnosis not present

## 2016-10-25 DIAGNOSIS — S30861A Insect bite (nonvenomous) of abdominal wall, initial encounter: Secondary | ICD-10-CM | POA: Diagnosis not present

## 2016-10-27 ENCOUNTER — Ambulatory Visit
Admission: RE | Admit: 2016-10-27 | Discharge: 2016-10-27 | Disposition: A | Discharge status: Home / Self Care | Payer: Medicare Other | Source: Ambulatory Visit | Attending: Family Medicine | Admitting: Family Medicine

## 2016-10-27 DIAGNOSIS — M7542 Impingement syndrome of left shoulder: Secondary | ICD-10-CM

## 2016-10-27 DIAGNOSIS — M7580 Other shoulder lesions, unspecified shoulder: Secondary | ICD-10-CM | POA: Diagnosis not present

## 2016-10-30 DIAGNOSIS — W57XXXA Bitten or stung by nonvenomous insect and other nonvenomous arthropods, initial encounter: Secondary | ICD-10-CM | POA: Diagnosis not present

## 2016-10-30 DIAGNOSIS — S30861A Insect bite (nonvenomous) of abdominal wall, initial encounter: Secondary | ICD-10-CM | POA: Diagnosis not present

## 2016-11-17 DIAGNOSIS — M7582 Other shoulder lesions, left shoulder: Secondary | ICD-10-CM | POA: Diagnosis not present

## 2016-11-17 DIAGNOSIS — M19012 Primary osteoarthritis, left shoulder: Secondary | ICD-10-CM | POA: Diagnosis not present

## 2016-11-17 DIAGNOSIS — M7542 Impingement syndrome of left shoulder: Secondary | ICD-10-CM | POA: Diagnosis not present

## 2016-11-19 DIAGNOSIS — M7542 Impingement syndrome of left shoulder: Secondary | ICD-10-CM | POA: Diagnosis not present

## 2016-11-20 DIAGNOSIS — M25531 Pain in right wrist: Secondary | ICD-10-CM | POA: Diagnosis not present

## 2016-11-20 DIAGNOSIS — S52551A Other extraarticular fracture of lower end of right radius, initial encounter for closed fracture: Secondary | ICD-10-CM | POA: Diagnosis not present

## 2016-11-24 DIAGNOSIS — M7542 Impingement syndrome of left shoulder: Secondary | ICD-10-CM | POA: Diagnosis not present

## 2016-11-26 DIAGNOSIS — M7542 Impingement syndrome of left shoulder: Secondary | ICD-10-CM | POA: Diagnosis not present

## 2016-12-01 DIAGNOSIS — M7542 Impingement syndrome of left shoulder: Secondary | ICD-10-CM | POA: Diagnosis not present

## 2016-12-03 DIAGNOSIS — S52551D Other extraarticular fracture of lower end of right radius, subsequent encounter for closed fracture with routine healing: Secondary | ICD-10-CM | POA: Diagnosis not present

## 2016-12-03 DIAGNOSIS — M25531 Pain in right wrist: Secondary | ICD-10-CM | POA: Diagnosis not present

## 2016-12-04 DIAGNOSIS — M7542 Impingement syndrome of left shoulder: Secondary | ICD-10-CM | POA: Diagnosis not present

## 2016-12-08 DIAGNOSIS — M7542 Impingement syndrome of left shoulder: Secondary | ICD-10-CM | POA: Diagnosis not present

## 2016-12-10 DIAGNOSIS — M7542 Impingement syndrome of left shoulder: Secondary | ICD-10-CM | POA: Diagnosis not present

## 2016-12-15 DIAGNOSIS — M7542 Impingement syndrome of left shoulder: Secondary | ICD-10-CM | POA: Diagnosis not present

## 2016-12-17 DIAGNOSIS — Z85828 Personal history of other malignant neoplasm of skin: Secondary | ICD-10-CM | POA: Diagnosis not present

## 2016-12-17 DIAGNOSIS — L720 Epidermal cyst: Secondary | ICD-10-CM | POA: Diagnosis not present

## 2016-12-22 DIAGNOSIS — M7542 Impingement syndrome of left shoulder: Secondary | ICD-10-CM | POA: Diagnosis not present

## 2016-12-24 DIAGNOSIS — S52551D Other extraarticular fracture of lower end of right radius, subsequent encounter for closed fracture with routine healing: Secondary | ICD-10-CM | POA: Diagnosis not present

## 2016-12-25 DIAGNOSIS — M7542 Impingement syndrome of left shoulder: Secondary | ICD-10-CM | POA: Diagnosis not present

## 2016-12-26 DIAGNOSIS — H26491 Other secondary cataract, right eye: Secondary | ICD-10-CM | POA: Diagnosis not present

## 2016-12-26 DIAGNOSIS — H5203 Hypermetropia, bilateral: Secondary | ICD-10-CM | POA: Diagnosis not present

## 2016-12-29 DIAGNOSIS — M7542 Impingement syndrome of left shoulder: Secondary | ICD-10-CM | POA: Diagnosis not present

## 2016-12-29 DIAGNOSIS — M19012 Primary osteoarthritis, left shoulder: Secondary | ICD-10-CM | POA: Diagnosis not present

## 2017-01-21 DIAGNOSIS — S52551D Other extraarticular fracture of lower end of right radius, subsequent encounter for closed fracture with routine healing: Secondary | ICD-10-CM | POA: Diagnosis not present

## 2017-02-02 DIAGNOSIS — Z23 Encounter for immunization: Secondary | ICD-10-CM | POA: Diagnosis not present

## 2017-03-09 DIAGNOSIS — Z1389 Encounter for screening for other disorder: Secondary | ICD-10-CM | POA: Diagnosis not present

## 2017-03-09 DIAGNOSIS — D6489 Other specified anemias: Secondary | ICD-10-CM | POA: Diagnosis not present

## 2017-03-09 DIAGNOSIS — M810 Age-related osteoporosis without current pathological fracture: Secondary | ICD-10-CM | POA: Diagnosis not present

## 2017-03-09 DIAGNOSIS — Z6822 Body mass index (BMI) 22.0-22.9, adult: Secondary | ICD-10-CM | POA: Diagnosis not present

## 2017-03-09 DIAGNOSIS — E559 Vitamin D deficiency, unspecified: Secondary | ICD-10-CM | POA: Diagnosis not present

## 2017-03-09 DIAGNOSIS — C73 Malignant neoplasm of thyroid gland: Secondary | ICD-10-CM | POA: Diagnosis not present

## 2017-03-09 DIAGNOSIS — E871 Hypo-osmolality and hyponatremia: Secondary | ICD-10-CM | POA: Diagnosis not present

## 2017-03-09 DIAGNOSIS — E890 Postprocedural hypothyroidism: Secondary | ICD-10-CM | POA: Diagnosis not present

## 2017-03-11 DIAGNOSIS — M25531 Pain in right wrist: Secondary | ICD-10-CM | POA: Diagnosis not present

## 2017-04-13 DIAGNOSIS — M25572 Pain in left ankle and joints of left foot: Secondary | ICD-10-CM | POA: Diagnosis not present

## 2017-04-13 DIAGNOSIS — S86012A Strain of left Achilles tendon, initial encounter: Secondary | ICD-10-CM | POA: Diagnosis not present

## 2017-04-22 DIAGNOSIS — N952 Postmenopausal atrophic vaginitis: Secondary | ICD-10-CM | POA: Diagnosis not present

## 2017-04-22 DIAGNOSIS — E559 Vitamin D deficiency, unspecified: Secondary | ICD-10-CM | POA: Diagnosis not present

## 2017-04-22 DIAGNOSIS — Z23 Encounter for immunization: Secondary | ICD-10-CM | POA: Diagnosis not present

## 2017-04-22 DIAGNOSIS — K210 Gastro-esophageal reflux disease with esophagitis: Secondary | ICD-10-CM | POA: Diagnosis not present

## 2017-04-22 DIAGNOSIS — M810 Age-related osteoporosis without current pathological fracture: Secondary | ICD-10-CM | POA: Diagnosis not present

## 2017-04-22 DIAGNOSIS — E7800 Pure hypercholesterolemia, unspecified: Secondary | ICD-10-CM | POA: Diagnosis not present

## 2017-04-22 DIAGNOSIS — E890 Postprocedural hypothyroidism: Secondary | ICD-10-CM | POA: Diagnosis not present

## 2017-04-22 DIAGNOSIS — Z85850 Personal history of malignant neoplasm of thyroid: Secondary | ICD-10-CM | POA: Diagnosis not present

## 2017-04-22 DIAGNOSIS — I10 Essential (primary) hypertension: Secondary | ICD-10-CM | POA: Diagnosis not present

## 2017-04-22 DIAGNOSIS — F411 Generalized anxiety disorder: Secondary | ICD-10-CM | POA: Diagnosis not present

## 2017-04-23 ENCOUNTER — Other Ambulatory Visit: Payer: Self-pay | Admitting: Family Medicine

## 2017-04-23 DIAGNOSIS — M810 Age-related osteoporosis without current pathological fracture: Secondary | ICD-10-CM

## 2017-05-07 ENCOUNTER — Other Ambulatory Visit: Payer: Self-pay | Admitting: Family Medicine

## 2017-05-07 DIAGNOSIS — Z1231 Encounter for screening mammogram for malignant neoplasm of breast: Secondary | ICD-10-CM

## 2017-05-14 DIAGNOSIS — Z85828 Personal history of other malignant neoplasm of skin: Secondary | ICD-10-CM | POA: Diagnosis not present

## 2017-05-14 DIAGNOSIS — L720 Epidermal cyst: Secondary | ICD-10-CM | POA: Diagnosis not present

## 2017-05-28 DIAGNOSIS — L570 Actinic keratosis: Secondary | ICD-10-CM | POA: Diagnosis not present

## 2017-06-19 ENCOUNTER — Ambulatory Visit
Admission: RE | Admit: 2017-06-19 | Discharge: 2017-06-19 | Disposition: A | Discharge status: Home / Self Care | Payer: Medicare Other | Source: Ambulatory Visit | Attending: Family Medicine | Admitting: Family Medicine

## 2017-06-19 DIAGNOSIS — Z1231 Encounter for screening mammogram for malignant neoplasm of breast: Secondary | ICD-10-CM | POA: Diagnosis not present

## 2017-06-19 DIAGNOSIS — Z780 Asymptomatic menopausal state: Secondary | ICD-10-CM | POA: Diagnosis not present

## 2017-06-19 DIAGNOSIS — M810 Age-related osteoporosis without current pathological fracture: Secondary | ICD-10-CM | POA: Diagnosis not present

## 2017-07-03 DIAGNOSIS — M7542 Impingement syndrome of left shoulder: Secondary | ICD-10-CM | POA: Diagnosis not present

## 2017-07-03 DIAGNOSIS — M25511 Pain in right shoulder: Secondary | ICD-10-CM | POA: Diagnosis not present

## 2017-07-03 DIAGNOSIS — M19011 Primary osteoarthritis, right shoulder: Secondary | ICD-10-CM | POA: Diagnosis not present

## 2017-07-03 DIAGNOSIS — M19012 Primary osteoarthritis, left shoulder: Secondary | ICD-10-CM | POA: Diagnosis not present

## 2017-07-03 DIAGNOSIS — M7541 Impingement syndrome of right shoulder: Secondary | ICD-10-CM | POA: Diagnosis not present

## 2017-07-07 ENCOUNTER — Other Ambulatory Visit: Payer: Self-pay | Admitting: Orthopedic Surgery

## 2017-07-07 DIAGNOSIS — M7662 Achilles tendinitis, left leg: Secondary | ICD-10-CM

## 2017-07-09 ENCOUNTER — Ambulatory Visit
Admission: RE | Admit: 2017-07-09 | Discharge: 2017-07-09 | Disposition: A | Discharge status: Home / Self Care | Payer: Medicare Other | Source: Ambulatory Visit | Attending: Orthopedic Surgery | Admitting: Orthopedic Surgery

## 2017-07-09 DIAGNOSIS — M25572 Pain in left ankle and joints of left foot: Secondary | ICD-10-CM | POA: Diagnosis not present

## 2017-07-09 DIAGNOSIS — M7662 Achilles tendinitis, left leg: Secondary | ICD-10-CM

## 2017-07-10 ENCOUNTER — Emergency Department (HOSPITAL_BASED_OUTPATIENT_CLINIC_OR_DEPARTMENT_OTHER): Payer: Medicare Other

## 2017-07-10 ENCOUNTER — Emergency Department (HOSPITAL_BASED_OUTPATIENT_CLINIC_OR_DEPARTMENT_OTHER)
Admission: EM | Admit: 2017-07-10 | Discharge: 2017-07-10 | Disposition: A | Discharge status: Home / Self Care | Payer: Medicare Other | Attending: Emergency Medicine | Admitting: Emergency Medicine

## 2017-07-10 ENCOUNTER — Other Ambulatory Visit: Payer: Self-pay

## 2017-07-10 ENCOUNTER — Encounter (HOSPITAL_BASED_OUTPATIENT_CLINIC_OR_DEPARTMENT_OTHER): Payer: Self-pay | Admitting: Emergency Medicine

## 2017-07-10 DIAGNOSIS — G459 Transient cerebral ischemic attack, unspecified: Secondary | ICD-10-CM | POA: Diagnosis not present

## 2017-07-10 DIAGNOSIS — Z85850 Personal history of malignant neoplasm of thyroid: Secondary | ICD-10-CM | POA: Insufficient documentation

## 2017-07-10 DIAGNOSIS — R4781 Slurred speech: Secondary | ICD-10-CM | POA: Diagnosis not present

## 2017-07-10 DIAGNOSIS — Z85828 Personal history of other malignant neoplasm of skin: Secondary | ICD-10-CM | POA: Insufficient documentation

## 2017-07-10 DIAGNOSIS — I10 Essential (primary) hypertension: Secondary | ICD-10-CM | POA: Insufficient documentation

## 2017-07-10 DIAGNOSIS — Z96651 Presence of right artificial knee joint: Secondary | ICD-10-CM | POA: Insufficient documentation

## 2017-07-10 DIAGNOSIS — E039 Hypothyroidism, unspecified: Secondary | ICD-10-CM | POA: Insufficient documentation

## 2017-07-10 DIAGNOSIS — R4701 Aphasia: Secondary | ICD-10-CM | POA: Diagnosis present

## 2017-07-10 DIAGNOSIS — Z79899 Other long term (current) drug therapy: Secondary | ICD-10-CM | POA: Insufficient documentation

## 2017-07-10 LAB — CBC WITH DIFFERENTIAL/PLATELET
BASOS ABS: 0 10*3/uL (ref 0.0–0.1)
Basophils Relative: 0 %
Eosinophils Absolute: 0 10*3/uL (ref 0.0–0.7)
Eosinophils Relative: 0 %
HCT: 41.3 % (ref 36.0–46.0)
Hemoglobin: 14.7 g/dL (ref 12.0–15.0)
LYMPHS ABS: 1.4 10*3/uL (ref 0.7–4.0)
Lymphocytes Relative: 12 %
MCH: 31.5 pg (ref 26.0–34.0)
MCHC: 35.6 g/dL (ref 30.0–36.0)
MCV: 88.6 fL (ref 78.0–100.0)
MONO ABS: 0.7 10*3/uL (ref 0.1–1.0)
MONOS PCT: 6 %
Neutro Abs: 9.9 10*3/uL — ABNORMAL HIGH (ref 1.7–7.7)
Neutrophils Relative %: 82 %
Platelets: 361 10*3/uL (ref 150–400)
RBC: 4.66 MIL/uL (ref 3.87–5.11)
RDW: 13.5 % (ref 11.5–15.5)
WBC: 12 10*3/uL — AB (ref 4.0–10.5)

## 2017-07-10 LAB — URINALYSIS, ROUTINE W REFLEX MICROSCOPIC
Bilirubin Urine: NEGATIVE
GLUCOSE, UA: NEGATIVE mg/dL
HGB URINE DIPSTICK: NEGATIVE
KETONES UR: NEGATIVE mg/dL
LEUKOCYTES UA: NEGATIVE
Nitrite: NEGATIVE
Protein, ur: NEGATIVE mg/dL
Specific Gravity, Urine: 1.01 (ref 1.005–1.030)
pH: 6 (ref 5.0–8.0)

## 2017-07-10 LAB — COMPREHENSIVE METABOLIC PANEL
ALT: 22 U/L (ref 14–54)
AST: 26 U/L (ref 15–41)
Albumin: 4.2 g/dL (ref 3.5–5.0)
Alkaline Phosphatase: 87 U/L (ref 38–126)
Anion gap: 10 (ref 5–15)
BILIRUBIN TOTAL: 0.7 mg/dL (ref 0.3–1.2)
BUN: 21 mg/dL — AB (ref 6–20)
CO2: 24 mmol/L (ref 22–32)
CREATININE: 0.68 mg/dL (ref 0.44–1.00)
Calcium: 8.9 mg/dL (ref 8.9–10.3)
Chloride: 99 mmol/L — ABNORMAL LOW (ref 101–111)
GFR calc Af Amer: >60 mL/min (ref >60)
Glucose, Bld: 149 mg/dL — ABNORMAL HIGH (ref 65–99)
POTASSIUM: 4.1 mmol/L (ref 3.5–5.1)
Sodium: 133 mmol/L — ABNORMAL LOW (ref 135–145)
TOTAL PROTEIN: 7.2 g/dL (ref 6.5–8.1)

## 2017-07-10 LAB — TROPONIN I

## 2017-07-10 NOTE — ED Triage Notes (Addendum)
Patient reports on Wednesday she was trying to go to the bathroom but could not get the words out when she was telling someone else.  States that she was able to eventually get the words out and aphasia resolved. Denies dizziness, weakness, numbness, tingling, difficulty with speech after this event occurred.  States she came in today because she began thinking about the event more and it worried her.  Denies any symptoms at present.

## 2017-07-10 NOTE — ED Provider Notes (Signed)
[facility] EMERGENCY DEPARTMENT Provider Note   CSN: [id number] Arrival date & time: 07/10/17  1323     History   Chief Complaint Chief Complaint  Patient presents with  . Aphasia    HPI Lorraine Brandt is a 85 y.o. female.  The history is provided by the patient. No language interpreter was used.    Lorraine Brandt is a 84 y.o. female who presents to the Emergency Department complaining of aphasia.  She presents for evaluation of a aphasia that occurred yesterday.  When she was preparing for an MRI of her Achilles tendon yesterday she experienced an episode of jumbled speech and difficulty getting the words out that lasted less than 1 minute.  She was then able to speak clearly.  She had no associated headache, numbness, weakness, vision changes.  No prior similar symptoms.  No recent illnesses.  Today she talked to her daughter who recommended coming in to the emergency department for evaluation.  She does have a history of hypertension, thyroid cancer and basal cell carcinoma.  Past Medical History:  Diagnosis Date  . Arthritis    oa  . Cancer ([place])    basal cell skin cancer, multiple areas removed, thryoid  . DJD (degenerative joint disease)   . GERD (gastroesophageal reflux disease)    takes prilosec   . Hypertension   . Hypothyroidism since 2001  . Osteoporosis   . Polymyalgia rheumatica ([place])   . Sleep apnea    uses mouthpiece somnodent, pt wishes to leave mouthpiece at home, pt told moderate severe sleep apnea  . Thyroid disease   . Vaginal delivery 1958, 1960,1963    Patient Active Problem List   Diagnosis Date Noted  . Postoperative state 11/02/2014  . OA (osteoarthritis) of knee 04/25/2014    Past Surgical History:  Procedure Laterality Date  . ANTERIOR AND POSTERIOR REPAIR N/A 11/02/2014   Procedure: ANTERIOR (CYSTOCELE) Repair AND Possible POSTERIOR REPAIR (RECTOCELE);  Surgeon: Princess Bruins, MD;  Location: [facility];  Service: Gynecology;   Laterality: N/A;  . CYSTOSCOPY N/A 11/02/2014   Procedure: CYSTOSCOPY;  Surgeon: Princess Bruins, MD;  Location: [facility];  Service: Gynecology;  Laterality: N/A;  . DILATION AND CURETTAGE OF UTERUS    . EYE SURGERY  2007   both eyes lens replacements for cataracts  . LAMINECTOMY    . ROBOTIC ASSISTED LAPAROSCOPIC SACROCOLPOPEXY N/A 11/02/2014   Procedure: ROBOTIC ASSISTED LAPAROSCOPIC Uterosacral ligament suspension for SACROCOLPOPEXY;  Surgeon: Princess Bruins, MD;  Location: [facility];  Service: Gynecology;  Laterality: N/A;  . ROBOTIC ASSISTED TOTAL HYSTERECTOMY WITH BILATERAL SALPINGO OOPHERECTOMY Bilateral 11/02/2014   Procedure: ROBOTIC ASSISTED TOTAL HYSTERECTOMY WITH BILATERAL SALPINGO OOPHORECTOMY;  Surgeon: Princess Bruins, MD;  Location: [facility];  Service: Gynecology;  Laterality: Bilateral;  . THYROIDECTOMY  2001   and radiation done also, partial  . TONSILLECTOMY    . TOTAL KNEE ARTHROPLASTY Right 04/25/2014   Procedure: RIGHT TOTAL KNEE ARTHROPLASTY;  Surgeon: Gearlean Alf, MD;  Location: [facility];  Service: Orthopedics;  Laterality: Right;  . TUBAL LIGATION      OB History    No data available       Home Medications    Prior to Admission medications   Medication Sig Start Date End Date Taking? Authorizing Provider  losartan (COZAAR) 100 MG tablet Take 100 mg by mouth daily.   Yes [provider]  CALCIUM-VITAMIN D PO Take 1 tablet by mouth daily.    [provider]  diltiazem (CARDIZEM CD) 300 MG 24 hr capsule Take 300 mg by mouth every morning.     [provider]  glucosamine-chondroitin 500-400 MG tablet Take 1 tablet by mouth daily.    [provider]  hydrochlorothiazide (HYDRODIURIL) 25 MG tablet Take 25 mg by mouth every morning.     [provider]  HYDROcodone-acetaminophen (NORCO/VICODIN) 5-325 MG per tablet Take 1 tablet by mouth every 6 (six) hours as needed for moderate pain. 11/03/14   Princess Bruins, MD    levothyroxine (SYNTHROID, LEVOTHROID) 125 MCG tablet Take 125 mcg by mouth every morning. On an empty stomach.    [provider]  Multiple Vitamin (MULTIVITAMIN WITH MINERALS) TABS tablet Take 1 tablet by mouth daily.    [provider]  Omega-3 Fatty Acids (FISH OIL PO) Take 1 capsule by mouth daily.    [provider]  omeprazole (PRILOSEC) 20 MG capsule Take 20 mg by mouth every morning.     [provider]    Family History Family History  Problem Relation Age of Onset  . Breast cancer Daughter     Social History Social History   Tobacco Use  . Smoking status: Never Smoker  . Smokeless tobacco: Never Used  Substance Use Topics  . Alcohol use: Yes    Alcohol/week: 0.6 oz    Types: 1 Glasses of wine per week    Comment: wine 4 ounces most days  . Drug use: No     Allergies   Oxycodone   Review of Systems Review of Systems  All other systems reviewed and are negative.    Physical Exam Updated Vital Signs BP (!) 169/81 (BP Location: Left Arm)   Pulse 73   Temp 98.8 F (37.1 C)   Resp 16   Ht 5\' 1"  (1.549 m)   Wt 56.2 kg (124 lb)   SpO2 98%   BMI 23.43 kg/m   Physical Exam  Constitutional: She is oriented to person, place, and time. She appears well-developed and well-nourished.  HENT:  Head: Normocephalic and atraumatic.  Cardiovascular: Normal rate and regular rhythm.  No murmur heard. Pulmonary/Chest: Effort normal and breath sounds normal. No respiratory distress.  Abdominal: Soft. There is no tenderness. There is no rebound and no guarding.  Musculoskeletal: She exhibits no edema or tenderness.  Neurological: She is alert and oriented to person, place, and time. No cranial nerve deficit. Coordination normal.  No pronator drift.  No ataxia on FTN bilaterally.  5/5 strength in all four extremities,  Sensation to light touch intact in all four extremities.  Visual fields grossly intact.    Skin: Skin is warm and dry.   Psychiatric: She has a normal mood and affect. Her behavior is normal.  Nursing note and vitals reviewed.    ED Treatments / Results  Labs (all labs ordered are listed, but only abnormal results are displayed) Labs Reviewed  COMPREHENSIVE METABOLIC PANEL - Abnormal; Notable for the following components:      Result Value   Sodium 133 (*)    Chloride 99 (*)    Glucose, Bld 149 (*)    BUN 21 (*)    All other components within normal limits  CBC WITH DIFFERENTIAL/PLATELET - Abnormal; Notable for the following components:   WBC 12.0 (*)    Neutro Abs 9.9 (*)    All other components within normal limits  URINALYSIS, ROUTINE W REFLEX MICROSCOPIC  TROPONIN I    EKG  EKG Interpretation  Date/Time:  Friday July 10 2017 16:38:00 EST Ventricular Rate:  68 PR Interval:    QRS Duration: 85 QT Interval:  395 QTC Calculation: 421 R Axis:   17 Text Interpretation:  Sinus rhythm Confirmed by Quintella Reichert [phone]) on 07/10/2017 6:55:16 PM       Radiology Ct Head Wo Contrast  Result Date: 07/10/2017 CLINICAL DATA:  Transient slurred speech yesterday EXAM: CT HEAD WITHOUT CONTRAST TECHNIQUE: Contiguous axial images were obtained from the base of the skull through the vertex without intravenous contrast. COMPARISON:  None. FINDINGS: Brain: No evidence of acute infarction, hemorrhage, hydrocephalus, extra-axial collection or mass lesion/mass effect. Subcortical white matter and periventricular small vessel ischemic changes. Vascular: No hyperdense vessel or unexpected calcification. Skull: Normal. Negative for fracture or focal lesion. Sinuses/Orbits: The visualized paranasal sinuses are essentially clear. The mastoid air cells are unopacified. Other: None. IMPRESSION: No evidence of acute intracranial abnormality. Small vessel ischemic changes. Electronically Signed   By: Julian Hy M.D.   On: 07/10/2017 17:45   Mr Ankle Left Wo Contrast  Result Date: 07/09/2017 CLINICAL DATA:  Left  heel pain for 3-4 months.  No known injury. EXAM: MRI OF THE LEFT ANKLE WITHOUT CONTRAST TECHNIQUE: Multiplanar, multisequence MR imaging of the ankle was performed. No intravenous contrast was administered. COMPARISON:  None. FINDINGS: TENDONS Peroneal: Intact. Posteromedial: Intact. Anterior: Intact. Achilles: Approximately 6.3 cm of the Achilles tendon is thickened with a convex anterior border and mild intrasubstance increased T2 signal. Thickening begins approximately 1.5 cm above the tendon's insertion on the calcaneus. Trace amount of fluid is seen in the retrocalcaneal bursa. Dorsal process of the calcaneus has a normal appearance. Plantar Fascia: Normal. LIGAMENTS Lateral: Intact. Medial: Intact. CARTILAGE Ankle Joint: Negative. Subtalar Joints/Sinus Tarsi: Negative. Bones: Normal marrow signal throughout. Other: None. IMPRESSION: The examination is positive for noninsertional Achilles tendinopathy without tear. No other abnormality is identified. Electronically Signed   By: Inge Rise M.D.   On: 07/09/2017 14:53    Procedures Procedures (including critical care time)  Medications Ordered in ED Medications - No data to display   Initial Impression / Assessment and Plan / ED Course  I have reviewed the triage vital signs and the nursing notes.  Pertinent labs & imaging results that were available during my care of the patient were reviewed by me and considered in my medical decision making (see chart for details).     Patient here for evaluation of expressive a aphasia that lasted less than 1 minute yesterday.  She is neurologically intact on examination in the department.  Discussed with on-call neurologist who recommends TIA workup given her complaints.  CT head with no acute changes.  CBC demonstrates stable and chronic leukocytosis.  BMP with stable hyponatremia.  UA is not consistent with UTI.  EKG with no arrhythmia.  Discussed with patient recommendations for observation  admission for TIA workup.  Patient declines admission to the hospital and would like to follow-up as an outpatient.  Discussed with patient importance of obtaining full workup for TIA given her symptoms and discussed her increased risk of stroke and she understands.  Return precautions discussed.  Final Clinical Impressions(s) / ED Diagnoses   Final diagnoses:  TIA (transient ischemic attack)    ED Discharge Orders    None       Quintella Reichert, MD 07/11/17 0000

## 2017-07-10 NOTE — Discharge Instructions (Signed)
Start taking a baby aspirin every day. Get rechecked immediately if you have any new or concerning symptoms.  See your doctor immediately for further work up and testing.

## 2017-07-10 NOTE — ED Notes (Signed)
Pt. Up to restroom walked steady gait with no difficulty to restroom and back to room 11.

## 2017-07-15 ENCOUNTER — Other Ambulatory Visit (HOSPITAL_COMMUNITY): Payer: Self-pay | Admitting: Family Medicine

## 2017-07-15 DIAGNOSIS — G459 Transient cerebral ischemic attack, unspecified: Secondary | ICD-10-CM

## 2017-07-22 ENCOUNTER — Ambulatory Visit (HOSPITAL_BASED_OUTPATIENT_CLINIC_OR_DEPARTMENT_OTHER)
Admission: RE | Admit: 2017-07-22 | Discharge: 2017-07-22 | Disposition: A | Discharge status: Home / Self Care | Payer: Medicare Other | Source: Ambulatory Visit | Attending: Family Medicine | Admitting: Family Medicine

## 2017-07-22 ENCOUNTER — Ambulatory Visit (HOSPITAL_COMMUNITY)
Admission: RE | Admit: 2017-07-22 | Discharge: 2017-07-22 | Disposition: A | Discharge status: Home / Self Care | Payer: Medicare Other | Source: Ambulatory Visit | Attending: Family Medicine | Admitting: Family Medicine

## 2017-07-22 DIAGNOSIS — G459 Transient cerebral ischemic attack, unspecified: Secondary | ICD-10-CM

## 2017-07-22 DIAGNOSIS — I6523 Occlusion and stenosis of bilateral carotid arteries: Secondary | ICD-10-CM | POA: Insufficient documentation

## 2017-07-22 DIAGNOSIS — I051 Rheumatic mitral insufficiency: Secondary | ICD-10-CM | POA: Diagnosis not present

## 2017-07-22 NOTE — Progress Notes (Signed)
Bilateral lower extremity venous duplex has been completed. 1-39% ICA stenosis bilaterally.  07/22/17 2:23 PM Lorraine Brandt RVT

## 2017-07-22 NOTE — Progress Notes (Signed)
  Echocardiogram 2D Echocardiogram has been performed.  Starlena Beil T Noor Witte 07/22/2017, 4:19 PM

## 2017-09-16 DIAGNOSIS — D2261 Melanocytic nevi of right upper limb, including shoulder: Secondary | ICD-10-CM | POA: Diagnosis not present

## 2017-09-16 DIAGNOSIS — D485 Neoplasm of uncertain behavior of skin: Secondary | ICD-10-CM | POA: Diagnosis not present

## 2017-09-16 DIAGNOSIS — L814 Other melanin hyperpigmentation: Secondary | ICD-10-CM | POA: Diagnosis not present

## 2017-09-16 DIAGNOSIS — L720 Epidermal cyst: Secondary | ICD-10-CM | POA: Diagnosis not present

## 2017-09-16 DIAGNOSIS — D1801 Hemangioma of skin and subcutaneous tissue: Secondary | ICD-10-CM | POA: Diagnosis not present

## 2017-09-16 DIAGNOSIS — D692 Other nonthrombocytopenic purpura: Secondary | ICD-10-CM | POA: Diagnosis not present

## 2017-09-16 DIAGNOSIS — L821 Other seborrheic keratosis: Secondary | ICD-10-CM | POA: Diagnosis not present

## 2017-09-16 DIAGNOSIS — C44519 Basal cell carcinoma of skin of other part of trunk: Secondary | ICD-10-CM | POA: Diagnosis not present

## 2017-09-16 DIAGNOSIS — L570 Actinic keratosis: Secondary | ICD-10-CM | POA: Diagnosis not present

## 2017-09-16 DIAGNOSIS — Z85828 Personal history of other malignant neoplasm of skin: Secondary | ICD-10-CM | POA: Diagnosis not present

## 2017-09-16 DIAGNOSIS — D225 Melanocytic nevi of trunk: Secondary | ICD-10-CM | POA: Diagnosis not present

## 2017-09-16 DIAGNOSIS — D2271 Melanocytic nevi of right lower limb, including hip: Secondary | ICD-10-CM | POA: Diagnosis not present

## 2017-09-30 DIAGNOSIS — Z85828 Personal history of other malignant neoplasm of skin: Secondary | ICD-10-CM | POA: Diagnosis not present

## 2017-09-30 DIAGNOSIS — C44519 Basal cell carcinoma of skin of other part of trunk: Secondary | ICD-10-CM | POA: Diagnosis not present

## 2017-10-22 DIAGNOSIS — Z0000 Encounter for general adult medical examination without abnormal findings: Secondary | ICD-10-CM | POA: Diagnosis not present

## 2017-10-22 DIAGNOSIS — G4733 Obstructive sleep apnea (adult) (pediatric): Secondary | ICD-10-CM | POA: Diagnosis not present

## 2017-10-22 DIAGNOSIS — M810 Age-related osteoporosis without current pathological fracture: Secondary | ICD-10-CM | POA: Diagnosis not present

## 2017-10-22 DIAGNOSIS — E7800 Pure hypercholesterolemia, unspecified: Secondary | ICD-10-CM | POA: Diagnosis not present

## 2017-10-22 DIAGNOSIS — Z85850 Personal history of malignant neoplasm of thyroid: Secondary | ICD-10-CM | POA: Diagnosis not present

## 2017-10-22 DIAGNOSIS — F411 Generalized anxiety disorder: Secondary | ICD-10-CM | POA: Diagnosis not present

## 2017-10-22 DIAGNOSIS — I10 Essential (primary) hypertension: Secondary | ICD-10-CM | POA: Diagnosis not present

## 2017-10-22 DIAGNOSIS — E890 Postprocedural hypothyroidism: Secondary | ICD-10-CM | POA: Diagnosis not present

## 2017-10-28 DIAGNOSIS — Z0000 Encounter for general adult medical examination without abnormal findings: Secondary | ICD-10-CM | POA: Diagnosis not present

## 2017-11-01 ENCOUNTER — Emergency Department (HOSPITAL_BASED_OUTPATIENT_CLINIC_OR_DEPARTMENT_OTHER)
Admission: EM | Admit: 2017-11-01 | Discharge: 2017-11-01 | Disposition: A | Discharge status: Home / Self Care | Payer: Medicare Other | Attending: Emergency Medicine | Admitting: Emergency Medicine

## 2017-11-01 ENCOUNTER — Other Ambulatory Visit: Payer: Self-pay

## 2017-11-01 ENCOUNTER — Encounter (HOSPITAL_BASED_OUTPATIENT_CLINIC_OR_DEPARTMENT_OTHER): Payer: Self-pay | Admitting: Emergency Medicine

## 2017-11-01 DIAGNOSIS — T50905A Adverse effect of unspecified drugs, medicaments and biological substances, initial encounter: Secondary | ICD-10-CM

## 2017-11-01 DIAGNOSIS — R21 Rash and other nonspecific skin eruption: Secondary | ICD-10-CM | POA: Diagnosis not present

## 2017-11-01 DIAGNOSIS — Y658 Other specified misadventures during surgical and medical care: Secondary | ICD-10-CM | POA: Diagnosis not present

## 2017-11-01 DIAGNOSIS — E039 Hypothyroidism, unspecified: Secondary | ICD-10-CM | POA: Insufficient documentation

## 2017-11-01 DIAGNOSIS — I10 Essential (primary) hypertension: Secondary | ICD-10-CM | POA: Diagnosis not present

## 2017-11-01 DIAGNOSIS — T887XXA Unspecified adverse effect of drug or medicament, initial encounter: Secondary | ICD-10-CM | POA: Insufficient documentation

## 2017-11-01 DIAGNOSIS — L299 Pruritus, unspecified: Secondary | ICD-10-CM | POA: Diagnosis not present

## 2017-11-01 DIAGNOSIS — R000 Tachycardia, unspecified: Secondary | ICD-10-CM | POA: Diagnosis not present

## 2017-11-01 DIAGNOSIS — Z96651 Presence of right artificial knee joint: Secondary | ICD-10-CM | POA: Insufficient documentation

## 2017-11-01 DIAGNOSIS — T368X5A Adverse effect of other systemic antibiotics, initial encounter: Secondary | ICD-10-CM | POA: Diagnosis not present

## 2017-11-01 MED ORDER — LACTATED RINGERS IV BOLUS
1000.0000 mL | Freq: Once | INTRAVENOUS | Status: AC
  Administered 2017-11-01: 1000 mL via INTRAVENOUS

## 2017-11-01 MED ORDER — FAMOTIDINE IN NACL 20-0.9 MG/50ML-% IV SOLN
20.0000 mg | Freq: Once | INTRAVENOUS | Status: AC
  Administered 2017-11-01: 20 mg via INTRAVENOUS
  Filled 2017-11-01: qty 50

## 2017-11-01 MED ORDER — DIPHENHYDRAMINE HCL 25 MG PO TABS: 25.0000 mg | ORAL_TABLET | Freq: Three times a day (TID) | ORAL | 0 refills | Status: DC | PRN

## 2017-11-01 MED ORDER — DIPHENHYDRAMINE HCL 50 MG/ML IJ SOLN
25.0000 mg | Freq: Once | INTRAMUSCULAR | Status: AC
  Administered 2017-11-01: 25 mg via INTRAVENOUS
  Filled 2017-11-01: qty 1

## 2017-11-01 MED ORDER — DEXAMETHASONE SODIUM PHOSPHATE 10 MG/ML IJ SOLN
10.0000 mg | Freq: Once | INTRAMUSCULAR | Status: AC
  Administered 2017-11-01: 10 mg via INTRAVENOUS
  Filled 2017-11-01: qty 1

## 2017-11-01 MED ORDER — ONDANSETRON HCL 4 MG/2ML IJ SOLN
4.0000 mg | Freq: Once | INTRAMUSCULAR | Status: AC
  Administered 2017-11-01: 4 mg via INTRAVENOUS
  Filled 2017-11-01: qty 2

## 2017-11-01 NOTE — ED Triage Notes (Signed)
Patient states that she took celebrex for her arthritis  early today and she started to have itching all over, and reddened skin. The patient states that she took some zyrtec prior to coming in. The patient states that the itching is getting better. Patient states that she is starting to get dizzy with the itching

## 2017-11-01 NOTE — Discharge Instructions (Addendum)
I think he had adverse reaction to the Celebrex.  This can happen sometimes with anti-inflammatories.  I do not think that this was necessarily an allergic reaction and deafly was not an anaphylactic reaction however I would avoid using medication if it happens again next and use it.  For now he can use Benadryl, 25 mg, as needed for itching.  If it does not work in 20 to 45 minutes he take a second dose.  If you start having severe nausea, vomiting any shortness of breath or passing out please return to the emergency department.  Otherwise follow-up with your primary doctor about further recommendations for different medications.

## 2017-11-01 NOTE — ED Notes (Signed)
Pt given crackers and dt coke.

## 2017-11-01 NOTE — ED Provider Notes (Signed)
Emergency Department Provider Note   I have reviewed the triage vital signs and the nursing notes.   HISTORY  Chief Complaint Allergic Reaction   HPI Lorraine Brandt is a 84 y.o. female with multiple medical problems documented below the presents to the emergency department today secondary to rash and itching.  Patient states that she took Celebrex and about 20-30 minutes later she had itching on her palms that progressed to burning and now she has itching and burning of both of her arms her lower abdomen and pelvic area.  She also noticed redness to his skin.  She has some nausea but that seems to improve and she had episode of blurry vision which is also improved.  At this time she is complains of itching.  She has improved with the Zyrtec that she took prior to coming but has not resolved. No other associated or modifying symptoms.    Past Medical History:  Diagnosis Date  . Arthritis    oa  . Cancer ([place])    basal cell skin cancer, multiple areas removed, thryoid  . DJD (degenerative joint disease)   . GERD (gastroesophageal reflux disease)    takes prilosec   . Hypertension   . Hypothyroidism since 2001  . Osteoporosis   . Polymyalgia rheumatica ([place])   . Sleep apnea    uses mouthpiece somnodent, pt wishes to leave mouthpiece at home, pt told moderate severe sleep apnea  . Thyroid disease   . Vaginal delivery 1958, 1960,1963    Patient Active Problem List   Diagnosis Date Noted  . Postoperative state 11/02/2014  . OA (osteoarthritis) of knee 04/25/2014    Past Surgical History:  Procedure Laterality Date  . ANTERIOR AND POSTERIOR REPAIR N/A 11/02/2014   Procedure: ANTERIOR (CYSTOCELE) Repair AND Possible POSTERIOR REPAIR (RECTOCELE);  Surgeon: Princess Bruins, MD;  Location: [facility];  Service: Gynecology;  Laterality: N/A;  . CYSTOSCOPY N/A 11/02/2014   Procedure: CYSTOSCOPY;  Surgeon: Princess Bruins, MD;  Location: [facility];  Service: Gynecology;  Laterality:  N/A;  . DILATION AND CURETTAGE OF UTERUS    . EYE SURGERY  2007   both eyes lens replacements for cataracts  . LAMINECTOMY    . ROBOTIC ASSISTED LAPAROSCOPIC SACROCOLPOPEXY N/A 11/02/2014   Procedure: ROBOTIC ASSISTED LAPAROSCOPIC Uterosacral ligament suspension for SACROCOLPOPEXY;  Surgeon: Princess Bruins, MD;  Location: [facility];  Service: Gynecology;  Laterality: N/A;  . ROBOTIC ASSISTED TOTAL HYSTERECTOMY WITH BILATERAL SALPINGO OOPHERECTOMY Bilateral 11/02/2014   Procedure: ROBOTIC ASSISTED TOTAL HYSTERECTOMY WITH BILATERAL SALPINGO OOPHORECTOMY;  Surgeon: Princess Bruins, MD;  Location: [facility];  Service: Gynecology;  Laterality: Bilateral;  . THYROIDECTOMY  2001   and radiation done also, partial  . TONSILLECTOMY    . TOTAL KNEE ARTHROPLASTY Right 04/25/2014   Procedure: RIGHT TOTAL KNEE ARTHROPLASTY;  Surgeon: Gearlean Alf, MD;  Location: [facility];  Service: Orthopedics;  Laterality: Right;  . TUBAL LIGATION      Current Outpatient Rx  . Order #: [id number] Class: Historical Med  . Order #: [id number] Class: Historical Med  . Order #: [id number] Class: Historical Med  . Order #: [id number] Class: Print  . Order #: [id number] Class: Historical Med  . Order #: [id number] Class: Historical Med  . Order #: [id number] Class: Print  . Order #: [id number] Class: Historical Med  . Order #: [id number] Class: Historical Med  . Order #: [id number] Class: Historical Med  . Order #: [id number] Class: Historical Med  . Order #: [id number] Class: Historical Med    Allergies Oxycodone and Celebrex [  celecoxib]  Family History  Problem Relation Age of Onset  . Breast cancer Daughter     Social History Social History   Tobacco Use  . Smoking status: Never Smoker  . Smokeless tobacco: Never Used  Substance Use Topics  . Alcohol use: Yes    Alcohol/week: 0.6 oz    Types: 1 Glasses of wine per week    Comment: wine 4 ounces most days  . Drug use: No    Review of Systems  All other systems negative  except as documented in the HPI. All pertinent positives and negatives as reviewed in the HPI. ____________________________________________   PHYSICAL EXAM:  VITAL SIGNS: ED Triage Vitals  Enc Vitals Group     BP 11/01/17 1721 116/64     Pulse Rate 11/01/17 1710 (!) 110     Resp 11/01/17 1710 18     Temp 11/01/17 1710 97.7 F (36.5 C)     Temp Source 11/01/17 1710 Oral     SpO2 11/01/17 1710 100 %     Weight 11/01/17 1710 124 lb (56.2 kg)     Height 11/01/17 1710 5\' 2"  (1.575 m)     Head Circumference --      Peak Flow --      Pain Score 11/01/17 1710 0     Pain Loc --      Pain Edu? --      Excl. in [place]? --     Constitutional: Alert and oriented. Well appearing and in no acute distress. Eyes: Conjunctivae are normal. PERRL. EOMI. Head: Atraumatic. Nose: No congestion/rhinnorhea. Mouth/Throat: Mucous membranes are moist.  Oropharynx non-erythematous. Neck: No stridor.  No meningeal signs.   Cardiovascular: Tachycardic rate, regular rhythm. Good peripheral circulation. Grossly normal heart sounds.   Respiratory: Normal respiratory effort.  No retractions. Lungs CTAB. Gastrointestinal: Soft and nontender. No distention.  Musculoskeletal: No lower extremity tenderness nor edema. No gross deformities of extremities. Neurologic:  Normal speech and language. No gross focal neurologic deficits are appreciated.  Skin: Diffuse erythema with multiple areas of excoriation.  No open skin this time.  No hives.   ____________________________________________    EKG   EKG Interpretation  Date/Time:  Sunday November 01 2017 17:19:23 EDT Ventricular Rate:  76 PR Interval:    QRS Duration: 80 QT Interval:  398 QTC Calculation: 448 R Axis:   -3 Text Interpretation:  Sinus rhythm Left ventricular hypertrophy No significant change since last tracing Confirmed by Merrily Pew [phone]) on 11/01/2017 6:23:04 PM       ____________________________________________     INITIAL IMPRESSION  / ASSESSMENT AND PLAN / ED COURSE  Possible allergic reaction however does look more like an iodine reaction.  Either way we will treat with supportive care.  No evidence of anaphylaxis at this time.  Will reevaluate for improvement.  On multiple re-evaluations patient still with any evidence of anaphylaxis.  Total resolution of symptoms.  Will discharge on supportive care.     Pertinent labs & imaging results that were available during my care of the patient were reviewed by me and considered in my medical decision making (see chart for details).  ____________________________________________  FINAL CLINICAL IMPRESSION(S) / ED DIAGNOSES  Final diagnoses:  Adverse effect of drug, initial encounter     MEDICATIONS GIVEN DURING THIS VISIT:  Medications  diphenhydrAMINE (BENADRYL) injection 25 mg (25 mg Intravenous Given 11/01/17 1732)  lactated ringers bolus 1,000 mL (0 mLs Intravenous Stopped 11/01/17 1849)  ondansetron (ZOFRAN) injection 4 mg (  4 mg Intravenous Given 11/01/17 1735)  famotidine (PEPCID) IVPB 20 mg premix (0 mg Intravenous Stopped 11/01/17 1846)  dexamethasone (DECADRON) injection 10 mg (10 mg Intravenous Given 11/01/17 1736)     NEW OUTPATIENT MEDICATIONS STARTED DURING THIS VISIT:  Discharge Medication List as of 11/01/2017  7:13 PM    START taking these medications   Details  diphenhydrAMINE (BENADRYL) 25 MG tablet Take 1 tablet (25 mg total) by mouth every 8 (eight) hours as needed for itching., Starting Sun 11/01/2017, Print        Note:  This note was prepared with assistance of Dragon voice recognition software. Occasional wrong-word or sound-a-like substitutions may have occurred due to the inherent limitations of voice recognition software.   Merrily Pew, MD 11/01/17 2251

## 2017-12-18 DIAGNOSIS — M7541 Impingement syndrome of right shoulder: Secondary | ICD-10-CM | POA: Diagnosis not present

## 2017-12-18 DIAGNOSIS — M7542 Impingement syndrome of left shoulder: Secondary | ICD-10-CM | POA: Diagnosis not present

## 2017-12-28 DIAGNOSIS — H349 Unspecified retinal vascular occlusion: Secondary | ICD-10-CM | POA: Diagnosis not present

## 2017-12-28 DIAGNOSIS — H524 Presbyopia: Secondary | ICD-10-CM | POA: Diagnosis not present

## 2017-12-28 DIAGNOSIS — H26491 Other secondary cataract, right eye: Secondary | ICD-10-CM | POA: Diagnosis not present

## 2017-12-28 DIAGNOSIS — H348112 Central retinal vein occlusion, right eye, stable: Secondary | ICD-10-CM | POA: Diagnosis not present

## 2017-12-31 DIAGNOSIS — H35041 Retinal micro-aneurysms, unspecified, right eye: Secondary | ICD-10-CM | POA: Diagnosis not present

## 2017-12-31 DIAGNOSIS — H26491 Other secondary cataract, right eye: Secondary | ICD-10-CM | POA: Diagnosis not present

## 2017-12-31 DIAGNOSIS — H35061 Retinal vasculitis, right eye: Secondary | ICD-10-CM | POA: Diagnosis not present

## 2017-12-31 DIAGNOSIS — H353131 Nonexudative age-related macular degeneration, bilateral, early dry stage: Secondary | ICD-10-CM | POA: Diagnosis not present

## 2017-12-31 DIAGNOSIS — H3561 Retinal hemorrhage, right eye: Secondary | ICD-10-CM | POA: Diagnosis not present

## 2017-12-31 DIAGNOSIS — H348112 Central retinal vein occlusion, right eye, stable: Secondary | ICD-10-CM | POA: Diagnosis not present

## 2018-01-07 DIAGNOSIS — Z6823 Body mass index (BMI) 23.0-23.9, adult: Secondary | ICD-10-CM | POA: Diagnosis not present

## 2018-01-07 DIAGNOSIS — Z8739 Personal history of other diseases of the musculoskeletal system and connective tissue: Secondary | ICD-10-CM | POA: Diagnosis not present

## 2018-01-07 DIAGNOSIS — H348112 Central retinal vein occlusion, right eye, stable: Secondary | ICD-10-CM | POA: Diagnosis not present

## 2018-01-07 DIAGNOSIS — H35061 Retinal vasculitis, right eye: Secondary | ICD-10-CM | POA: Diagnosis not present

## 2018-01-28 DIAGNOSIS — H348112 Central retinal vein occlusion, right eye, stable: Secondary | ICD-10-CM | POA: Diagnosis not present

## 2018-01-28 DIAGNOSIS — H35061 Retinal vasculitis, right eye: Secondary | ICD-10-CM | POA: Diagnosis not present

## 2018-01-28 DIAGNOSIS — H35041 Retinal micro-aneurysms, unspecified, right eye: Secondary | ICD-10-CM | POA: Diagnosis not present

## 2018-01-28 DIAGNOSIS — H3561 Retinal hemorrhage, right eye: Secondary | ICD-10-CM | POA: Diagnosis not present

## 2018-02-03 DIAGNOSIS — Z85828 Personal history of other malignant neoplasm of skin: Secondary | ICD-10-CM | POA: Diagnosis not present

## 2018-02-03 DIAGNOSIS — L570 Actinic keratosis: Secondary | ICD-10-CM | POA: Diagnosis not present

## 2018-02-08 DIAGNOSIS — H348112 Central retinal vein occlusion, right eye, stable: Secondary | ICD-10-CM | POA: Diagnosis not present

## 2018-02-08 DIAGNOSIS — Z6823 Body mass index (BMI) 23.0-23.9, adult: Secondary | ICD-10-CM | POA: Diagnosis not present

## 2018-02-08 DIAGNOSIS — H35061 Retinal vasculitis, right eye: Secondary | ICD-10-CM | POA: Diagnosis not present

## 2018-02-08 DIAGNOSIS — Z8739 Personal history of other diseases of the musculoskeletal system and connective tissue: Secondary | ICD-10-CM | POA: Diagnosis not present

## 2018-02-11 DIAGNOSIS — H35041 Retinal micro-aneurysms, unspecified, right eye: Secondary | ICD-10-CM | POA: Diagnosis not present

## 2018-02-11 DIAGNOSIS — H348112 Central retinal vein occlusion, right eye, stable: Secondary | ICD-10-CM | POA: Diagnosis not present

## 2018-02-11 DIAGNOSIS — H3561 Retinal hemorrhage, right eye: Secondary | ICD-10-CM | POA: Diagnosis not present

## 2018-02-11 DIAGNOSIS — H35061 Retinal vasculitis, right eye: Secondary | ICD-10-CM | POA: Diagnosis not present

## 2018-03-08 DIAGNOSIS — H35061 Retinal vasculitis, right eye: Secondary | ICD-10-CM | POA: Diagnosis not present

## 2018-03-08 DIAGNOSIS — H348112 Central retinal vein occlusion, right eye, stable: Secondary | ICD-10-CM | POA: Diagnosis not present

## 2018-03-08 DIAGNOSIS — Z8739 Personal history of other diseases of the musculoskeletal system and connective tissue: Secondary | ICD-10-CM | POA: Diagnosis not present

## 2018-03-08 DIAGNOSIS — Z6823 Body mass index (BMI) 23.0-23.9, adult: Secondary | ICD-10-CM | POA: Diagnosis not present

## 2018-03-08 DIAGNOSIS — Z7952 Long term (current) use of systemic steroids: Secondary | ICD-10-CM | POA: Diagnosis not present

## 2018-03-10 DIAGNOSIS — D649 Anemia, unspecified: Secondary | ICD-10-CM | POA: Diagnosis not present

## 2018-03-10 DIAGNOSIS — E559 Vitamin D deficiency, unspecified: Secondary | ICD-10-CM | POA: Diagnosis not present

## 2018-03-10 DIAGNOSIS — E890 Postprocedural hypothyroidism: Secondary | ICD-10-CM | POA: Diagnosis not present

## 2018-03-10 DIAGNOSIS — I779 Disorder of arteries and arterioles, unspecified: Secondary | ICD-10-CM | POA: Diagnosis not present

## 2018-03-10 DIAGNOSIS — G4733 Obstructive sleep apnea (adult) (pediatric): Secondary | ICD-10-CM | POA: Diagnosis not present

## 2018-03-10 DIAGNOSIS — Z6823 Body mass index (BMI) 23.0-23.9, adult: Secondary | ICD-10-CM | POA: Diagnosis not present

## 2018-03-10 DIAGNOSIS — M810 Age-related osteoporosis without current pathological fracture: Secondary | ICD-10-CM | POA: Diagnosis not present

## 2018-03-10 DIAGNOSIS — C73 Malignant neoplasm of thyroid gland: Secondary | ICD-10-CM | POA: Diagnosis not present

## 2018-03-10 DIAGNOSIS — M353 Polymyalgia rheumatica: Secondary | ICD-10-CM | POA: Diagnosis not present

## 2018-03-10 DIAGNOSIS — E871 Hypo-osmolality and hyponatremia: Secondary | ICD-10-CM | POA: Diagnosis not present

## 2018-03-10 DIAGNOSIS — G459 Transient cerebral ischemic attack, unspecified: Secondary | ICD-10-CM | POA: Diagnosis not present

## 2018-03-11 DIAGNOSIS — H3561 Retinal hemorrhage, right eye: Secondary | ICD-10-CM | POA: Diagnosis not present

## 2018-03-11 DIAGNOSIS — H35061 Retinal vasculitis, right eye: Secondary | ICD-10-CM | POA: Diagnosis not present

## 2018-03-11 DIAGNOSIS — H348112 Central retinal vein occlusion, right eye, stable: Secondary | ICD-10-CM | POA: Diagnosis not present

## 2018-03-11 DIAGNOSIS — H35041 Retinal micro-aneurysms, unspecified, right eye: Secondary | ICD-10-CM | POA: Diagnosis not present

## 2018-04-05 DIAGNOSIS — H3561 Retinal hemorrhage, right eye: Secondary | ICD-10-CM | POA: Diagnosis not present

## 2018-04-05 DIAGNOSIS — H35061 Retinal vasculitis, right eye: Secondary | ICD-10-CM | POA: Diagnosis not present

## 2018-04-05 DIAGNOSIS — H34811 Central retinal vein occlusion, right eye, with macular edema: Secondary | ICD-10-CM | POA: Diagnosis not present

## 2018-04-05 DIAGNOSIS — H35041 Retinal micro-aneurysms, unspecified, right eye: Secondary | ICD-10-CM | POA: Diagnosis not present

## 2018-04-05 DIAGNOSIS — H353131 Nonexudative age-related macular degeneration, bilateral, early dry stage: Secondary | ICD-10-CM | POA: Diagnosis not present

## 2018-04-12 DIAGNOSIS — E871 Hypo-osmolality and hyponatremia: Secondary | ICD-10-CM | POA: Diagnosis not present

## 2018-04-12 DIAGNOSIS — H348112 Central retinal vein occlusion, right eye, stable: Secondary | ICD-10-CM | POA: Diagnosis not present

## 2018-04-12 DIAGNOSIS — Z7952 Long term (current) use of systemic steroids: Secondary | ICD-10-CM | POA: Diagnosis not present

## 2018-04-12 DIAGNOSIS — Z6823 Body mass index (BMI) 23.0-23.9, adult: Secondary | ICD-10-CM | POA: Diagnosis not present

## 2018-04-12 DIAGNOSIS — H35061 Retinal vasculitis, right eye: Secondary | ICD-10-CM | POA: Diagnosis not present

## 2018-04-12 DIAGNOSIS — Z8739 Personal history of other diseases of the musculoskeletal system and connective tissue: Secondary | ICD-10-CM | POA: Diagnosis not present

## 2018-04-22 DIAGNOSIS — H35041 Retinal micro-aneurysms, unspecified, right eye: Secondary | ICD-10-CM | POA: Diagnosis not present

## 2018-04-22 DIAGNOSIS — H34811 Central retinal vein occlusion, right eye, with macular edema: Secondary | ICD-10-CM | POA: Diagnosis not present

## 2018-04-22 DIAGNOSIS — H26491 Other secondary cataract, right eye: Secondary | ICD-10-CM | POA: Diagnosis not present

## 2018-04-22 DIAGNOSIS — H3561 Retinal hemorrhage, right eye: Secondary | ICD-10-CM | POA: Diagnosis not present

## 2018-04-28 DIAGNOSIS — I10 Essential (primary) hypertension: Secondary | ICD-10-CM | POA: Diagnosis not present

## 2018-04-28 DIAGNOSIS — M810 Age-related osteoporosis without current pathological fracture: Secondary | ICD-10-CM | POA: Diagnosis not present

## 2018-04-28 DIAGNOSIS — Z23 Encounter for immunization: Secondary | ICD-10-CM | POA: Diagnosis not present

## 2018-04-28 DIAGNOSIS — K210 Gastro-esophageal reflux disease with esophagitis: Secondary | ICD-10-CM | POA: Diagnosis not present

## 2018-04-28 DIAGNOSIS — F411 Generalized anxiety disorder: Secondary | ICD-10-CM | POA: Diagnosis not present

## 2018-05-10 DIAGNOSIS — H3561 Retinal hemorrhage, right eye: Secondary | ICD-10-CM | POA: Diagnosis not present

## 2018-05-10 DIAGNOSIS — H34811 Central retinal vein occlusion, right eye, with macular edema: Secondary | ICD-10-CM | POA: Diagnosis not present

## 2018-05-10 DIAGNOSIS — H26491 Other secondary cataract, right eye: Secondary | ICD-10-CM | POA: Diagnosis not present

## 2018-05-10 DIAGNOSIS — H35041 Retinal micro-aneurysms, unspecified, right eye: Secondary | ICD-10-CM | POA: Diagnosis not present

## 2018-05-10 DIAGNOSIS — H353131 Nonexudative age-related macular degeneration, bilateral, early dry stage: Secondary | ICD-10-CM | POA: Diagnosis not present

## 2018-05-13 DIAGNOSIS — Z8739 Personal history of other diseases of the musculoskeletal system and connective tissue: Secondary | ICD-10-CM | POA: Diagnosis not present

## 2018-05-13 DIAGNOSIS — H348112 Central retinal vein occlusion, right eye, stable: Secondary | ICD-10-CM | POA: Diagnosis not present

## 2018-05-13 DIAGNOSIS — Z7952 Long term (current) use of systemic steroids: Secondary | ICD-10-CM | POA: Diagnosis not present

## 2018-05-13 DIAGNOSIS — Z6823 Body mass index (BMI) 23.0-23.9, adult: Secondary | ICD-10-CM | POA: Diagnosis not present

## 2018-05-13 DIAGNOSIS — H35061 Retinal vasculitis, right eye: Secondary | ICD-10-CM | POA: Diagnosis not present

## 2018-05-18 ENCOUNTER — Other Ambulatory Visit: Payer: Self-pay | Admitting: Family Medicine

## 2018-05-18 DIAGNOSIS — Z1231 Encounter for screening mammogram for malignant neoplasm of breast: Secondary | ICD-10-CM

## 2018-06-14 DIAGNOSIS — H34811 Central retinal vein occlusion, right eye, with macular edema: Secondary | ICD-10-CM | POA: Diagnosis not present

## 2018-06-14 DIAGNOSIS — H35041 Retinal micro-aneurysms, unspecified, right eye: Secondary | ICD-10-CM | POA: Diagnosis not present

## 2018-06-14 DIAGNOSIS — H3561 Retinal hemorrhage, right eye: Secondary | ICD-10-CM | POA: Diagnosis not present

## 2018-07-07 ENCOUNTER — Ambulatory Visit
Admission: RE | Admit: 2018-07-07 | Discharge: 2018-07-07 | Disposition: A | Discharge status: Home / Self Care | Payer: Medicare Other | Source: Ambulatory Visit | Attending: Family Medicine | Admitting: Family Medicine

## 2018-07-07 DIAGNOSIS — Z1231 Encounter for screening mammogram for malignant neoplasm of breast: Secondary | ICD-10-CM | POA: Diagnosis not present

## 2018-07-15 DIAGNOSIS — H34811 Central retinal vein occlusion, right eye, with macular edema: Secondary | ICD-10-CM | POA: Diagnosis not present

## 2018-07-15 DIAGNOSIS — Z8739 Personal history of other diseases of the musculoskeletal system and connective tissue: Secondary | ICD-10-CM | POA: Diagnosis not present

## 2018-07-15 DIAGNOSIS — H3561 Retinal hemorrhage, right eye: Secondary | ICD-10-CM | POA: Diagnosis not present

## 2018-08-03 DIAGNOSIS — Z8739 Personal history of other diseases of the musculoskeletal system and connective tissue: Secondary | ICD-10-CM | POA: Diagnosis not present

## 2018-08-03 DIAGNOSIS — M316 Other giant cell arteritis: Secondary | ICD-10-CM | POA: Diagnosis not present

## 2018-08-03 DIAGNOSIS — H34811 Central retinal vein occlusion, right eye, with macular edema: Secondary | ICD-10-CM | POA: Diagnosis not present

## 2018-08-03 DIAGNOSIS — H35061 Retinal vasculitis, right eye: Secondary | ICD-10-CM | POA: Diagnosis not present

## 2018-08-16 DIAGNOSIS — H35061 Retinal vasculitis, right eye: Secondary | ICD-10-CM | POA: Diagnosis not present

## 2018-08-16 DIAGNOSIS — M316 Other giant cell arteritis: Secondary | ICD-10-CM | POA: Diagnosis not present

## 2018-08-16 DIAGNOSIS — Z8739 Personal history of other diseases of the musculoskeletal system and connective tissue: Secondary | ICD-10-CM | POA: Diagnosis not present

## 2018-08-16 DIAGNOSIS — H34811 Central retinal vein occlusion, right eye, with macular edema: Secondary | ICD-10-CM | POA: Diagnosis not present

## 2018-09-23 DIAGNOSIS — H35061 Retinal vasculitis, right eye: Secondary | ICD-10-CM | POA: Diagnosis not present

## 2018-09-23 DIAGNOSIS — M316 Other giant cell arteritis: Secondary | ICD-10-CM | POA: Diagnosis not present

## 2018-09-23 DIAGNOSIS — Z8739 Personal history of other diseases of the musculoskeletal system and connective tissue: Secondary | ICD-10-CM | POA: Diagnosis not present

## 2018-09-23 DIAGNOSIS — H34811 Central retinal vein occlusion, right eye, with macular edema: Secondary | ICD-10-CM | POA: Diagnosis not present

## 2018-10-06 DIAGNOSIS — Z7952 Long term (current) use of systemic steroids: Secondary | ICD-10-CM | POA: Diagnosis not present

## 2018-10-06 DIAGNOSIS — H35061 Retinal vasculitis, right eye: Secondary | ICD-10-CM | POA: Diagnosis not present

## 2018-10-06 DIAGNOSIS — Z6823 Body mass index (BMI) 23.0-23.9, adult: Secondary | ICD-10-CM | POA: Diagnosis not present

## 2018-10-06 DIAGNOSIS — H348112 Central retinal vein occlusion, right eye, stable: Secondary | ICD-10-CM | POA: Diagnosis not present

## 2018-10-06 DIAGNOSIS — Z8739 Personal history of other diseases of the musculoskeletal system and connective tissue: Secondary | ICD-10-CM | POA: Diagnosis not present

## 2018-10-26 DIAGNOSIS — M316 Other giant cell arteritis: Secondary | ICD-10-CM | POA: Diagnosis not present

## 2018-10-26 DIAGNOSIS — H34811 Central retinal vein occlusion, right eye, with macular edema: Secondary | ICD-10-CM | POA: Diagnosis not present

## 2018-10-26 DIAGNOSIS — H35061 Retinal vasculitis, right eye: Secondary | ICD-10-CM | POA: Diagnosis not present

## 2018-10-26 DIAGNOSIS — Z8739 Personal history of other diseases of the musculoskeletal system and connective tissue: Secondary | ICD-10-CM | POA: Diagnosis not present

## 2018-10-29 DIAGNOSIS — F411 Generalized anxiety disorder: Secondary | ICD-10-CM | POA: Diagnosis not present

## 2018-10-29 DIAGNOSIS — E890 Postprocedural hypothyroidism: Secondary | ICD-10-CM | POA: Diagnosis not present

## 2018-10-29 DIAGNOSIS — I10 Essential (primary) hypertension: Secondary | ICD-10-CM | POA: Diagnosis not present

## 2018-10-29 DIAGNOSIS — M810 Age-related osteoporosis without current pathological fracture: Secondary | ICD-10-CM | POA: Diagnosis not present

## 2018-10-29 DIAGNOSIS — E7800 Pure hypercholesterolemia, unspecified: Secondary | ICD-10-CM | POA: Diagnosis not present

## 2018-10-29 DIAGNOSIS — Z0000 Encounter for general adult medical examination without abnormal findings: Secondary | ICD-10-CM | POA: Diagnosis not present

## 2018-11-02 DIAGNOSIS — H34811 Central retinal vein occlusion, right eye, with macular edema: Secondary | ICD-10-CM | POA: Diagnosis not present

## 2018-11-02 DIAGNOSIS — M316 Other giant cell arteritis: Secondary | ICD-10-CM | POA: Diagnosis not present

## 2018-11-02 DIAGNOSIS — H35061 Retinal vasculitis, right eye: Secondary | ICD-10-CM | POA: Diagnosis not present

## 2018-11-02 DIAGNOSIS — H3561 Retinal hemorrhage, right eye: Secondary | ICD-10-CM | POA: Diagnosis not present

## 2018-11-11 DIAGNOSIS — Z85828 Personal history of other malignant neoplasm of skin: Secondary | ICD-10-CM | POA: Diagnosis not present

## 2018-11-11 DIAGNOSIS — D1801 Hemangioma of skin and subcutaneous tissue: Secondary | ICD-10-CM | POA: Diagnosis not present

## 2018-11-11 DIAGNOSIS — D3617 Benign neoplasm of peripheral nerves and autonomic nervous system of trunk, unspecified: Secondary | ICD-10-CM | POA: Diagnosis not present

## 2018-11-11 DIAGNOSIS — D2271 Melanocytic nevi of right lower limb, including hip: Secondary | ICD-10-CM | POA: Diagnosis not present

## 2018-11-11 DIAGNOSIS — L821 Other seborrheic keratosis: Secondary | ICD-10-CM | POA: Diagnosis not present

## 2018-11-11 DIAGNOSIS — D692 Other nonthrombocytopenic purpura: Secondary | ICD-10-CM | POA: Diagnosis not present

## 2018-11-11 DIAGNOSIS — L814 Other melanin hyperpigmentation: Secondary | ICD-10-CM | POA: Diagnosis not present

## 2018-12-07 DIAGNOSIS — H34811 Central retinal vein occlusion, right eye, with macular edema: Secondary | ICD-10-CM | POA: Diagnosis not present

## 2018-12-07 DIAGNOSIS — M316 Other giant cell arteritis: Secondary | ICD-10-CM | POA: Diagnosis not present

## 2019-01-07 DIAGNOSIS — Z7952 Long term (current) use of systemic steroids: Secondary | ICD-10-CM | POA: Diagnosis not present

## 2019-01-07 DIAGNOSIS — H348112 Central retinal vein occlusion, right eye, stable: Secondary | ICD-10-CM | POA: Diagnosis not present

## 2019-01-07 DIAGNOSIS — Z8739 Personal history of other diseases of the musculoskeletal system and connective tissue: Secondary | ICD-10-CM | POA: Diagnosis not present

## 2019-01-07 DIAGNOSIS — Z6823 Body mass index (BMI) 23.0-23.9, adult: Secondary | ICD-10-CM | POA: Diagnosis not present

## 2019-01-07 DIAGNOSIS — H35061 Retinal vasculitis, right eye: Secondary | ICD-10-CM | POA: Diagnosis not present

## 2019-01-11 ENCOUNTER — Other Ambulatory Visit: Payer: Self-pay | Admitting: Physician Assistant

## 2019-01-11 DIAGNOSIS — I739 Peripheral vascular disease, unspecified: Secondary | ICD-10-CM

## 2019-01-18 DIAGNOSIS — Z8739 Personal history of other diseases of the musculoskeletal system and connective tissue: Secondary | ICD-10-CM | POA: Diagnosis not present

## 2019-01-18 DIAGNOSIS — H35061 Retinal vasculitis, right eye: Secondary | ICD-10-CM | POA: Diagnosis not present

## 2019-01-18 DIAGNOSIS — M316 Other giant cell arteritis: Secondary | ICD-10-CM | POA: Diagnosis not present

## 2019-01-18 DIAGNOSIS — H34811 Central retinal vein occlusion, right eye, with macular edema: Secondary | ICD-10-CM | POA: Diagnosis not present

## 2019-01-20 ENCOUNTER — Other Ambulatory Visit: Payer: Self-pay

## 2019-01-20 ENCOUNTER — Ambulatory Visit
Admission: RE | Admit: 2019-01-20 | Discharge: 2019-01-20 | Disposition: A | Discharge status: Home / Self Care | Payer: Medicare Other | Source: Ambulatory Visit | Attending: Physician Assistant | Admitting: Physician Assistant

## 2019-01-20 DIAGNOSIS — Z8679 Personal history of other diseases of the circulatory system: Secondary | ICD-10-CM | POA: Diagnosis not present

## 2019-01-20 DIAGNOSIS — I739 Peripheral vascular disease, unspecified: Secondary | ICD-10-CM

## 2019-02-11 DIAGNOSIS — Z961 Presence of intraocular lens: Secondary | ICD-10-CM | POA: Diagnosis not present

## 2019-02-11 DIAGNOSIS — H348112 Central retinal vein occlusion, right eye, stable: Secondary | ICD-10-CM | POA: Diagnosis not present

## 2019-02-11 DIAGNOSIS — H349 Unspecified retinal vascular occlusion: Secondary | ICD-10-CM | POA: Diagnosis not present

## 2019-02-11 DIAGNOSIS — H524 Presbyopia: Secondary | ICD-10-CM | POA: Diagnosis not present

## 2019-02-21 DIAGNOSIS — H35363 Drusen (degenerative) of macula, bilateral: Secondary | ICD-10-CM | POA: Diagnosis not present

## 2019-02-21 DIAGNOSIS — H3561 Retinal hemorrhage, right eye: Secondary | ICD-10-CM | POA: Diagnosis not present

## 2019-02-21 DIAGNOSIS — H348112 Central retinal vein occlusion, right eye, stable: Secondary | ICD-10-CM | POA: Diagnosis not present

## 2019-02-21 DIAGNOSIS — H35373 Puckering of macula, bilateral: Secondary | ICD-10-CM | POA: Diagnosis not present

## 2019-03-09 DIAGNOSIS — E890 Postprocedural hypothyroidism: Secondary | ICD-10-CM | POA: Diagnosis not present

## 2019-03-09 DIAGNOSIS — H3411 Central retinal artery occlusion, right eye: Secondary | ICD-10-CM | POA: Diagnosis not present

## 2019-03-09 DIAGNOSIS — I779 Disorder of arteries and arterioles, unspecified: Secondary | ICD-10-CM | POA: Diagnosis not present

## 2019-03-09 DIAGNOSIS — E871 Hypo-osmolality and hyponatremia: Secondary | ICD-10-CM | POA: Diagnosis not present

## 2019-03-09 DIAGNOSIS — M353 Polymyalgia rheumatica: Secondary | ICD-10-CM | POA: Diagnosis not present

## 2019-03-09 DIAGNOSIS — D6489 Other specified anemias: Secondary | ICD-10-CM | POA: Diagnosis not present

## 2019-03-09 DIAGNOSIS — M810 Age-related osteoporosis without current pathological fracture: Secondary | ICD-10-CM | POA: Diagnosis not present

## 2019-03-09 DIAGNOSIS — Z79899 Other long term (current) drug therapy: Secondary | ICD-10-CM | POA: Diagnosis not present

## 2019-03-09 DIAGNOSIS — C73 Malignant neoplasm of thyroid gland: Secondary | ICD-10-CM | POA: Diagnosis not present

## 2019-03-09 DIAGNOSIS — E559 Vitamin D deficiency, unspecified: Secondary | ICD-10-CM | POA: Diagnosis not present

## 2019-03-09 DIAGNOSIS — G4733 Obstructive sleep apnea (adult) (pediatric): Secondary | ICD-10-CM | POA: Diagnosis not present

## 2019-03-14 DIAGNOSIS — C73 Malignant neoplasm of thyroid gland: Secondary | ICD-10-CM | POA: Diagnosis not present

## 2019-03-17 DIAGNOSIS — H348192 Central retinal vein occlusion, unspecified eye, stable: Secondary | ICD-10-CM | POA: Diagnosis not present

## 2019-03-24 DIAGNOSIS — Z85828 Personal history of other malignant neoplasm of skin: Secondary | ICD-10-CM | POA: Diagnosis not present

## 2019-03-24 DIAGNOSIS — L218 Other seborrheic dermatitis: Secondary | ICD-10-CM | POA: Diagnosis not present

## 2019-03-24 DIAGNOSIS — L570 Actinic keratosis: Secondary | ICD-10-CM | POA: Diagnosis not present

## 2019-03-28 DIAGNOSIS — H3582 Retinal ischemia: Secondary | ICD-10-CM | POA: Diagnosis not present

## 2019-03-28 DIAGNOSIS — H35373 Puckering of macula, bilateral: Secondary | ICD-10-CM | POA: Diagnosis not present

## 2019-03-28 DIAGNOSIS — H34811 Central retinal vein occlusion, right eye, with macular edema: Secondary | ICD-10-CM | POA: Diagnosis not present

## 2019-03-28 DIAGNOSIS — H35363 Drusen (degenerative) of macula, bilateral: Secondary | ICD-10-CM | POA: Diagnosis not present

## 2019-04-12 DIAGNOSIS — Z6823 Body mass index (BMI) 23.0-23.9, adult: Secondary | ICD-10-CM | POA: Diagnosis not present

## 2019-04-12 DIAGNOSIS — Z7952 Long term (current) use of systemic steroids: Secondary | ICD-10-CM | POA: Diagnosis not present

## 2019-04-12 DIAGNOSIS — H348112 Central retinal vein occlusion, right eye, stable: Secondary | ICD-10-CM | POA: Diagnosis not present

## 2019-04-12 DIAGNOSIS — H35061 Retinal vasculitis, right eye: Secondary | ICD-10-CM | POA: Diagnosis not present

## 2019-04-12 DIAGNOSIS — Z8739 Personal history of other diseases of the musculoskeletal system and connective tissue: Secondary | ICD-10-CM | POA: Diagnosis not present

## 2019-04-18 ENCOUNTER — Emergency Department (HOSPITAL_COMMUNITY): Payer: Medicare Other

## 2019-04-18 ENCOUNTER — Other Ambulatory Visit: Payer: Self-pay

## 2019-04-18 ENCOUNTER — Emergency Department (HOSPITAL_COMMUNITY)
Admission: EM | Admit: 2019-04-18 | Discharge: 2019-04-19 | Disposition: A | Discharge status: Home / Self Care | Payer: Medicare Other | Attending: Emergency Medicine | Admitting: Emergency Medicine

## 2019-04-18 ENCOUNTER — Encounter (HOSPITAL_COMMUNITY): Payer: Self-pay | Admitting: Emergency Medicine

## 2019-04-18 DIAGNOSIS — Z91040 Latex allergy status: Secondary | ICD-10-CM | POA: Insufficient documentation

## 2019-04-18 DIAGNOSIS — I10 Essential (primary) hypertension: Secondary | ICD-10-CM | POA: Diagnosis not present

## 2019-04-18 DIAGNOSIS — Z7982 Long term (current) use of aspirin: Secondary | ICD-10-CM | POA: Diagnosis not present

## 2019-04-18 DIAGNOSIS — H4321 Crystalline deposits in vitreous body, right eye: Secondary | ICD-10-CM | POA: Diagnosis not present

## 2019-04-18 DIAGNOSIS — H35363 Drusen (degenerative) of macula, bilateral: Secondary | ICD-10-CM | POA: Diagnosis not present

## 2019-04-18 DIAGNOSIS — H35373 Puckering of macula, bilateral: Secondary | ICD-10-CM | POA: Diagnosis not present

## 2019-04-18 DIAGNOSIS — H547 Unspecified visual loss: Secondary | ICD-10-CM

## 2019-04-18 DIAGNOSIS — H538 Other visual disturbances: Secondary | ICD-10-CM | POA: Insufficient documentation

## 2019-04-18 DIAGNOSIS — H5461 Unqualified visual loss, right eye, normal vision left eye: Secondary | ICD-10-CM | POA: Diagnosis not present

## 2019-04-18 DIAGNOSIS — H34211 Partial retinal artery occlusion, right eye: Secondary | ICD-10-CM | POA: Diagnosis not present

## 2019-04-18 DIAGNOSIS — Z79899 Other long term (current) drug therapy: Secondary | ICD-10-CM | POA: Insufficient documentation

## 2019-04-18 DIAGNOSIS — E039 Hypothyroidism, unspecified: Secondary | ICD-10-CM | POA: Insufficient documentation

## 2019-04-18 DIAGNOSIS — H3582 Retinal ischemia: Secondary | ICD-10-CM | POA: Diagnosis not present

## 2019-04-18 DIAGNOSIS — H34231 Retinal artery branch occlusion, right eye: Secondary | ICD-10-CM | POA: Diagnosis not present

## 2019-04-18 LAB — COMPREHENSIVE METABOLIC PANEL
ALT: 15 U/L (ref 0–44)
AST: 24 U/L (ref 15–41)
Albumin: 4.1 g/dL (ref 3.5–5.0)
Alkaline Phosphatase: 86 U/L (ref 38–126)
Anion gap: 11 (ref 5–15)
BUN: 16 mg/dL (ref 8–23)
CO2: 27 mmol/L (ref 22–32)
Calcium: 9.7 mg/dL (ref 8.9–10.3)
Chloride: 97 mmol/L — ABNORMAL LOW (ref 98–111)
Creatinine, Ser: 0.74 mg/dL (ref 0.44–1.00)
GFR calc Af Amer: >60 mL/min (ref >60)
GFR calc non Af Amer: >60 mL/min (ref >60)
Glucose, Bld: 93 mg/dL (ref 70–99)
Potassium: 4.2 mmol/L (ref 3.5–5.1)
Sodium: 135 mmol/L (ref 135–145)
Total Bilirubin: 0.5 mg/dL (ref 0.3–1.2)
Total Protein: 7.1 g/dL (ref 6.5–8.1)

## 2019-04-18 LAB — DIFFERENTIAL
Abs Immature Granulocytes: 0.07 10*3/uL (ref 0.00–0.07)
Basophils Absolute: 0 10*3/uL (ref 0.0–0.1)
Basophils Relative: 0 %
Eosinophils Absolute: 0.2 10*3/uL (ref 0.0–0.5)
Eosinophils Relative: 2 %
Immature Granulocytes: 1 %
Lymphocytes Relative: 22 %
Lymphs Abs: 2.3 10*3/uL (ref 0.7–4.0)
Monocytes Absolute: 0.7 10*3/uL (ref 0.1–1.0)
Monocytes Relative: 7 %
Neutro Abs: 7.1 10*3/uL (ref 1.7–7.7)
Neutrophils Relative %: 68 %

## 2019-04-18 LAB — ETHANOL: Alcohol, Ethyl (B): <10 mg/dL (ref <10)

## 2019-04-18 LAB — CBC
HCT: 44.3 % (ref 36.0–46.0)
Hemoglobin: 15.3 g/dL — ABNORMAL HIGH (ref 12.0–15.0)
MCH: 32.3 pg (ref 26.0–34.0)
MCHC: 34.5 g/dL (ref 30.0–36.0)
MCV: 93.5 fL (ref 80.0–100.0)
Platelets: 368 10*3/uL (ref 150–400)
RBC: 4.74 MIL/uL (ref 3.87–5.11)
RDW: 11.9 % (ref 11.5–15.5)
WBC: 10.3 10*3/uL (ref 4.0–10.5)
nRBC: 0 % (ref 0.0–0.2)

## 2019-04-18 LAB — APTT: aPTT: 29 seconds (ref 24–36)

## 2019-04-18 LAB — PROTIME-INR
INR: 0.9 (ref 0.8–1.2)
Prothrombin Time: 12.2 seconds (ref 11.4–15.2)

## 2019-04-18 MED ORDER — IOHEXOL 350 MG/ML SOLN
75.0000 mL | Freq: Once | INTRAVENOUS | Status: AC | PRN
  Administered 2019-04-18: 75 mL via INTRAVENOUS

## 2019-04-18 NOTE — ED Notes (Signed)
Pt is not a code stroke or LVO at this time per PA [place].

## 2019-04-18 NOTE — ED Provider Notes (Signed)
[facility] EMERGENCY DEPARTMENT Provider Note   CSN: [id number] Arrival date & time: 04/18/19  1335     History   Chief Complaint Chief Complaint  Patient presents with   Loss of Vision    HPI Lorraine Brandt is a 85 y.o. female.     HPI Patient presents with concern of right eye vision changes. She is here with her husband who assists with the HPI.  Lunch acknowledges multiple medical issues including generally poor vision at baseline. She notes that today, upon awakening after going to bed in her usual state of health last night, she awoke with worse than usual vision in her right eye, with preserved capacity only to appreciate color from the lower field of the right eye. She went to her ophthalmologist and was sent here for evaluation after she had an abnormal finding.  Patient denies of the weakness, confusion, disorientation, speech deficits, states that she takes all medication as directed, and as above, was in her usual state of health prior to awakening this morning.     Past Medical History:  Diagnosis Date   Arthritis    oa   Cancer ([place])    basal cell skin cancer, multiple areas removed, thryoid   DJD (degenerative joint disease)    GERD (gastroesophageal reflux disease)    takes prilosec    Hypertension    Hypothyroidism since 2001   Osteoporosis    Polymyalgia rheumatica ([place])    Sleep apnea    uses mouthpiece somnodent, pt wishes to leave mouthpiece at home, pt told moderate severe sleep apnea   Thyroid disease    Vaginal delivery 1958, 1960,1963    Patient Active Problem List   Diagnosis Date Noted   Postoperative state 11/02/2014   OA (osteoarthritis) of knee 04/25/2014    Past Surgical History:  Procedure Laterality Date   ANTERIOR AND POSTERIOR REPAIR N/A 11/02/2014   Procedure: ANTERIOR (CYSTOCELE) Repair AND Possible POSTERIOR REPAIR (RECTOCELE);  Surgeon: Princess Bruins, MD;  Location: [facility];   Service: Gynecology;  Laterality: N/A;   CYSTOSCOPY N/A 11/02/2014   Procedure: CYSTOSCOPY;  Surgeon: Princess Bruins, MD;  Location: [facility];  Service: Gynecology;  Laterality: N/A;   DILATION AND CURETTAGE OF UTERUS     EYE SURGERY  2007   both eyes lens replacements for cataracts   LAMINECTOMY     ROBOTIC ASSISTED LAPAROSCOPIC SACROCOLPOPEXY N/A 11/02/2014   Procedure: ROBOTIC ASSISTED LAPAROSCOPIC Uterosacral ligament suspension for SACROCOLPOPEXY;  Surgeon: Princess Bruins, MD;  Location: [facility];  Service: Gynecology;  Laterality: N/A;   ROBOTIC ASSISTED TOTAL HYSTERECTOMY WITH BILATERAL SALPINGO OOPHERECTOMY Bilateral 11/02/2014   Procedure: ROBOTIC ASSISTED TOTAL HYSTERECTOMY WITH BILATERAL SALPINGO OOPHORECTOMY;  Surgeon: Princess Bruins, MD;  Location: [facility];  Service: Gynecology;  Laterality: Bilateral;   THYROIDECTOMY  2001   and radiation done also, partial   TONSILLECTOMY     TOTAL KNEE ARTHROPLASTY Right 04/25/2014   Procedure: RIGHT TOTAL KNEE ARTHROPLASTY;  Surgeon: Gearlean Alf, MD;  Location: [facility];  Service: Orthopedics;  Laterality: Right;   TUBAL LIGATION       OB History   No obstetric history on file.      Home Medications    Prior to Admission medications   Medication Sig Start Date End Date Taking? Authorizing Provider  aspirin EC 81 MG tablet Take 81 mg by mouth daily.   Yes [provider]  CALCIUM-VITAMIN D PO Take 1 tablet by mouth daily.  Yes [provider]  cholecalciferol (VITAMIN D3) 25 MCG (1000 UT) tablet Take 1,000 Units by mouth daily.   Yes [provider]  diltiazem (CARDIZEM CD) 300 MG 24 hr capsule Take 300 mg by mouth every morning.    Yes [provider]  escitalopram (LEXAPRO) 5 MG tablet Take 5 mg by mouth daily. 04/01/19  Yes [provider]  levothyroxine (SYNTHROID, LEVOTHROID) 125 MCG tablet Take 125 mcg by mouth every morning. On an empty stomach.   Yes [provider]  losartan (COZAAR) 100 MG tablet Take 100 mg by mouth daily.   Yes [provider]  Multiple Vitamin (MULTIVITAMIN WITH MINERALS) TABS tablet Take 1 tablet by mouth daily.   Yes [provider]  Omega-3 Fatty Acids (FISH OIL PO) Take 1 capsule by mouth daily.   Yes [provider]  omeprazole (PRILOSEC) 20 MG capsule Take 20 mg by mouth every morning.    Yes [provider]    Family History Family History  Problem Relation Age of Onset   Breast cancer Daughter     Social History Social History   Tobacco Use   Smoking status: Never Smoker   Smokeless tobacco: Never Used  Substance Use Topics   Alcohol use: Yes    Alcohol/week: 1.0 standard drinks    Types: 1 Glasses of wine per week    Comment: wine 4 ounces most days   Drug use: No     Allergies   Oxycodone, Celebrex [celecoxib], and Latex   Review of Systems Review of Systems  Constitutional:       Per HPI, otherwise negative  HENT:       Per HPI, otherwise negative  Eyes: Positive for visual disturbance. Negative for pain and discharge.  Respiratory:       Per HPI, otherwise negative  Cardiovascular:       Per HPI, otherwise negative  Gastrointestinal: Negative for vomiting.  Endocrine:       Negative aside from HPI  Genitourinary:       Neg aside from HPI   Musculoskeletal:       Per HPI, otherwise negative  Skin: Negative.   Neurological: Negative for syncope.     Physical Exam Updated Vital Signs BP (!) 167/76    Pulse 78    Temp 98.1 F (36.7 C) (Oral)    Resp 19    SpO2 99%   Physical Exam Vitals signs and nursing note reviewed.  Constitutional:      General: She is not in acute distress.    Appearance: She is well-developed.  HENT:     Head: Normocephalic and atraumatic.  Eyes:     Conjunctiva/sclera: Conjunctivae normal.     Comments: Both eyes are dilated, extraocular motion intact bilaterally.  Acuity minimal right eye,  unremarkable according to the patient left eye.  Cardiovascular:     Rate and Rhythm: Normal rate and regular rhythm.  Pulmonary:     Effort: Pulmonary effort is normal. No respiratory distress.     Breath sounds: Normal breath sounds. No stridor.  Abdominal:     General: There is no distension.  Skin:    General: Skin is warm and dry.  Neurological:     Mental Status: She is alert and oriented to person, place, and time.     Cranial Nerves: No cranial nerve deficit.     Comments: Right eye with minimal visual capacity.  Otherwise cranial nerves unremarkable, mild atrophy, but  otherwise no focal neurologic deficits.      ED Treatments / Results  Labs (all labs ordered are listed, but only abnormal results are displayed) Labs Reviewed  CBC - Abnormal; Notable for the following components:      Result Value   Hemoglobin 15.3 (*)    All other components within normal limits  COMPREHENSIVE METABOLIC PANEL - Abnormal; Notable for the following components:   Chloride 97 (*)    All other components within normal limits  ETHANOL  PROTIME-INR  APTT  DIFFERENTIAL  RAPID URINE DRUG SCREEN, HOSP PERFORMED  URINALYSIS, ROUTINE W REFLEX MICROSCOPIC    EKG None  Radiology Ct Head Wo Contrast  Result Date: 04/18/2019 CLINICAL DATA:  Right-sided retinal artery occlusion. Acute vision loss right eye. EXAM: CT HEAD WITHOUT CONTRAST TECHNIQUE: Contiguous axial images were obtained from the base of the skull through the vertex without intravenous contrast. COMPARISON:  07/10/2017 FINDINGS: Brain: Normal appearance of the brain for age. Mild volume loss. Mild chronic small-vessel change of the deep white matter. No sign of acute infarction, mass lesion, hemorrhage, hydrocephalus or extra-axial collection. Vascular: There is atherosclerotic calcification of the major vessels at the base of the brain. 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  Sinuses/Orbits: Sinuses are clear. No orbital lesion by CT. Other: None IMPRESSION: No acute finding by CT. Mild age related atrophy and chronic small-vessel ischemic change of the deep white matter. Atherosclerotic calcification of the major vessels at the base of the brain. Electronically Signed   By: Nelson Chimes M.D.   On: 04/18/2019 15:41   Mr Brain Wo Contrast (neuro Protocol)  Result Date: 04/18/2019 CLINICAL DATA:  Focal neuro deficit, greater than 6 hours, stroke suspected. Right eye vision loss, CVA versus CRAO. EXAM: MRI HEAD WITHOUT CONTRAST TECHNIQUE: Multiplanar, multiecho pulse sequences of the brain and surrounding structures were obtained without intravenous contrast. COMPARISON:  Head CT 04/18/2019 FINDINGS: Brain: Multiple sequences are mildly motion degraded. There is no evidence of acute infarct. No evidence of intracranial mass. No midline shift or extra-axial fluid collection. No chronic intracranial blood products. Moderate scattered T2/FLAIR hyperintensity within the cerebral white matter is nonspecific, but consistent with chronic small vessel ischemic disease. Small chronic lacunar infarct within the posterior aspect of the left lentiform nucleus. Mild generalized parenchymal atrophy. Partially empty sella turcica. Vascular: Flow voids maintained within the proximal large arterial vessels. Skull and upper cervical spine: No focal marrow lesion. C3-C4 and C4-C5 grade 1 anterolisthesis. Sinuses/Orbits: Bilateral lens replacements. No significant paranasal sinus disease or mastoid effusion. IMPRESSION: 1. No evidence of acute intracranial abnormality, including acute infarct. 2. Generalized parenchymal atrophy with moderate chronic small vessel ischemic disease. Small chronic left basal ganglia lacunar infarct. Electronically Signed   By: Kellie Simmering DO   On: 04/18/2019 21:59    Procedures Procedures (including critical care time)  Medications Ordered in ED Medications - No data  to display   Initial Impression / Assessment and Plan / ED Course  I have reviewed the triage vital signs and the nursing notes.  Pertinent labs & imaging results that were available during my care of the patient were reviewed by me and considered in my medical decision making (see chart for details).    After the initial evaluation I reviewed the patient's chart, and fax results from her ophthalmologist earlier today. Patient diagnosed with partial retinal artery occlusion right eye, with puckering of the macula bilaterally.    With consideration of  stroke versus CR 8 oh versus low flow ischemic event, I discussed the patient's case with our neurology colleagues for imaging recommendations. Patient will have CT angiography head, neck, MRI.  I discussed the patient's presentation with her daughter who is an ophthalmologist.  11:43 PM  Patient in no distress, awake, alert, aware of MRI findings, as is her husband.  This elderly female presents with acute on chronic vision changes in the right eye, concern for possible stroke versus CRAO, with consideration of arterial stenosis versus occlusion versus event. Patient's MRI demonstrates old, but not new stroke, initial head CT is unremarkable, and the patient is awaiting CT angiography. Patient's daughter is an ophthalmologist that she is aware of her presentation, and on signout the patient is awaiting CT angiography, as above. Dr. Stark Jock is aware of the patient and her presentation.  Final Clinical Impressions(s) / ED Diagnoses  Visual loss   Carmin Muskrat, MD 04/18/19 2347

## 2019-04-18 NOTE — Consult Note (Signed)
Requesting Physician: Dr. Stark Jock    Chief Complaint: Right eye vision loss  History obtained from: Patient and Chart    HPI:                                                                                                                                       Lorraine Brandt is a 85 y.o. female with past medical history significant for hypertension, polymyalgia rheumatica, basal cell carcinoma, sleep apnea, central retinal vein occlusion who presents to the emergency department after her ophthalmologist diagnosed her with branch retinal artery occlusion.  Patient states that she woke up this morning with worsening vision in her right eye compared to baseline.  Most notably, vision was worse in the lower fields of the right eye. She went to her ophthalmologist who performed an angiogram and noticed that she had occlusion in the branch of retinal artery.  Was recommended to come to the emergency department for stroke work-up.  She denies any other neurological symptoms and has no prior history of strokes.  She does take aspirin 42m daily.  Denies any headaches, vision loss is painless.    Past Medical History:  Diagnosis Date  . Arthritis    oa  . Cancer ([place]    basal cell skin cancer, multiple areas removed, thryoid  . DJD (degenerative joint disease)   . GERD (gastroesophageal reflux disease)    takes prilosec   . Hypertension   . Hypothyroidism since 2001  . Osteoporosis   . Pol[place]algia rheumatica ([place]   . Sleep apnea    uses mouthpiece somnodent, pt wishes to leave mouthpiece at home, pt told moderate severe sleep apnea  . Thyroid disease   . Vaginal delivery 1958, [id number]   Past Surgical History:  Procedure Laterality Date  . ANTERIOR AND POSTERIOR REPAIR N/A 11/02/2014   Procedure: ANTERIOR (CYSTOCELE) Repair AND Possible POSTERIOR REPAIR (RECTOCELE);  Surgeon: MPrincess Bruins MD;  [facility];  Service: Gynecology;  Laterality: N/A;  . CYSTOSCOPY N/A  11/02/2014   Procedure: CYSTOSCOPY;  Surgeon: MPrincess Bruins MD;  [facility];  Service: Gynecology;  Laterality: N/A;  . DILATION AND CURETTAGE OF UTERUS    . EYE SURGERY  2007   both eyes lens replacements for cataracts  . LAMINECTOMY    . ROBOTIC ASSISTED LAPAROSCOPIC SACROCOLPOPEXY N/A 11/02/2014   Procedure: ROBOTIC ASSISTED LAPAROSCOPIC Uterosacral ligament suspension for SACROCOLPOPEXY;  Surgeon: MPrincess Bruins MD;  [facility];  Service: Gynecology;  Laterality: N/A;  . ROBOTIC ASSISTED TOTAL HYSTERECTOMY WITH BILATERAL SALPINGO OOPHERECTOMY Bilateral 11/02/2014   Procedure: ROBOTIC ASSISTED TOTAL HYSTERECTOMY WITH BILATERAL SALPINGO OOPHORECTOMY;  Surgeon: MPrincess Bruins MD;  [facility];  Service: Gynecology;  Laterality: Bilateral;  . THYROIDECTOMY  2001   and radiation done also, partial  . TONSILLECTOMY    . TOTAL KNEE ARTHROPLASTY Right 04/25/2014   Procedure: RIGHT TOTAL KNEE ARTHROPLASTY;  Surgeon:  Gearlean Alf, MD;  [facility];  Service: Orthopedics;  Laterality: Right;  . TUBAL LIGATION      Family History  Problem Relation Age of Onset  . Breast cancer Daughter    Social History:  reports that she has never smoked. She has never used smokeless tobacco. She reports current alcohol use of about 1.0 standard drinks of alcohol per week. She reports that she does not use drugs.  Allergies:  Allergies  Allergen Reactions  . Oxycodone Other (See Comments)    Makes me crazy  . Celebrex [Celecoxib]     Had itching, burning and rash to it. Likely adverse effect rather than allergic.  Marland Kitchen Latex Rash    Medications:                                                                                                                        I reviewed home medications   ROS:                                                                                                                                     14 systems reviewed and negative except  above    Examination:                                                                                                      General: Appears well-developed  Psych: Affect appropriate to situation Eyes: No scleral injection HENT: No OP obstrucion Head: Normocephalic.  Cardiovascular: Normal rate and regular rhythm.  Respiratory: Effort normal and breath sounds normal to anterior ascultation GI: Soft.  No distension. There is no tenderness.  Skin: WDI    Neurological Examination Mental Status: Alert, oriented, thought content appropriate.  Speech fluent without evidence of aphasia. Able to follow 3 step commands without difficulty. Cranial Nerves: II: Visual fields: Impaired visual acuity, able to identify shadows and finger count, normal in the left eye III,IV, VI: ptosis not present, extra-ocular motions intact bilaterally,  pupils equal, round, reactive to light and accommodation V,VII: smile symmetric, facial light touch sensation normal bilaterally VIII: hearing normal bilaterally IX,X: uvula rises symmetrically XI: bilateral shoulder shrug XII: midline tongue extension Motor: Right : Upper extremity   5/5    Left:     Upper extremity   5/5  Lower extremity   5/5     Lower extremity   5/5 Tone and bulk:normal tone throughout; no atrophy noted Sensory: Pinprick and light touch intact throughout, bilaterally Deep Tendon Reflexes: 2+ and symmetric throughout Plantars: Right: downgoing   Left: downgoing Cerebellar: normal finger-to-nose, normal rapid alternating movements and normal heel-to-shin test Gait: normal gait and station     Lab Results: Basic Metabolic Panel: Recent Labs  Lab 04/18/19 1353  NA 135  K 4.2  CL 97*  CO2 27  GLUCOSE 93  BUN 16  CREATININE 0.74  CALCIUM 9.7    CBC: Recent Labs  Lab 04/18/19 1353  WBC 10.3  NEUTROABS 7.1  HGB 15.3*  HCT 44.3  MCV 93.5  PLT 368    Coagulation Studies: Recent Labs    04/18/19 1353  LABPROT 12.2   INR 0.9    Imaging: Ct Head Wo Contrast  Result Date: 04/18/2019 CLINICAL DATA:  Right-sided retinal artery occlusion. Acute vision loss right eye. EXAM: CT HEAD WITHOUT CONTRAST TECHNIQUE: Contiguous axial images were obtained from the base of the skull through the vertex without intravenous contrast. COMPARISON:  07/10/2017 FINDINGS: Brain: Normal appearance of the brain for age. Mild volume loss. Mild chronic small-vessel change of the deep white matter. No sign of acute infarction, mass lesion, hemorrhage, hydrocephalus or extra-axial collection. Vascular: There is atherosclerotic calcification of the major vessels at the base of the brain. 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 Sinuses/Orbits: Sinuses are clear. No orbital lesion by CT. Other: None IMPRESSION: No acute finding by CT. Mild age related atrophy and chronic small-vessel ischemic change of the deep white matter. Atherosclerotic calcification of the major vessels at the base of the brain. Electronically Signed   By: Nelson Chimes M.D.   On: 04/18/2019 15:41   Mr Brain Wo Contrast (neuro Protocol)  Result Date: 04/18/2019 CLINICAL DATA:  Focal neuro deficit, greater than 6 hours, stroke suspected. Right eye vision loss, CVA versus CRAO. EXAM: MRI HEAD WITHOUT CONTRAST TECHNIQUE: Multiplanar, multiecho pulse sequences of the brain and surrounding structures were obtained without intravenous contrast. COMPARISON:  Head CT 04/18/2019 FINDINGS: Brain: Multiple sequences are mildly motion degraded. There is no evidence of acute infarct. No evidence of intracranial mass. No midline shift or extra-axial fluid collection. No chronic intracranial blood products. Moderate scattered T2/FLAIR hyperintensity within the cerebral white matter is nonspecific, but consistent with chronic small vessel ischemic disease. Small chronic lacunar infarct within the posterior aspect of the left lentiform nucleus. Mild  generalized parenchymal atrophy. Partially empty sella turcica. Vascular: Flow voids maintained within the proximal large arterial vessels. Skull and upper cervical spine: No focal marrow lesion. C3-C4 and C4-C5 grade 1 anterolisthesis. Sinuses/Orbits: Bilateral lens replacements. No significant paranasal sinus disease or mastoid effusion. IMPRESSION: 1. No evidence of acute intracranial abnormality, including acute infarct. 2. Generalized parenchymal atrophy with moderate chronic small vessel ischemic disease. Small chronic left basal ganglia lacunar infarct. Electronically Signed   By: Kellie Simmering DO   On: 04/18/2019 21:59     ASSESSMENT AND PLAN   Branch retinal artery occlusion  Recommendations CT angiogram head and neck shows no  significant atherosclerotic disease, stenosis. MRI brain negative for any acute infarcts or chronic cortical infarcts.  She does have white matter disease with small chronic left basal ganglia infarct. Echocardiogram, 30 day cardiac monitoring to evaluate for paroxysmal A. fib: Discussed with patient and said that she would prefer to have this done as outpatient Add Plavix 75 mg daily to home aspirin, continue for 3 weeks.  Then aspirin alone Atorvastatin 40 mg daily ESR and CRP, not completed in ER. However low suspicion of GCA.   Follow-up with neurology as outpatient as well as cardiology.  Sushanth Aroor [facility] Pager Number [phone]

## 2019-04-18 NOTE — ED Provider Notes (Signed)
MSE was initiated and I personally evaluated the patient and placed orders (if any) at  1:52 PM on April 18, 2019.  The patient appears stable so that the remainder of the MSE may be completed by another provider.  1350- spoke with Dr. Tamera Punt, not LVO criteria  [facility] Dr. Jule Economy MD reports that they have been seeing patient for a right-sided retinal vein occlusion for a long time.  She had a fluoroscopy angiogram 2 weeks ago that was at her baseline.  Today when she woke up she had decreased/blurry vision in the right eye and it was repeated with IV fluorescein dye and showed a right-sided retinal artery occlusion.  She reports that when she went to bed last night at 9 PM her vision was normal.  She denies any trauma.  No changes in vision in her left eye.  She is able to see colors out of the lower fields of her right eye, however is overall decreased.   No facial droop.  No slurred speech.  Normal gait.  5/5 grip strength bilaterally.    Stroke labs and non code stroke head CT ordered.    We discussed that her evaluation has been started and the importance of being seen for additional evaluation and the risks of failing to do so.      Lorin Glass, PA-C 04/18/19 1402    Malvin Johns, MD 04/18/19 1523

## 2019-04-18 NOTE — ED Triage Notes (Addendum)
Pt reports hx of retinal vein occlusion, states she has been treated by retinal specialist for it, this morning she woke up around 7 am with vision loss in R eye, LSN was 9pm last night. States that vision in R eye is normally not very good, but it was much worse this am. States her retina specialist did a scan of her retinal veins earlier today and sent her here for further evaluation. Speech clear, face symmetrical, pt moving all limbs equally without any difficulty. Ambulatory to triage. PA Hammond at bedside to assess patient.

## 2019-04-19 MED ORDER — CLOPIDOGREL BISULFATE 75 MG PO TABS: 75.0000 mg | ORAL_TABLET | Freq: Every day | ORAL | 0 refills | Status: DC

## 2019-04-19 MED ORDER — ATORVASTATIN CALCIUM 40 MG PO TABS: 10.0000 mg | ORAL_TABLET | Freq: Every day | ORAL | 0 refills | Status: DC

## 2019-04-19 MED ORDER — ATORVASTATIN CALCIUM 40 MG PO TABS: 40.0000 mg | ORAL_TABLET | Freq: Every day | ORAL | 0 refills | Status: DC

## 2019-04-19 NOTE — ED Notes (Signed)
Discharge instructions discussed with pt. Pt verbalized understanding. Pt stable and ambulatory. No signature pad available. 

## 2019-04-19 NOTE — Discharge Instructions (Addendum)
Begin taking Plavix and atorvastatin as prescribed.  Follow-up with both neurology and cardiology in the next week.  The contact information for the both offices have been provided in this discharge summary for you to call and make these arrangements.  Return to the emergency department in the meantime if you develop any new and/or worsening symptoms.

## 2019-04-19 NOTE — ED Provider Notes (Signed)
Care assumed from Dr. Vanita Panda at shift change.  Patient awaiting results of CT angiogram of the head and neck.  Patient with recent visual disturbances referred here for rule out of stroke as the cause.  Patient's CT angiograms are both negative.  Patient also evaluated by Dr. Lorraine Lax from neurology.  He is recommending Plavix for 3 weeks, initiating atorvastatin therapy, and follow-up with both cardiology and neurology in the near future.   Veryl Speak, MD 04/19/19 [phone]

## 2019-05-16 DIAGNOSIS — Z23 Encounter for immunization: Secondary | ICD-10-CM | POA: Diagnosis not present

## 2019-05-19 DIAGNOSIS — Z85828 Personal history of other malignant neoplasm of skin: Secondary | ICD-10-CM | POA: Diagnosis not present

## 2019-05-19 DIAGNOSIS — L723 Sebaceous cyst: Secondary | ICD-10-CM | POA: Diagnosis not present

## 2019-05-27 ENCOUNTER — Other Ambulatory Visit: Payer: Self-pay | Admitting: Endocrinology

## 2019-05-27 DIAGNOSIS — M810 Age-related osteoporosis without current pathological fracture: Secondary | ICD-10-CM

## 2019-05-31 DIAGNOSIS — H3582 Retinal ischemia: Secondary | ICD-10-CM | POA: Diagnosis not present

## 2019-05-31 DIAGNOSIS — H34811 Central retinal vein occlusion, right eye, with macular edema: Secondary | ICD-10-CM | POA: Diagnosis not present

## 2019-05-31 DIAGNOSIS — H35033 Hypertensive retinopathy, bilateral: Secondary | ICD-10-CM | POA: Diagnosis not present

## 2019-05-31 DIAGNOSIS — H34211 Partial retinal artery occlusion, right eye: Secondary | ICD-10-CM | POA: Diagnosis not present

## 2019-06-08 DIAGNOSIS — I779 Disorder of arteries and arterioles, unspecified: Secondary | ICD-10-CM | POA: Diagnosis not present

## 2019-06-08 DIAGNOSIS — H3411 Central retinal artery occlusion, right eye: Secondary | ICD-10-CM | POA: Diagnosis not present

## 2019-06-08 DIAGNOSIS — I679 Cerebrovascular disease, unspecified: Secondary | ICD-10-CM | POA: Diagnosis not present

## 2019-06-08 DIAGNOSIS — G4733 Obstructive sleep apnea (adult) (pediatric): Secondary | ICD-10-CM | POA: Diagnosis not present

## 2019-06-08 DIAGNOSIS — E890 Postprocedural hypothyroidism: Secondary | ICD-10-CM | POA: Diagnosis not present

## 2019-06-08 DIAGNOSIS — C73 Malignant neoplasm of thyroid gland: Secondary | ICD-10-CM | POA: Diagnosis not present

## 2019-06-08 DIAGNOSIS — E871 Hypo-osmolality and hyponatremia: Secondary | ICD-10-CM | POA: Diagnosis not present

## 2019-06-08 DIAGNOSIS — M810 Age-related osteoporosis without current pathological fracture: Secondary | ICD-10-CM | POA: Diagnosis not present

## 2019-06-13 ENCOUNTER — Other Ambulatory Visit: Payer: Self-pay | Admitting: Endocrinology

## 2019-06-13 DIAGNOSIS — Z1231 Encounter for screening mammogram for malignant neoplasm of breast: Secondary | ICD-10-CM

## 2019-06-13 DIAGNOSIS — Z23 Encounter for immunization: Secondary | ICD-10-CM | POA: Diagnosis not present

## 2019-07-07 ENCOUNTER — Encounter: Payer: Self-pay | Admitting: Cardiovascular Disease

## 2019-07-07 ENCOUNTER — Other Ambulatory Visit: Payer: Self-pay

## 2019-07-07 ENCOUNTER — Ambulatory Visit (INDEPENDENT_AMBULATORY_CARE_PROVIDER_SITE_OTHER): Payer: Medicare Other | Admitting: Cardiovascular Disease

## 2019-07-07 VITALS — BP 163/83 | HR 79 | Ht 62.0 in | Wt 130.0 lb

## 2019-07-07 DIAGNOSIS — E7800 Pure hypercholesterolemia, unspecified: Secondary | ICD-10-CM | POA: Diagnosis not present

## 2019-07-07 DIAGNOSIS — Z13220 Encounter for screening for lipoid disorders: Secondary | ICD-10-CM | POA: Diagnosis not present

## 2019-07-07 DIAGNOSIS — Z01810 Encounter for preprocedural cardiovascular examination: Secondary | ICD-10-CM

## 2019-07-07 DIAGNOSIS — Z01812 Encounter for preprocedural laboratory examination: Secondary | ICD-10-CM

## 2019-07-07 DIAGNOSIS — H3410 Central retinal artery occlusion, unspecified eye: Secondary | ICD-10-CM | POA: Diagnosis not present

## 2019-07-07 DIAGNOSIS — Z5181 Encounter for therapeutic drug level monitoring: Secondary | ICD-10-CM | POA: Diagnosis not present

## 2019-07-07 DIAGNOSIS — H3411 Central retinal artery occlusion, right eye: Secondary | ICD-10-CM

## 2019-07-07 DIAGNOSIS — I10 Essential (primary) hypertension: Secondary | ICD-10-CM | POA: Diagnosis not present

## 2019-07-07 HISTORY — DX: Central retinal artery occlusion, unspecified eye: H34.10

## 2019-07-07 HISTORY — DX: Essential (primary) hypertension: I10

## 2019-07-07 HISTORY — DX: Pure hypercholesterolemia, unspecified: E78.00

## 2019-07-07 MED ORDER — ROSUVASTATIN CALCIUM 20 MG PO TABS: 20.0000 mg | ORAL_TABLET | Freq: Every day | ORAL | 3 refills | Status: AC

## 2019-07-07 NOTE — Patient Instructions (Addendum)
Medication Instructions:  START ROSUVASTATIN 20 MG DAILY    *If you need a refill on your cardiac medications before your next appointment, please call your pharmacy*  Lab Work: 07/11/2019 AT 12:15 [facility]   FASTING LP/CMET IN 2 MONTHS  If you have labs (blood work) drawn today and your tests are completely normal, you will receive your results only by: Marland Kitchen MyChart Message (if you have MyChart) OR . A paper copy in the mail If you have any lab test that is abnormal or we need to change your treatment, we will call you to review the results.  Testing/Procedures: Your physician has requested that you have a TEE. During a TEE, sound waves are used to create images of your heart. It provides your doctor with information about the size and shape of your heart and how well your heart's chambers and valves are working. In this test, a transducer is attached to the end of a flexible tube that's guided down your throat and into your esophagus (the tube leading from you mouth to your stomach) to get a more detailed image of your heart. You are not awake for the procedure. Please see the instruction sheet given to you today. For further information please visit [email or web address].  Follow-Up: At [facility], you and your health needs are our priority.  As part of our continuing mission to provide you with exceptional heart care, we have created designated Provider Care Teams.  These Care Teams include your primary Cardiologist (physician) and Advanced Practice Providers (APPs -  Physician Assistants and Nurse Practitioners) who all work together to provide you with the care you need, when you need it.  Your next appointment:   4 month(s) You will receive a reminder letter in the mail two months in advance. If you don't receive a letter, please call our office to schedule the follow-up appointment.  The format for your next appointment:   In Person  Provider:   You may see DR [facility]  or one of the following Advanced Practice Providers on your designated Care Team:    Kerin Ransom, PA-C  [place], [place]  Coletta Memos, [place]   Other Instructions  You are scheduled for a TEE on 07/14/2019  with Dr. Harrington Challenger.  Please arrive at the [facility] (Main Entrance A) at [facility]: [address] [place], [place] [address] at 11 am (1 hour prior to procedure unless lab work is needed; if lab work is needed arrive 1.5 hours ahead)  DIET: Nothing to eat or drink after midnight except a sip of water with medications (see medication instructions below)  Medication Instructions:  TAKE YOUR MEDICATIONS WITH A SMALL AMOUNT OF WATER  Labs:  Come to: [place] TEST 07/11/2019  You must have a responsible person to drive you home and stay in the waiting area during your procedure. Failure to do so could result in cancellation.  Bring your insurance cards.  *Special Note: Every effort is made to have your procedure done on time. Occasionally there are emergencies that occur at the hospital that may cause delays. Please be patient if a delay does occur.    Transesophageal Echocardiogram Transesophageal echocardiogram (TEE) is a test that uses sound waves to take pictures of your heart. TEE is done by passing a flexible tube down the esophagus. The esophagus is the tube that carries food from the throat to the stomach. The pictures give detailed images of your heart.  This can help your doctor see if there are problems with your heart. What happens before the procedure? Staying hydrated Follow instructions from your doctor about hydration, which may include:  Up to 3 hours before the procedure - you may continue to drink clear liquids, such as: ? Water. ? Clear fruit juice. ? Black coffee. ? Plain tea.  Eating and drinking Follow instructions from your doctor about eating and drinking, which may include:  8 hours before the procedure -  stop eating heavy meals or foods such as meat, fried foods, or fatty foods.  6 hours before the procedure - stop eating light meals or foods, such as toast or cereal.  6 hours before the procedure - stop drinking milk or drinks that contain milk.  3 hours before the procedure - stop drinking clear liquids. General instructions  You will need to take out any dentures or retainers.  Plan to have someone take you home from the hospital or clinic.  If you will be going home right after the procedure, plan to have someone with you for 24 hours.  Ask your doctor about: ? Changing or stopping your normal medicines. This is important if you take diabetes medicines or blood thinners. ? Taking over-the-counter medicines, vitamins, herbs, and supplements. ? Taking medicines such as aspirin and ibuprofen. These medicines can thin your blood. Do not take these medicines unless your doctor tells you to take them. What happens during the procedure?  To lower your risk of infection, your doctors will wash or clean their hands.  An IV will be put into one of your veins.  You will be given a medicine to help you relax (sedative).  A medicine may be sprayed or gargled. This numbs the back of your throat.  Your blood pressure, heart rate, and breathing will be watched.  You may be asked to lay on your left side.  A bite block will be placed in your mouth. This keeps you from biting the tube.  The tip of the TEE probe will be placed into the back of your mouth.  You will be asked to swallow.  Your doctor will take pictures of your heart.  The probe and bite block will be taken out. The procedure may vary among doctors and hospitals. What happens after the procedure?   Your blood pressure, heart rate, breathing rate, and blood oxygen level will be watched until the medicines you were given have worn off.  When you first wake up, your throat may feel sore and numb. This will get better over  time. You will not be allowed to eat or drink until the numbness has gone away.  Do not drive for 24 hours if you were given a medicine to help you relax. Summary  TEE is a test that uses sound waves to take pictures of your heart.  You will be given a medicine to help you relax.  Do not drive for 24 hours if you were given a medicine to help you relax. This information is not intended to replace advice given to you by your health care provider. Make sure you discuss any questions you have with your health care provider. Document Revised: 01/15/2018 Document Reviewed: 07/30/2016 Elsevier Patient Education  [employer].

## 2019-07-07 NOTE — H&P (View-Only) (Signed)
Cardiology Office Note   Date:  07/07/2019   ID:  Lorraine Brandt, Lorraine Brandt 12/14/1933, MRN [id number]  PCP:  Lorraine Bowen, MD  Cardiologist:   Lorraine Latch, MD   No chief complaint on file.    History of Present Illness: Lorraine Brandt is a 86 y.o. female with hypertension, thyroid adenocarcinoma s/p resection, CRAO of the R eye who is being seen today for the evaluation of  at the request of Lorraine Bowen, MD.  Ms. Pasqual developed acute vision loss in her right eye 04/2019.  She saw her ophthalmologist who saw a central retinal artery occlusion.  She was sent to the emergency department to have a stroke work-up.  CT of the head and neck were unremarkable.  She was offered admission for inpatient work-up but elected to have this completed as an outpatient.  She was started on aspirin and completed over 3 weeks with plans to switch to just aspirin.  She was also started on atorvastatin at that time.  General she has felt well.  She walks for exercise daily and does aerobics 3 days/week.  When she finishes she feels somewhat tired, especially in her legs.  She has no exertional chest pain or shortness of breath.  She has no lower extremity edema, orthopnea, or PND.  She gets her blood pressure checked at least weekly at friend's home and it is always less than 130/80.  She notes that she is nervous about her appointment today.  A week and a half ago she feels like she lost more vision in her eye.   Past Medical History:  Diagnosis Date  . Arthritis    oa  . Cancer ([place])    basal cell skin cancer, multiple areas removed, thryoid  . Central retinal artery occlusion 07/07/2019  . DJD (degenerative joint disease)   . Essential hypertension 07/07/2019  . GERD (gastroesophageal reflux disease)    takes prilosec   . Hypertension   . Hypothyroidism since 2001  . Osteoporosis   . Polymyalgia rheumatica ([place])   . Pure hypercholesterolemia 07/07/2019  . Sleep apnea    uses  mouthpiece somnodent, pt wishes to leave mouthpiece at home, pt told moderate severe sleep apnea  . Thyroid disease   . Vaginal delivery 1958, [id number]    Past Surgical History:  Procedure Laterality Date  . ANTERIOR AND POSTERIOR REPAIR N/A 11/02/2014   Procedure: ANTERIOR (CYSTOCELE) Repair AND Possible POSTERIOR REPAIR (RECTOCELE);  Surgeon: Lorraine Bruins, MD;  Location: [facility];  Service: Gynecology;  Laterality: N/A;  . CYSTOSCOPY N/A 11/02/2014   Procedure: CYSTOSCOPY;  Surgeon: Lorraine Bruins, MD;  Location: [facility];  Service: Gynecology;  Laterality: N/A;  . DILATION AND CURETTAGE OF UTERUS    . EYE SURGERY  2007   both eyes lens replacements for cataracts  . LAMINECTOMY    . ROBOTIC ASSISTED LAPAROSCOPIC SACROCOLPOPEXY N/A 11/02/2014   Procedure: ROBOTIC ASSISTED LAPAROSCOPIC Uterosacral ligament suspension for SACROCOLPOPEXY;  Surgeon: Lorraine Bruins, MD;  Location: [facility];  Service: Gynecology;  Laterality: N/A;  . ROBOTIC ASSISTED TOTAL HYSTERECTOMY WITH BILATERAL SALPINGO OOPHERECTOMY Bilateral 11/02/2014   Procedure: ROBOTIC ASSISTED TOTAL HYSTERECTOMY WITH BILATERAL SALPINGO OOPHORECTOMY;  Surgeon: Lorraine Bruins, MD;  Location: [facility];  Service: Gynecology;  Laterality: Bilateral;  . THYROIDECTOMY  2001   and radiation done also, partial  . TONSILLECTOMY    . TOTAL KNEE ARTHROPLASTY Right 04/25/2014   Procedure: RIGHT TOTAL KNEE ARTHROPLASTY;  Surgeon: Lorraine Alf, MD;  Location:  [facility];  Service: Orthopedics;  Laterality: Right;  . TUBAL LIGATION       Current Outpatient Medications  Medication Sig Dispense Refill  . aspirin EC 81 MG tablet Take 81 mg by mouth daily.    Marland Kitchen CALCIUM-VITAMIN D PO Take 1 tablet by mouth daily.    . cholecalciferol (VITAMIN D3) 25 MCG (1000 UT) tablet Take 1,000 Units by mouth daily.    Marland Kitchen diltiazem (CARDIZEM CD) 300 MG 24 hr capsule Take 300 mg by mouth every morning.     . escitalopram (LEXAPRO) 5 MG tablet Take 5 mg by mouth  daily.    Marland Kitchen levothyroxine (SYNTHROID, LEVOTHROID) 125 MCG tablet Take 125 mcg by mouth every morning. On an empty stomach.    . losartan (COZAAR) 100 MG tablet Take 100 mg by mouth daily.    . Multiple Vitamin (MULTIVITAMIN WITH MINERALS) TABS tablet Take 1 tablet by mouth daily.    . Omega-3 Fatty Acids (FISH OIL PO) Take 1 capsule by mouth daily.    Marland Kitchen omeprazole (PRILOSEC) 20 MG capsule Take 20 mg by mouth every morning.     . rosuvastatin (CRESTOR) 20 MG tablet Take 1 tablet (20 mg total) by mouth daily. 90 tablet 3   No current facility-administered medications for this visit.    Allergies:   Oxycodone, Celebrex [celecoxib], and Latex    Social History:  The patient  reports that she has never smoked. She has never used smokeless tobacco. She reports current alcohol use of about 1.0 standard drinks of alcohol per week. She reports that she does not use drugs.   Family History:  The patient's family history includes Breast cancer in her daughter.    ROS:  Please see the history of present illness.   Otherwise, review of systems are positive for none.   All other systems are reviewed and negative.    PHYSICAL EXAM: VS:  BP (!) 163/83   Pulse 79   Ht 5\' 2"  (1.575 m)   Wt 130 lb (59 kg)   SpO2 95%   BMI 23.78 kg/m  , BMI Body mass index is 23.78 kg/m. GENERAL:  Well appearing HEENT:  Pupils equal round and reactive, fundi not visualized, oral mucosa unremarkable NECK:  No jugular venous distention, waveform within normal limits, carotid upstroke brisk and symmetric, no bruits, no thyromegaly LYMPHATICS:  No cervical adenopathy LUNGS:  Clear to auscultation bilaterally HEART:  RRR.  PMI not displaced or sustained,S1 and S2 within normal limits, no S3, no S4, no clicks, no rubs, no murmurs ABD:  Flat, positive bowel sounds normal in frequency in pitch, no bruits, no rebound, no guarding, no midline pulsatile mass, no hepatomegaly, no splenomegaly EXT:  2 plus pulses throughout,  trace edema, no cyanosis no clubbing SKIN:  No rashes no nodules NEURO:  Cranial nerves II through XII grossly intact, motor grossly intact throughout PSYCH:  Cognitively intact, oriented to person place and time    EKG:  EKG is not ordered today. The ekg ordered 04/18/2019 demonstrates sinus rhythm.  Rate 66 bpm.  LVH.   Recent Labs: 04/18/2019: ALT 15; BUN 16; Creatinine, Ser 0.74; Hemoglobin 15.3; Platelets 368; Potassium 4.2; Sodium 135    Lipid Panel No results found for: CHOL, TRIG, HDL, CHOLHDL, VLDL, LDLCALC, LDLDIRECT    Wt Readings from Last 3 Encounters:  07/07/19 130 lb (59 kg)  11/01/17 124 lb (56.2 kg)  07/10/17 124 lb (56.2 kg)      ASSESSMENT AND PLAN:  #  Central retinal artery occlusion: # hyperlipidemia;  There is no evidence of carotid stenosis on CT of the neck.  Therefore we will proceed with TEE to rule out cardiac emboli and PFO/ASD.  If this is negative we will proceed with having a loop recorder implanted.  She is agreeable to this plan.  Continue aspirin for now.  She has not been taking atorvastatin.  She requested to take simvastatin because her husband takes this.  However given that she is on diltiazem, we would prefer to do rosuvastatin 20 mg daily.  Repeat lipids and a CMP in a couple months.  # Hypertension:   Blood pressure is elevated here but she is very certain that it is well have been controlled at home.  She is quite anxious about this appointment.  I have asked her to keep checking it and her goal should be less than 130/80.  Continue diltiazem and losartan.   Current medicines are reviewed at length with the patient today.  The patient does not have concerns regarding medicines.  The following changes have been made:  Start rosuvastatin  Labs/ tests ordered today include:   Orders Placed This Encounter  Procedures  . CBC with Differential/Platelet  . Basic metabolic panel  . Lipid panel  . Comprehensive metabolic panel      Disposition:   FU with Samani Deal C. Oval Linsey, MD, [facility] in 6 months.      Signed, Morena Mckissack C. Oval Linsey, MD, [facility]  07/07/2019 1:28 PM    [place]

## 2019-07-07 NOTE — Progress Notes (Signed)
Cardiology Office Note   Date:  07/07/2019   ID:  Lorraine Brandt, Lorraine Brandt 09/17/1933, MRN [id number]  PCP:  Lorraine Bowen, MD  Cardiologist:   Skeet Latch, MD   No chief complaint on file.    History of Present Illness: Lorraine Brandt is a 86 y.o. female with hypertension, thyroid adenocarcinoma s/p resection, CRAO of the R eye who is being seen today for the evaluation of  at the request of Lorraine Bowen, MD.  [name] developed acute vision loss in her right eye 04/2019.  She saw her ophthalmologist who saw a central retinal artery occlusion.  She was sent to the emergency department to have a stroke work-up.  CT of the head and neck were unremarkable.  She was offered admission for inpatient work-up but elected to have this completed as an outpatient.  She was started on aspirin and completed over 3 weeks with plans to switch to just aspirin.  She was also started on atorvastatin at that time.  General she has felt well.  She walks for exercise daily and does aerobics 3 days/week.  When she finishes she feels somewhat tired, especially in her legs.  She has no exertional chest pain or shortness of breath.  She has no lower extremity edema, orthopnea, or PND.  She gets her blood pressure checked at least weekly at friend's home and it is always less than 130/80.  She notes that she is nervous about her appointment today.  A week and a half ago she feels like she lost more vision in her eye.   Past Medical History:  Diagnosis Date  . Arthritis    oa  . Cancer ([place])    basal cell skin cancer, multiple areas removed, thryoid  . Central retinal artery occlusion 07/07/2019  . DJD (degenerative joint disease)   . Essential hypertension 07/07/2019  . GERD (gastroesophageal reflux disease)    takes prilosec   . Hypertension   . Hypothyroidism since 2001  . Osteoporosis   . Polymyalgia rheumatica ([place])   . Pure hypercholesterolemia 07/07/2019  . Sleep apnea    uses  mouthpiece somnodent, pt wishes to leave mouthpiece at home, pt told moderate severe sleep apnea  . Thyroid disease   . Vaginal delivery 1958, [id number]    Past Surgical History:  Procedure Laterality Date  . ANTERIOR AND POSTERIOR REPAIR N/A 11/02/2014   Procedure: ANTERIOR (CYSTOCELE) Repair AND Possible POSTERIOR REPAIR (RECTOCELE);  Surgeon: Princess Bruins, MD;  Location: [facility];  Service: Gynecology;  Laterality: N/A;  . CYSTOSCOPY N/A 11/02/2014   Procedure: CYSTOSCOPY;  Surgeon: Princess Bruins, MD;  Location: [facility];  Service: Gynecology;  Laterality: N/A;  . DILATION AND CURETTAGE OF UTERUS    . EYE SURGERY  2007   both eyes lens replacements for cataracts  . LAMINECTOMY    . ROBOTIC ASSISTED LAPAROSCOPIC SACROCOLPOPEXY N/A 11/02/2014   Procedure: ROBOTIC ASSISTED LAPAROSCOPIC Uterosacral ligament suspension for SACROCOLPOPEXY;  Surgeon: Princess Bruins, MD;  Location: [facility];  Service: Gynecology;  Laterality: N/A;  . ROBOTIC ASSISTED TOTAL HYSTERECTOMY WITH BILATERAL SALPINGO OOPHERECTOMY Bilateral 11/02/2014   Procedure: ROBOTIC ASSISTED TOTAL HYSTERECTOMY WITH BILATERAL SALPINGO OOPHORECTOMY;  Surgeon: Princess Bruins, MD;  Location: [facility];  Service: Gynecology;  Laterality: Bilateral;  . THYROIDECTOMY  2001   and radiation done also, partial  . TONSILLECTOMY    . TOTAL KNEE ARTHROPLASTY Right 04/25/2014   Procedure: RIGHT TOTAL KNEE ARTHROPLASTY;  Surgeon: Gearlean Alf, MD;  Location:  [facility];  Service: Orthopedics;  Laterality: Right;  . TUBAL LIGATION       Current Outpatient Medications  Medication Sig Dispense Refill  . aspirin EC 81 MG tablet Take 81 mg by mouth daily.    Marland Kitchen CALCIUM-VITAMIN D PO Take 1 tablet by mouth daily.    . cholecalciferol (VITAMIN D3) 25 MCG (1000 UT) tablet Take 1,000 Units by mouth daily.    Marland Kitchen diltiazem (CARDIZEM CD) 300 MG 24 hr capsule Take 300 mg by mouth every morning.     . escitalopram (LEXAPRO) 5 MG tablet Take 5 mg by mouth  daily.    Marland Kitchen levothyroxine (SYNTHROID, LEVOTHROID) 125 MCG tablet Take 125 mcg by mouth every morning. On an empty stomach.    . losartan (COZAAR) 100 MG tablet Take 100 mg by mouth daily.    . Multiple Vitamin (MULTIVITAMIN WITH MINERALS) TABS tablet Take 1 tablet by mouth daily.    . Omega-3 Fatty Acids (FISH OIL PO) Take 1 capsule by mouth daily.    Marland Kitchen omeprazole (PRILOSEC) 20 MG capsule Take 20 mg by mouth every morning.     . rosuvastatin (CRESTOR) 20 MG tablet Take 1 tablet (20 mg total) by mouth daily. 90 tablet 3   No current facility-administered medications for this visit.    Allergies:   Oxycodone, Celebrex [celecoxib], and Latex    Social History:  The patient  reports that she has never smoked. She has never used smokeless tobacco. She reports current alcohol use of about 1.0 standard drinks of alcohol per week. She reports that she does not use drugs.   Family History:  The patient's family history includes Breast cancer in her daughter.    ROS:  Please see the history of present illness.   Otherwise, review of systems are positive for none.   All other systems are reviewed and negative.    PHYSICAL EXAM: VS:  BP (!) 163/83   Pulse 79   Ht 5\' 2"  (1.575 m)   Wt 130 lb (59 kg)   SpO2 95%   BMI 23.78 kg/m  , BMI Body mass index is 23.78 kg/m. GENERAL:  Well appearing HEENT:  Pupils equal round and reactive, fundi not visualized, oral mucosa unremarkable NECK:  No jugular venous distention, waveform within normal limits, carotid upstroke brisk and symmetric, no bruits, no thyromegaly LYMPHATICS:  No cervical adenopathy LUNGS:  Clear to auscultation bilaterally HEART:  RRR.  PMI not displaced or sustained,S1 and S2 within normal limits, no S3, no S4, no clicks, no rubs, no murmurs ABD:  Flat, positive bowel sounds normal in frequency in pitch, no bruits, no rebound, no guarding, no midline pulsatile mass, no hepatomegaly, no splenomegaly EXT:  2 plus pulses throughout,  trace edema, no cyanosis no clubbing SKIN:  No rashes no nodules NEURO:  Cranial nerves II through XII grossly intact, motor grossly intact throughout PSYCH:  Cognitively intact, oriented to person place and time    EKG:  EKG is not ordered today. The ekg ordered 04/18/2019 demonstrates sinus rhythm.  Rate 66 bpm.  LVH.   Recent Labs: 04/18/2019: ALT 15; BUN 16; Creatinine, Ser 0.74; Hemoglobin 15.3; Platelets 368; Potassium 4.2; Sodium 135    Lipid Panel No results found for: CHOL, TRIG, HDL, CHOLHDL, VLDL, LDLCALC, LDLDIRECT    Wt Readings from Last 3 Encounters:  07/07/19 130 lb (59 kg)  11/01/17 124 lb (56.2 kg)  07/10/17 124 lb (56.2 kg)      ASSESSMENT AND PLAN:  #  Central retinal artery occlusion: # hyperlipidemia;  There is no evidence of carotid stenosis on CT of the neck.  Therefore we will proceed with TEE to rule out cardiac emboli and PFO/ASD.  If this is negative we will proceed with having a loop recorder implanted.  She is agreeable to this plan.  Continue aspirin for now.  She has not been taking atorvastatin.  She requested to take simvastatin because her husband takes this.  However given that she is on diltiazem, we would prefer to do rosuvastatin 20 mg daily.  Repeat lipids and a CMP in a couple months.  # Hypertension:   Blood pressure is elevated here but she is very certain that it is well have been controlled at home.  She is quite anxious about this appointment.  I have asked her to keep checking it and her goal should be less than 130/80.  Continue diltiazem and losartan.   Current medicines are reviewed at length with the patient today.  The patient does not have concerns regarding medicines.  The following changes have been made:  Start rosuvastatin  Labs/ tests ordered today include:   Orders Placed This Encounter  Procedures  . CBC with Differential/Platelet  . Basic metabolic panel  . Lipid panel  . Comprehensive metabolic panel      Disposition:   FU with Derrico Zhong C. Oval Linsey, MD, [facility] in 6 months.      Signed, Cassian Torelli C. Oval Linsey, MD, [facility]  07/07/2019 1:28 PM    [place]

## 2019-07-11 ENCOUNTER — Other Ambulatory Visit (HOSPITAL_COMMUNITY)
Admission: RE | Admit: 2019-07-11 | Discharge: 2019-07-11 | Disposition: A | Discharge status: Home / Self Care | Payer: Medicare Other | Source: Ambulatory Visit | Attending: Internal Medicine | Admitting: Internal Medicine

## 2019-07-11 DIAGNOSIS — H3411 Central retinal artery occlusion, right eye: Secondary | ICD-10-CM | POA: Diagnosis not present

## 2019-07-11 DIAGNOSIS — Z01812 Encounter for preprocedural laboratory examination: Secondary | ICD-10-CM | POA: Diagnosis not present

## 2019-07-11 DIAGNOSIS — U071 COVID-19: Secondary | ICD-10-CM | POA: Insufficient documentation

## 2019-07-11 LAB — BASIC METABOLIC PANEL
BUN/Creatinine Ratio: 19 (ref 12–28)
BUN: 15 mg/dL (ref 8–27)
CO2: 23 mmol/L (ref 20–29)
Calcium: 9.5 mg/dL (ref 8.7–10.3)
Chloride: 96 mmol/L (ref 96–106)
Creatinine, Ser: 0.8 mg/dL (ref 0.57–1.00)
GFR calc Af Amer: 79 mL/min/{1.73_m2} (ref >59)
GFR calc non Af Amer: 68 mL/min/{1.73_m2} (ref >59)
Glucose: 89 mg/dL (ref 65–99)
Potassium: 4.6 mmol/L (ref 3.5–5.2)
Sodium: 132 mmol/L — ABNORMAL LOW (ref 134–144)

## 2019-07-11 LAB — CBC WITH DIFFERENTIAL/PLATELET
Basophils Absolute: 0 10*3/uL (ref 0.0–0.2)
Basos: 1 %
EOS (ABSOLUTE): 0.3 10*3/uL (ref 0.0–0.4)
Eos: 3 %
Hematocrit: 41.2 % (ref 34.0–46.6)
Hemoglobin: 14.3 g/dL (ref 11.1–15.9)
Immature Grans (Abs): 0 10*3/uL (ref 0.0–0.1)
Immature Granulocytes: 0 %
Lymphocytes Absolute: 1.7 10*3/uL (ref 0.7–3.1)
Lymphs: 20 %
MCH: 31.8 pg (ref 26.6–33.0)
MCHC: 34.7 g/dL (ref 31.5–35.7)
MCV: 92 fL (ref 79–97)
Monocytes Absolute: 0.6 10*3/uL (ref 0.1–0.9)
Monocytes: 7 %
Neutrophils Absolute: 5.7 10*3/uL (ref 1.4–7.0)
Neutrophils: 69 %
Platelets: 335 10*3/uL (ref 150–450)
RBC: 4.5 x10E6/uL (ref 3.77–5.28)
RDW: 12.9 % (ref 11.7–15.4)
WBC: 8.3 10*3/uL (ref 3.4–10.8)

## 2019-07-11 LAB — SARS CORONAVIRUS 2 (TAT 6-24 HRS): SARS Coronavirus 2: POSITIVE — AB

## 2019-07-12 ENCOUNTER — Telehealth (HOSPITAL_COMMUNITY): Payer: Self-pay | Admitting: Nurse Practitioner

## 2019-07-12 ENCOUNTER — Telehealth: Payer: Self-pay | Admitting: *Deleted

## 2019-07-12 DIAGNOSIS — Z01812 Encounter for preprocedural laboratory examination: Secondary | ICD-10-CM

## 2019-07-12 DIAGNOSIS — I10 Essential (primary) hypertension: Secondary | ICD-10-CM

## 2019-07-12 NOTE — Telephone Encounter (Signed)
Called to discuss with Lorraine Brandt about Covid symptoms and the use of bamlanivimab, a monoclonal antibody infusion for those with mild to moderate Covid symptoms and at a high risk of hospitalization.    Pt does not qualify for infusion therapy as she has asymptomatic infection. Isolation precautions discussed. Advised to contact back for consideration should she develop symptoms. Patient verbalized understanding.   Patient had testing done as pre-operative clearance. She developed cough and congestion approximately 3-4 months ago that has persisted since that time. No other symptoms in interim.   Patient Active Problem List   Diagnosis Date Noted  . Central retinal artery occlusion 07/07/2019  . Essential hypertension 07/07/2019  . Pure hypercholesterolemia 07/07/2019  . Postoperative state 11/02/2014  . OA (osteoarthritis) of knee 04/25/2014    Beckey Rutter, DNP, AGNP-C [facility]  [email or web address] [phone] ([place])

## 2019-07-12 NOTE — Progress Notes (Signed)
Thanks [facility].  She will quarantine and it will be rescheduled.  She is well clinically.

## 2019-07-12 NOTE — Telephone Encounter (Signed)
Patient had positive COVID screening test. Spoke with patient and she has not been around anyone with COVID that she is aware of. She has had an allergy type cough for the last few months but attributed to allergies, denies any other symptoms. Patient will quarantine and will reschedule for 2 weeks post test per Dr Oval Linsey.   Rescheduled and left message for patient to call back

## 2019-07-13 ENCOUNTER — Ambulatory Visit: Payer: Medicare Other

## 2019-07-13 ENCOUNTER — Telehealth: Payer: Self-pay | Admitting: Internal Medicine

## 2019-07-13 NOTE — Telephone Encounter (Signed)
Lilia Pro a nurse at [facility] is calling to inform Dr. Rayann Heman that the patient has tested positive for Covid.

## 2019-07-14 ENCOUNTER — Ambulatory Visit: Payer: Medicare Other | Admitting: Neurology

## 2019-07-14 ENCOUNTER — Ambulatory Visit (HOSPITAL_COMMUNITY): Admission: RE | Admit: 2019-07-14 | Payer: Medicare Other | Source: Home / Self Care | Admitting: Internal Medicine

## 2019-07-14 ENCOUNTER — Encounter (HOSPITAL_COMMUNITY): Admission: RE | Payer: Self-pay | Source: Home / Self Care

## 2019-07-14 SURGERY — ECHOCARDIOGRAM, TRANSESOPHAGEAL
Anesthesia: Moderate Sedation

## 2019-07-14 SURGERY — LOOP RECORDER INSERTION

## 2019-07-22 ENCOUNTER — Telehealth: Payer: Self-pay

## 2019-07-22 ENCOUNTER — Encounter: Payer: Self-pay | Admitting: *Deleted

## 2019-07-22 NOTE — Telephone Encounter (Signed)
Patient has been rescheduled and mychart message sent to patient

## 2019-07-25 ENCOUNTER — Other Ambulatory Visit: Payer: Self-pay

## 2019-07-25 ENCOUNTER — Telehealth: Payer: Self-pay

## 2019-07-25 ENCOUNTER — Telehealth: Payer: Self-pay | Admitting: Internal Medicine

## 2019-07-25 ENCOUNTER — Telehealth (INDEPENDENT_AMBULATORY_CARE_PROVIDER_SITE_OTHER): Payer: Medicare Other | Admitting: Internal Medicine

## 2019-07-25 ENCOUNTER — Encounter: Payer: Self-pay | Admitting: *Deleted

## 2019-07-25 ENCOUNTER — Encounter: Payer: Self-pay | Admitting: Internal Medicine

## 2019-07-25 ENCOUNTER — Telehealth: Payer: Self-pay | Admitting: Cardiovascular Disease

## 2019-07-25 VITALS — BP 130/80 | Ht 62.0 in | Wt 129.0 lb

## 2019-07-25 DIAGNOSIS — Z01812 Encounter for preprocedural laboratory examination: Secondary | ICD-10-CM | POA: Diagnosis not present

## 2019-07-25 DIAGNOSIS — G459 Transient cerebral ischemic attack, unspecified: Secondary | ICD-10-CM

## 2019-07-25 DIAGNOSIS — H3411 Central retinal artery occlusion, right eye: Secondary | ICD-10-CM

## 2019-07-25 DIAGNOSIS — H35033 Hypertensive retinopathy, bilateral: Secondary | ICD-10-CM | POA: Diagnosis not present

## 2019-07-25 DIAGNOSIS — I10 Essential (primary) hypertension: Secondary | ICD-10-CM | POA: Diagnosis not present

## 2019-07-25 DIAGNOSIS — H35363 Drusen (degenerative) of macula, bilateral: Secondary | ICD-10-CM | POA: Diagnosis not present

## 2019-07-25 DIAGNOSIS — H3582 Retinal ischemia: Secondary | ICD-10-CM | POA: Diagnosis not present

## 2019-07-25 DIAGNOSIS — H34811 Central retinal vein occlusion, right eye, with macular edema: Secondary | ICD-10-CM | POA: Diagnosis not present

## 2019-07-25 DIAGNOSIS — H34211 Partial retinal artery occlusion, right eye: Secondary | ICD-10-CM | POA: Diagnosis not present

## 2019-07-25 LAB — CBC WITH DIFFERENTIAL/PLATELET
Basophils Absolute: 0 10*3/uL (ref 0.0–0.2)
Basos: 0 %
EOS (ABSOLUTE): 0.2 10*3/uL (ref 0.0–0.4)
Eos: 2 %
Hematocrit: 43 % (ref 34.0–46.6)
Hemoglobin: 14.7 g/dL (ref 11.1–15.9)
Immature Grans (Abs): 0 10*3/uL (ref 0.0–0.1)
Immature Granulocytes: 0 %
Lymphocytes Absolute: 1.2 10*3/uL (ref 0.7–3.1)
Lymphs: 13 %
MCH: 31.3 pg (ref 26.6–33.0)
MCHC: 34.2 g/dL (ref 31.5–35.7)
MCV: 92 fL (ref 79–97)
Monocytes Absolute: 0.6 10*3/uL (ref 0.1–0.9)
Monocytes: 6 %
Neutrophils Absolute: 7.6 10*3/uL — ABNORMAL HIGH (ref 1.4–7.0)
Neutrophils: 79 %
Platelets: 385 10*3/uL (ref 150–450)
RBC: 4.7 x10E6/uL (ref 3.77–5.28)
RDW: 13.2 % (ref 11.7–15.4)
WBC: 9.7 10*3/uL (ref 3.4–10.8)

## 2019-07-25 LAB — BASIC METABOLIC PANEL
BUN/Creatinine Ratio: 17 (ref 12–28)
BUN: 11 mg/dL (ref 8–27)
CO2: 24 mmol/L (ref 20–29)
Calcium: 9.3 mg/dL (ref 8.7–10.3)
Chloride: 95 mmol/L — ABNORMAL LOW (ref 96–106)
Creatinine, Ser: 0.66 mg/dL (ref 0.57–1.00)
GFR calc Af Amer: 94 mL/min/{1.73_m2} (ref >59)
GFR calc non Af Amer: 82 mL/min/{1.73_m2} (ref >59)
Glucose: 64 mg/dL — ABNORMAL LOW (ref 65–99)
Potassium: 4.8 mmol/L (ref 3.5–5.2)
Sodium: 134 mmol/L (ref 134–144)

## 2019-07-25 NOTE — Progress Notes (Signed)
Patient ID: Lorraine Brandt, female   DOB: 10/07/1933, 86 y.o.   MRN: [id number] Patient enrolled for [employer] to ship a 30 day Cardiac Event Monitor to her home.  Instructions sent to patient via My Chart Message and will also be included in her monitor kit.

## 2019-07-25 NOTE — Telephone Encounter (Signed)
Called by ophthalmology about Lorraine Brandt. There are concerns for new retinal occlusive thrombus. She will proceed with TEE loop recorder 07/28/2019. We will order carotid ultrasounds and have her follow-up with her cardiologist in the next 1-2 weeks.   [place] T. Audie Box, [place]  [address], [place], [place] [address] [phone]  3:10 PM

## 2019-07-25 NOTE — Telephone Encounter (Signed)
Discussed with Pt.  See other phone note.

## 2019-07-25 NOTE — Telephone Encounter (Signed)
Patient came to [facility] location for lab work and states she had a My Chart video visit with Dr. Rayann Heman this morning and they discussed the patient having a monitor.  She would like to have the "internal" monitor instead of the external.

## 2019-07-25 NOTE — Telephone Encounter (Signed)
Spoke with Pt.  Advised per Dr. Rayann Heman to start off with her evaluation by wearing the 30 day monitor.  Advised if nothing found on that monitor then we would recommend a loop recorder.  Pt indicates understanding and is happy with POC.  Will mail HM to Pt.  Need to update secondary insurance:  [employer] # [id number]   Needs 6-7 week f/u with Chanetta Marshall, NP>

## 2019-07-25 NOTE — Telephone Encounter (Signed)
-----   Message from Thompson Grayer, MD sent at 07/25/2019  9:09 AM EDT ----- Cancel loop recorder implant on Thursday but keep appointment for TEE She will need 30 day monitor ordered for stroke indication  Follow-up with Chanetta Marshall in 6-7 weeks

## 2019-07-25 NOTE — Progress Notes (Signed)
Electrophysiology TeleHealth Note   Due to national recommendations of social distancing due to [place] 19, Audio/video telehealth visit is felt to be most appropriate for this patient at this time.  See MyChart message from today for patient consent regarding telehealth for [facility].   Date:  07/25/2019   ID:  Lorraine Brandt, DOB Jan 06, 1934, MRN [id number]  Location: home  Provider location: [place] Evaluation Performed: New patient consult  PCP:  Reynold Bowen, MD  Cardiologist:  Dr Oval Linsey Electrophysiologist:  None   Chief Complaint:  Retinal arterial occlusion  History of Present Illness:    Lorraine Brandt is a 86 y.o. female who presents via audio/video conferencing for a telehealth visit today.   The patient is referred for new consultation regarding evaluation of a cardioembolic source for her arterial occlusion by Dr Oval Linsey. The patient was found by her retina specialist to have branch retinal artery occlusion 04/2019.  She was sent to [facility] ED and was evaluated by neurology (Dr Arrie Eastern notes reviewed).  Imaging revealed no substantial abnormality other than small vessel disease.  30 day monitor was advised and she was started on plavix x 3 weeks.  She was also advised to see Dr Oval Linsey for outpatient cardiology evaluation (her note also reviewed). She has not yet had her 30 day monitor.  She denies symptoms of arrhythmia. She states "Dr Forde Dandy said that the implanted monitor was too expensive and that I should just proceed with a 30 day monitor".  She has TEE scheduled for later this week.  Today, she denies symptoms of palpitations, chest pain, shortness of breath, dizziness, presyncope, syncope, bleeding, or neurologic sequela. The patient is tolerating medications without difficulties and is otherwise without complaint today.    Past Medical History:  Diagnosis Date  . Arthritis    oa  . Cancer ([place])    basal cell skin cancer, multiple areas  removed, thryoid  . Central retinal artery occlusion 07/07/2019  . DJD (degenerative joint disease)   . Essential hypertension 07/07/2019  . GERD (gastroesophageal reflux disease)    takes prilosec   . Hypertension   . Hypothyroidism since 2001  . Osteoporosis   . Polymyalgia rheumatica ([place])   . Pure hypercholesterolemia 07/07/2019  . Sleep apnea    uses mouthpiece somnodent, pt wishes to leave mouthpiece at home, pt told moderate severe sleep apnea  . Thyroid disease   . Vaginal delivery 1958, [id number]    Past Surgical History:  Procedure Laterality Date  . ANTERIOR AND POSTERIOR REPAIR N/A 11/02/2014   Procedure: ANTERIOR (CYSTOCELE) Repair AND Possible POSTERIOR REPAIR (RECTOCELE);  Surgeon: Princess Bruins, MD;  Location: [facility];  Service: Gynecology;  Laterality: N/A;  . CYSTOSCOPY N/A 11/02/2014   Procedure: CYSTOSCOPY;  Surgeon: Princess Bruins, MD;  Location: [facility];  Service: Gynecology;  Laterality: N/A;  . DILATION AND CURETTAGE OF UTERUS    . EYE SURGERY  2007   both eyes lens replacements for cataracts  . LAMINECTOMY    . ROBOTIC ASSISTED LAPAROSCOPIC SACROCOLPOPEXY N/A 11/02/2014   Procedure: ROBOTIC ASSISTED LAPAROSCOPIC Uterosacral ligament suspension for SACROCOLPOPEXY;  Surgeon: Princess Bruins, MD;  Location: [facility];  Service: Gynecology;  Laterality: N/A;  . ROBOTIC ASSISTED TOTAL HYSTERECTOMY WITH BILATERAL SALPINGO OOPHERECTOMY Bilateral 11/02/2014   Procedure: ROBOTIC ASSISTED TOTAL HYSTERECTOMY WITH BILATERAL SALPINGO OOPHORECTOMY;  Surgeon: Princess Bruins, MD;  Location: [facility];  Service: Gynecology;  Laterality: Bilateral;  . THYROIDECTOMY  2001   and radiation done also,  partial  . TONSILLECTOMY    . TOTAL KNEE ARTHROPLASTY Right 04/25/2014   Procedure: RIGHT TOTAL KNEE ARTHROPLASTY;  Surgeon: Gearlean Alf, MD;  Location: [facility];  Service: Orthopedics;  Laterality: Right;  . TUBAL LIGATION      Current Outpatient Medications  Medication Sig  Dispense Refill  . acetaminophen (TYLENOL) 500 MG tablet Take 500 mg by mouth every 6 (six) hours as needed for moderate pain or headache.    . Ascorbic Acid (VITAMIN C) 500 MG CAPS Take 1 tablet by mouth daily.    Marland Kitchen aspirin EC 81 MG tablet Take 81 mg by mouth daily.    Marland Kitchen CALCIUM-VITAMIN D PO Take 1 tablet by mouth in the morning and at bedtime.     . Carboxymethylcellul-Glycerin (LUBRICATING EYE DROPS OP) Place 1 drop into both eyes daily as needed (dry eyes).    . cholecalciferol (VITAMIN D3) 25 MCG (1000 UT) tablet Take 1,000 Units by mouth daily.    Marland Kitchen diltiazem (CARDIZEM CD) 300 MG 24 hr capsule Take 300 mg by mouth every morning.     . docusate sodium (COLACE) 100 MG capsule Take 100 mg by mouth daily as needed for mild constipation.    Marland Kitchen escitalopram (LEXAPRO) 5 MG tablet Take 5 mg by mouth daily.    Marland Kitchen levothyroxine (SYNTHROID, LEVOTHROID) 125 MCG tablet Take 125 mcg by mouth every Monday, Tuesday, Wednesday, Thursday, and Friday. On an empty stomach.    . losartan (COZAAR) 100 MG tablet Take 100 mg by mouth daily.    . Omega-3 Fatty Acids (FISH OIL PO) Take 1,500 mg by mouth daily.    Marland Kitchen omeprazole (PRILOSEC) 20 MG capsule Take 20 mg by mouth every morning.     . rosuvastatin (CRESTOR) 20 MG tablet Take 1 tablet (20 mg total) by mouth daily. 90 tablet 3   No current facility-administered medications for this visit.    Allergies:   Oxycodone, Celebrex [celecoxib], and Latex   Social History:  The patient  reports that she has never smoked. She has never used smokeless tobacco. She reports current alcohol use of about 1.0 standard drinks of alcohol per week. She reports that she does not use drugs.   Family History:  The patient's family history includes Breast cancer in her daughter.    ROS:  Please see the history of present illness.   All other systems are personally reviewed and negative.    Exam:    Vital Signs:  BP 130/80   Ht 5\' 2"  (1.575 m)   Wt 129 lb (58.5 kg)   BMI  23.59 kg/m     Well appearing, alert and conversant, regular work of breathing,  good skin color Eyes- anicteric, neuro- grossly intact, skin- no apparent rash or lesions or cyanosis, mouth- oral mucosa is pink   Labs/Other Tests and Data Reviewed:    Recent Labs: 04/18/2019: ALT 15 07/11/2019: BUN 15; Creatinine, Ser 0.80; Hemoglobin 14.3; Platelets 335; Potassium 4.6; Sodium 132   Wt Readings from Last 3 Encounters:  07/25/19 129 lb (58.5 kg)  07/07/19 130 lb (59 kg)  11/01/17 124 lb (56.2 kg)     Other studies personally reviewed: Additional studies/ records that were reviewed today include: ED notes,  Dr Quintella Reichert notes,  Dr Aroor's notes  Review of the above records today demonstrates: as above Prior radiographs:  CTA and brain MRI also reviewed  ASSESSMENT & PLAN:    1.  Retinal artery occlusion I agree with Dr Oval Linsey  that it is prudent to evaluate for cardioembolic source. She has a TEE scheduled for later this week. She prefers to have 30 day monitor placed rather than loop recorder.  I think that this is very reasonable. We will therefore send her a 30 day monitor and then have her follow-up with EP APP for consideration of ILR depending on its findings.  2. HTN Stable No change required today    Current medicines are reviewed at length with the patient today.   The patient does not have concerns regarding her medicines.  The following changes were made today:  none  Labs/ tests ordered today include:  No orders of the defined types were placed in this encounter.   Patient Risk:  after full review of this patients clinical status, I feel that they are at moderate risk at this time.   Today, I have spent 20 minutes with the patient with telehealth technology discussing central retinal artery occlusion .    Signed, Thompson Grayer MD, [place] 07/25/2019 8:52 AM   [facility] [address] [place] [address] [phone]  (office) [phone] (fax)

## 2019-07-28 ENCOUNTER — Encounter (HOSPITAL_COMMUNITY): Payer: Self-pay | Admitting: Internal Medicine

## 2019-07-28 ENCOUNTER — Ambulatory Visit (HOSPITAL_COMMUNITY): Payer: Medicare Other | Admitting: Certified Registered"

## 2019-07-28 ENCOUNTER — Ambulatory Visit (HOSPITAL_BASED_OUTPATIENT_CLINIC_OR_DEPARTMENT_OTHER): Payer: Medicare Other

## 2019-07-28 ENCOUNTER — Encounter (HOSPITAL_COMMUNITY)
Admission: RE | Disposition: A | Discharge status: Home / Self Care | Payer: Medicare Other | Source: Home / Self Care | Attending: Internal Medicine

## 2019-07-28 ENCOUNTER — Other Ambulatory Visit (HOSPITAL_COMMUNITY): Payer: Self-pay | Admitting: Internal Medicine

## 2019-07-28 ENCOUNTER — Other Ambulatory Visit: Payer: Self-pay

## 2019-07-28 ENCOUNTER — Ambulatory Visit (HOSPITAL_COMMUNITY): Payer: Medicare Other

## 2019-07-28 ENCOUNTER — Ambulatory Visit (HOSPITAL_COMMUNITY)
Admission: RE | Admit: 2019-07-28 | Discharge: 2019-07-28 | Disposition: A | Discharge status: Home / Self Care | Payer: Medicare Other | Attending: Internal Medicine | Admitting: Internal Medicine

## 2019-07-28 DIAGNOSIS — M1990 Unspecified osteoarthritis, unspecified site: Secondary | ICD-10-CM | POA: Diagnosis not present

## 2019-07-28 DIAGNOSIS — K219 Gastro-esophageal reflux disease without esophagitis: Secondary | ICD-10-CM | POA: Insufficient documentation

## 2019-07-28 DIAGNOSIS — E7800 Pure hypercholesterolemia, unspecified: Secondary | ICD-10-CM | POA: Diagnosis not present

## 2019-07-28 DIAGNOSIS — Z7982 Long term (current) use of aspirin: Secondary | ICD-10-CM | POA: Diagnosis not present

## 2019-07-28 DIAGNOSIS — Z886 Allergy status to analgesic agent status: Secondary | ICD-10-CM | POA: Insufficient documentation

## 2019-07-28 DIAGNOSIS — M353 Polymyalgia rheumatica: Secondary | ICD-10-CM | POA: Insufficient documentation

## 2019-07-28 DIAGNOSIS — Z96651 Presence of right artificial knee joint: Secondary | ICD-10-CM | POA: Insufficient documentation

## 2019-07-28 DIAGNOSIS — Z885 Allergy status to narcotic agent status: Secondary | ICD-10-CM | POA: Insufficient documentation

## 2019-07-28 DIAGNOSIS — Z79899 Other long term (current) drug therapy: Secondary | ICD-10-CM | POA: Diagnosis not present

## 2019-07-28 DIAGNOSIS — G4730 Sleep apnea, unspecified: Secondary | ICD-10-CM | POA: Diagnosis not present

## 2019-07-28 DIAGNOSIS — Z91040 Latex allergy status: Secondary | ICD-10-CM | POA: Insufficient documentation

## 2019-07-28 DIAGNOSIS — E039 Hypothyroidism, unspecified: Secondary | ICD-10-CM | POA: Insufficient documentation

## 2019-07-28 DIAGNOSIS — I639 Cerebral infarction, unspecified: Secondary | ICD-10-CM

## 2019-07-28 DIAGNOSIS — M810 Age-related osteoporosis without current pathological fracture: Secondary | ICD-10-CM | POA: Insufficient documentation

## 2019-07-28 DIAGNOSIS — I080 Rheumatic disorders of both mitral and aortic valves: Secondary | ICD-10-CM | POA: Diagnosis not present

## 2019-07-28 DIAGNOSIS — H349 Unspecified retinal vascular occlusion: Secondary | ICD-10-CM

## 2019-07-28 DIAGNOSIS — H3411 Central retinal artery occlusion, right eye: Secondary | ICD-10-CM | POA: Insufficient documentation

## 2019-07-28 DIAGNOSIS — I340 Nonrheumatic mitral (valve) insufficiency: Secondary | ICD-10-CM | POA: Diagnosis not present

## 2019-07-28 DIAGNOSIS — Z79890 Hormone replacement therapy (postmenopausal): Secondary | ICD-10-CM | POA: Insufficient documentation

## 2019-07-28 HISTORY — PX: BUBBLE STUDY: SHX6837

## 2019-07-28 LAB — ECHOCARDIOGRAM COMPLETE
Height: 62 in
Weight: 2064 oz

## 2019-07-28 SURGERY — INVASIVE LAB ABORTED CASE
Anesthesia: Monitor Anesthesia Care

## 2019-07-28 MED ORDER — SODIUM CHLORIDE 0.9 % IV SOLN: INTRAVENOUS | Status: DC

## 2019-07-28 MED ORDER — PROPOFOL 500 MG/50ML IV EMUL
INTRAVENOUS | Status: DC | PRN
  Administered 2019-07-28: 100 ug/kg/min via INTRAVENOUS

## 2019-07-28 MED ORDER — LIDOCAINE 2% (20 MG/ML) 5 ML SYRINGE
INTRAMUSCULAR | Status: DC | PRN
  Administered 2019-07-28: 60 mg via INTRAVENOUS

## 2019-07-28 MED ORDER — PROPOFOL 10 MG/ML IV BOLUS
INTRAVENOUS | Status: DC | PRN
  Administered 2019-07-28: 20 mg via INTRAVENOUS
  Administered 2019-07-28: 10 mg via INTRAVENOUS

## 2019-07-28 NOTE — Anesthesia Preprocedure Evaluation (Addendum)
Anesthesia Evaluation  Patient identified by MRN, date of birth, ID band Patient awake    Reviewed: Allergy & Precautions, NPO status , Patient's Chart, lab work & pertinent test results  Airway Mallampati: II  TM Distance: >3 FB Neck ROM: Full    Dental no notable dental hx. (+) Teeth Intact, Dental Advisory Given   Pulmonary sleep apnea ,    Pulmonary exam normal breath sounds clear to auscultation       Cardiovascular hypertension, Pt. on medications negative cardio ROS Normal cardiovascular exam Rhythm:Regular Rate:Normal  TTE 07/2019 - Left ventricle: The cavity size was normal. Wall thickness was increased in a pattern of mild LVH. There was focal basal hypertrophy. Systolic function was normal. The estimated ejection fraction was in the range of 60% to 65%. Wall motion was normal; there were no regional wall motion abnormalities.  - Mitral valve: There was mild regurgitation.  - Pulmonary arteries: Systolic pressure was mildly increased. PA peak pressure: 36 mm Hg (S).    Neuro/Psych Depression TIAnegative psych ROS   GI/Hepatic Neg liver ROS, GERD  ,  Endo/Other  Hypothyroidism   Renal/GU negative Renal ROS  negative genitourinary   Musculoskeletal  (+) Arthritis ,   Abdominal   Peds  Hematology negative hematology ROS (+)   Anesthesia Other Findings Central retinal artery occulsion right eye  Reproductive/Obstetrics                            Anesthesia Physical Anesthesia Plan  ASA: III  Anesthesia Plan: MAC   Post-op Pain Management:    Induction: Intravenous  PONV Risk Score and Plan: 2 and Propofol infusion and Treatment may vary due to age or medical condition  Airway Management Planned: Natural Airway  Additional Equipment:   Intra-op Plan:   Post-operative Plan:   Informed Consent: I have reviewed the patients History and Physical, chart, labs and discussed  the procedure including the risks, benefits and alternatives for the proposed anesthesia with the patient or authorized representative who has indicated his/her understanding and acceptance.     Dental advisory given  Plan Discussed with: CRNA  Anesthesia Plan Comments:         Anesthesia Quick Evaluation

## 2019-07-28 NOTE — Discharge Instructions (Signed)

## 2019-07-28 NOTE — Progress Notes (Signed)
  Echocardiogram Echocardiogram Transesophageal was attempted.  Lorraine Brandt 07/28/2019, 10:38 AM

## 2019-07-28 NOTE — CV Procedure (Addendum)
INDICATIONS: Retinal artery occlusion  PROCEDURE:   Informed consent was obtained prior to the procedure. The risks, benefits and alternatives for the procedure were discussed and the patient comprehended these risks.  Risks include, but are not limited to, cough, sore throat, vomiting, nausea, somnolence, esophageal and stomach trauma or perforation, bleeding, low blood pressure, aspiration, pneumonia, infection, trauma to the teeth and death.    Procedural time out performed. Patient's posterior oropharynx was not anesthetized.   During this procedure the patient was administered propofol for monitored deep sedation.  The patient's heart rate, blood pressure, and oxygen saturation were monitored continuously during the procedure. The period of conscious sedation was 15 minutes, of which I was present face-to-face 100% of this time.  The transesophageal probe was unable to be inserted past the base of the tongue due to bronchospasm.  The patient was kept under observation until the patient left the procedure room.  The patient left the procedure room in stable condition.   Agitated microbubble saline contrast was administered with transthoracic imaging.   COMPLICATIONS:    Patient experienced desaturations and bronchospasm with minimal probe insertion to base of tongue.   FINDINGS:  Transthoracic images obtained for focused bubble study prior to TEE procedure. No definite interatrial shunt with agitated saline and Valsalva release.   RECOMMENDATIONS:     Will obtain TTE prior to patient's dismissal from hospital to evaluate for source of embolism.  Time Spent Directly with the Patient:  60 minutes   [name] 07/28/2019, 10:24 AM

## 2019-07-28 NOTE — Progress Notes (Signed)
  Echocardiogram 2D Echocardiogram has been performed.  Lorraine Brandt G [name] 07/28/2019, 11:54 AM

## 2019-07-28 NOTE — Progress Notes (Signed)
Patient here for her TEE,  Discussed loop implant with her.  Discussed the note that her eye doctor had thought she had further retinal clot/event, she was aware.   Discussed discussion in her chart to proceed with loop implant Revisited rational for this and if having AFib stroke risk. She was well aware of this and reports her doctors have discussed and explained all of this to her well.  She remains most comfortable proceeding with wearable event monitor 1st and following up in the office.   CRNA had though maybe her tele looked like AFib, though there was much movement and when the patient held still was SR with some PACs.   In review of her tele, I see motion artifact, nothing that looks like AFib to me She was going to get a confirmatory EKG prior to proceeding with TEE   Tommye Standard, PA-C

## 2019-07-28 NOTE — Interval H&P Note (Signed)
History and Physical Interval Note:  07/28/2019 8:58 AM  Lorraine Brandt  has presented today for surgery, with the diagnosis of [place].  The various methods of treatment have been discussed with the patient and family. After consideration of risks, benefits and other options for treatment, the patient has consented to  Procedure(s): TRANSESOPHAGEAL ECHOCARDIOGRAM (TEE) (N/A) as a surgical intervention.  The patient's history has been reviewed, patient examined, no change in status, stable for surgery.  I have reviewed the patient's chart and labs.  Questions were answered to the patient's satisfaction.     [name]

## 2019-07-28 NOTE — Transfer of Care (Signed)
Immediate Anesthesia Transfer of Care Note  Patient: Lorraine Brandt  Procedure(s) Performed: Aborted TEE Transthoracic Echocardiogram Bubble Study  Patient Location: Endoscopy Unit  Anesthesia Type:MAC  Level of Consciousness: awake, alert  and oriented  Airway & Oxygen Therapy: Patient Spontanous Breathing  Post-op Assessment: Report given to RN  Post vital signs: Reviewed and stable  Last Vitals:  Vitals Value Taken Time  BP 163/71 07/28/19 1020  Temp 36.6 C 07/28/19 1020  Pulse 81 07/28/19 1021  Resp 15 07/28/19 1021  SpO2 97 % 07/28/19 1021  Vitals shown include unvalidated device data.  Last Pain:  Vitals:   07/28/19 1020  TempSrc: Oral  PainSc:          Complications: No apparent anesthesia complications

## 2019-07-28 NOTE — Progress Notes (Signed)
Procedure aborted per Dr. Margaretann Loveless and Dr. Lanetta Inch due to bronchospasm.

## 2019-07-28 NOTE — Progress Notes (Signed)
Anesthesia personnel for day has requested clarification regarding positive COVID test 07/11/2018 and possible signs or symptoms of illness.  Call placed to patient prior to TEE scheduled for today at 0900. Patient was tested for pre-procedure purposes on 07/11/2019 she was not ill at that time. Patient denies any signs and symptoms of illness before test or following. She had both doses of moderna vaccine on 05/14/2019 and 06/13/2019. Information relayed to anesthesia personnel.

## 2019-07-29 ENCOUNTER — Encounter: Payer: Self-pay | Admitting: Internal Medicine

## 2019-08-01 NOTE — Anesthesia Postprocedure Evaluation (Signed)
Anesthesia Post Note  Patient: Lorraine Brandt  Procedure(s) Performed: Aborted TEE BUBBLE STUDY     Patient location during evaluation: Endoscopy Anesthesia Type: MAC Level of consciousness: awake and alert Pain management: pain level controlled Vital Signs Assessment: post-procedure vital signs reviewed and stable Respiratory status: spontaneous breathing, nonlabored ventilation, respiratory function stable and patient connected to nasal cannula oxygen Cardiovascular status: blood pressure returned to baseline and stable Postop Assessment: no apparent nausea or vomiting Anesthetic complications: no Comments: Procedure aborted 2/2 significant laryngospasm as soon as scope was placed in oropharynx. Laryngospasm despite adequate depth of anesthesia. Mild desaturations noted. All other vital signs stable. Laryngospasm broke with positive pressure ventilation. Observed patient in procedure room until alert and oriented. Discharge in stable condition. Breathing without difficulty. Maintaining oxygen saturations on room air.     Last Vitals:  Vitals:   07/28/19 1130 07/28/19 1140  BP: (!) 152/79   Pulse: 71 75  Resp: 12 20  Temp:    SpO2: 96% 97%    Last Pain:  Vitals:   07/29/19 1126  TempSrc:   PainSc: 0-No pain                 Vali Capano L Standley Bargo

## 2019-08-02 DIAGNOSIS — H35363 Drusen (degenerative) of macula, bilateral: Secondary | ICD-10-CM | POA: Diagnosis not present

## 2019-08-02 DIAGNOSIS — H34211 Partial retinal artery occlusion, right eye: Secondary | ICD-10-CM | POA: Diagnosis not present

## 2019-08-02 DIAGNOSIS — H34811 Central retinal vein occlusion, right eye, with macular edema: Secondary | ICD-10-CM | POA: Diagnosis not present

## 2019-08-02 DIAGNOSIS — H35033 Hypertensive retinopathy, bilateral: Secondary | ICD-10-CM | POA: Diagnosis not present

## 2019-08-03 ENCOUNTER — Ambulatory Visit (INDEPENDENT_AMBULATORY_CARE_PROVIDER_SITE_OTHER): Payer: Medicare Other

## 2019-08-03 DIAGNOSIS — H3411 Central retinal artery occlusion, right eye: Secondary | ICD-10-CM

## 2019-08-03 DIAGNOSIS — I4891 Unspecified atrial fibrillation: Secondary | ICD-10-CM

## 2019-08-05 ENCOUNTER — Telehealth: Payer: Self-pay | Admitting: *Deleted

## 2019-08-05 ENCOUNTER — Encounter: Payer: Self-pay | Admitting: *Deleted

## 2019-08-05 NOTE — Telephone Encounter (Signed)
Carotid duplex had been ordered by DOD  Patient had CT in December as below   RIGHT CAROTID SYSTEM: Normal without aneurysm, dissection or stenosis.  LEFT CAROTID SYSTEM: Normal without aneurysm, dissection or stenosis.  Per Dr Oval Linsey no carotid duplex needed at this time. Called patient, vm. Mychart message sent to patient

## 2019-08-09 DIAGNOSIS — M545 Low back pain: Secondary | ICD-10-CM | POA: Diagnosis not present

## 2019-08-09 DIAGNOSIS — M5417 Radiculopathy, lumbosacral region: Secondary | ICD-10-CM | POA: Diagnosis not present

## 2019-08-11 ENCOUNTER — Inpatient Hospital Stay (HOSPITAL_COMMUNITY): Admission: RE | Admit: 2019-08-11 | Payer: Medicare Other | Source: Ambulatory Visit

## 2019-08-29 ENCOUNTER — Ambulatory Visit
Admission: RE | Admit: 2019-08-29 | Discharge: 2019-08-29 | Disposition: A | Discharge status: Home / Self Care | Payer: Medicare Other | Source: Ambulatory Visit | Attending: Endocrinology | Admitting: Endocrinology

## 2019-08-29 ENCOUNTER — Other Ambulatory Visit: Payer: Self-pay

## 2019-08-29 DIAGNOSIS — M810 Age-related osteoporosis without current pathological fracture: Secondary | ICD-10-CM | POA: Diagnosis not present

## 2019-08-29 DIAGNOSIS — Z780 Asymptomatic menopausal state: Secondary | ICD-10-CM | POA: Diagnosis not present

## 2019-08-29 DIAGNOSIS — Z1231 Encounter for screening mammogram for malignant neoplasm of breast: Secondary | ICD-10-CM

## 2019-08-30 DIAGNOSIS — H00012 Hordeolum externum right lower eyelid: Secondary | ICD-10-CM | POA: Diagnosis not present

## 2019-09-05 DIAGNOSIS — M75120 Complete rotator cuff tear or rupture of unspecified shoulder, not specified as traumatic: Secondary | ICD-10-CM | POA: Diagnosis not present

## 2019-09-05 DIAGNOSIS — M25511 Pain in right shoulder: Secondary | ICD-10-CM | POA: Diagnosis not present

## 2019-09-07 DIAGNOSIS — C73 Malignant neoplasm of thyroid gland: Secondary | ICD-10-CM | POA: Diagnosis not present

## 2019-09-07 DIAGNOSIS — E890 Postprocedural hypothyroidism: Secondary | ICD-10-CM | POA: Diagnosis not present

## 2019-09-07 DIAGNOSIS — M353 Polymyalgia rheumatica: Secondary | ICD-10-CM | POA: Diagnosis not present

## 2019-09-07 DIAGNOSIS — H3411 Central retinal artery occlusion, right eye: Secondary | ICD-10-CM | POA: Diagnosis not present

## 2019-09-07 DIAGNOSIS — E871 Hypo-osmolality and hyponatremia: Secondary | ICD-10-CM | POA: Diagnosis not present

## 2019-09-07 DIAGNOSIS — G4733 Obstructive sleep apnea (adult) (pediatric): Secondary | ICD-10-CM | POA: Diagnosis not present

## 2019-09-07 DIAGNOSIS — E559 Vitamin D deficiency, unspecified: Secondary | ICD-10-CM | POA: Diagnosis not present

## 2019-09-07 DIAGNOSIS — M810 Age-related osteoporosis without current pathological fracture: Secondary | ICD-10-CM | POA: Diagnosis not present

## 2019-09-07 DIAGNOSIS — I5189 Other ill-defined heart diseases: Secondary | ICD-10-CM | POA: Diagnosis not present

## 2019-09-12 DIAGNOSIS — H34811 Central retinal vein occlusion, right eye, with macular edema: Secondary | ICD-10-CM | POA: Diagnosis not present

## 2019-09-12 DIAGNOSIS — H35033 Hypertensive retinopathy, bilateral: Secondary | ICD-10-CM | POA: Diagnosis not present

## 2019-09-12 DIAGNOSIS — H35363 Drusen (degenerative) of macula, bilateral: Secondary | ICD-10-CM | POA: Diagnosis not present

## 2019-09-12 DIAGNOSIS — H34211 Partial retinal artery occlusion, right eye: Secondary | ICD-10-CM | POA: Diagnosis not present

## 2019-09-14 ENCOUNTER — Other Ambulatory Visit: Payer: Self-pay | Admitting: Internal Medicine

## 2019-09-14 DIAGNOSIS — I4891 Unspecified atrial fibrillation: Secondary | ICD-10-CM

## 2019-09-14 DIAGNOSIS — H3411 Central retinal artery occlusion, right eye: Secondary | ICD-10-CM

## 2019-09-15 ENCOUNTER — Ambulatory Visit: Payer: Medicare Other | Admitting: Student

## 2019-09-16 ENCOUNTER — Ambulatory Visit: Payer: Medicare Other | Admitting: Student

## 2019-09-29 ENCOUNTER — Encounter: Payer: Self-pay | Admitting: *Deleted

## 2019-12-15 ENCOUNTER — Ambulatory Visit: Payer: Medicare Other | Admitting: Cardiovascular Disease

## 2020-01-02 DIAGNOSIS — H34211 Partial retinal artery occlusion, right eye: Secondary | ICD-10-CM | POA: Diagnosis not present

## 2020-01-02 DIAGNOSIS — H35373 Puckering of macula, bilateral: Secondary | ICD-10-CM | POA: Diagnosis not present

## 2020-01-02 DIAGNOSIS — H353112 Nonexudative age-related macular degeneration, right eye, intermediate dry stage: Secondary | ICD-10-CM | POA: Diagnosis not present

## 2020-01-02 DIAGNOSIS — H34811 Central retinal vein occlusion, right eye, with macular edema: Secondary | ICD-10-CM | POA: Diagnosis not present

## 2020-01-12 DIAGNOSIS — D225 Melanocytic nevi of trunk: Secondary | ICD-10-CM | POA: Diagnosis not present

## 2020-01-12 DIAGNOSIS — L821 Other seborrheic keratosis: Secondary | ICD-10-CM | POA: Diagnosis not present

## 2020-01-12 DIAGNOSIS — L723 Sebaceous cyst: Secondary | ICD-10-CM | POA: Diagnosis not present

## 2020-01-12 DIAGNOSIS — L819 Disorder of pigmentation, unspecified: Secondary | ICD-10-CM | POA: Diagnosis not present

## 2020-01-12 DIAGNOSIS — D3617 Benign neoplasm of peripheral nerves and autonomic nervous system of trunk, unspecified: Secondary | ICD-10-CM | POA: Diagnosis not present

## 2020-01-12 DIAGNOSIS — D1801 Hemangioma of skin and subcutaneous tissue: Secondary | ICD-10-CM | POA: Diagnosis not present

## 2020-01-12 DIAGNOSIS — L814 Other melanin hyperpigmentation: Secondary | ICD-10-CM | POA: Diagnosis not present

## 2020-01-12 DIAGNOSIS — L570 Actinic keratosis: Secondary | ICD-10-CM | POA: Diagnosis not present

## 2020-01-12 DIAGNOSIS — C44311 Basal cell carcinoma of skin of nose: Secondary | ICD-10-CM | POA: Diagnosis not present

## 2020-01-12 DIAGNOSIS — D692 Other nonthrombocytopenic purpura: Secondary | ICD-10-CM | POA: Diagnosis not present

## 2020-01-12 DIAGNOSIS — Z85828 Personal history of other malignant neoplasm of skin: Secondary | ICD-10-CM | POA: Diagnosis not present

## 2020-01-12 DIAGNOSIS — L817 Pigmented purpuric dermatosis: Secondary | ICD-10-CM | POA: Diagnosis not present

## 2020-01-18 DIAGNOSIS — M75121 Complete rotator cuff tear or rupture of right shoulder, not specified as traumatic: Secondary | ICD-10-CM | POA: Diagnosis not present

## 2020-01-18 DIAGNOSIS — M19011 Primary osteoarthritis, right shoulder: Secondary | ICD-10-CM | POA: Diagnosis not present

## 2020-01-27 DIAGNOSIS — R2681 Unsteadiness on feet: Secondary | ICD-10-CM | POA: Diagnosis not present

## 2020-01-27 DIAGNOSIS — Z9181 History of falling: Secondary | ICD-10-CM | POA: Diagnosis not present

## 2020-01-27 DIAGNOSIS — M25511 Pain in right shoulder: Secondary | ICD-10-CM | POA: Diagnosis not present

## 2020-01-27 DIAGNOSIS — M19011 Primary osteoarthritis, right shoulder: Secondary | ICD-10-CM | POA: Diagnosis not present

## 2020-01-30 DIAGNOSIS — M25511 Pain in right shoulder: Secondary | ICD-10-CM | POA: Diagnosis not present

## 2020-01-30 DIAGNOSIS — Z9181 History of falling: Secondary | ICD-10-CM | POA: Diagnosis not present

## 2020-01-30 DIAGNOSIS — R2681 Unsteadiness on feet: Secondary | ICD-10-CM | POA: Diagnosis not present

## 2020-01-30 DIAGNOSIS — M19011 Primary osteoarthritis, right shoulder: Secondary | ICD-10-CM | POA: Diagnosis not present

## 2020-01-31 DIAGNOSIS — Z23 Encounter for immunization: Secondary | ICD-10-CM | POA: Diagnosis not present

## 2020-02-02 DIAGNOSIS — Z9181 History of falling: Secondary | ICD-10-CM | POA: Diagnosis not present

## 2020-02-02 DIAGNOSIS — M25511 Pain in right shoulder: Secondary | ICD-10-CM | POA: Diagnosis not present

## 2020-02-02 DIAGNOSIS — R2681 Unsteadiness on feet: Secondary | ICD-10-CM | POA: Diagnosis not present

## 2020-02-02 DIAGNOSIS — M19011 Primary osteoarthritis, right shoulder: Secondary | ICD-10-CM | POA: Diagnosis not present

## 2020-02-06 DIAGNOSIS — M25511 Pain in right shoulder: Secondary | ICD-10-CM | POA: Diagnosis not present

## 2020-02-06 DIAGNOSIS — Z9181 History of falling: Secondary | ICD-10-CM | POA: Diagnosis not present

## 2020-02-06 DIAGNOSIS — M19011 Primary osteoarthritis, right shoulder: Secondary | ICD-10-CM | POA: Diagnosis not present

## 2020-02-06 DIAGNOSIS — R2681 Unsteadiness on feet: Secondary | ICD-10-CM | POA: Diagnosis not present

## 2020-02-07 DIAGNOSIS — C44319 Basal cell carcinoma of skin of other parts of face: Secondary | ICD-10-CM | POA: Diagnosis not present

## 2020-02-07 DIAGNOSIS — Z85828 Personal history of other malignant neoplasm of skin: Secondary | ICD-10-CM | POA: Diagnosis not present

## 2020-02-13 DIAGNOSIS — M19011 Primary osteoarthritis, right shoulder: Secondary | ICD-10-CM | POA: Diagnosis not present

## 2020-02-13 DIAGNOSIS — H349 Unspecified retinal vascular occlusion: Secondary | ICD-10-CM | POA: Diagnosis not present

## 2020-02-13 DIAGNOSIS — M25511 Pain in right shoulder: Secondary | ICD-10-CM | POA: Diagnosis not present

## 2020-02-13 DIAGNOSIS — Z961 Presence of intraocular lens: Secondary | ICD-10-CM | POA: Diagnosis not present

## 2020-02-13 DIAGNOSIS — H348112 Central retinal vein occlusion, right eye, stable: Secondary | ICD-10-CM | POA: Diagnosis not present

## 2020-02-13 DIAGNOSIS — R2681 Unsteadiness on feet: Secondary | ICD-10-CM | POA: Diagnosis not present

## 2020-02-13 DIAGNOSIS — Z9181 History of falling: Secondary | ICD-10-CM | POA: Diagnosis not present

## 2020-02-13 DIAGNOSIS — H524 Presbyopia: Secondary | ICD-10-CM | POA: Diagnosis not present

## 2020-02-16 DIAGNOSIS — M19011 Primary osteoarthritis, right shoulder: Secondary | ICD-10-CM | POA: Diagnosis not present

## 2020-02-16 DIAGNOSIS — R2681 Unsteadiness on feet: Secondary | ICD-10-CM | POA: Diagnosis not present

## 2020-02-16 DIAGNOSIS — Z9181 History of falling: Secondary | ICD-10-CM | POA: Diagnosis not present

## 2020-02-16 DIAGNOSIS — M25511 Pain in right shoulder: Secondary | ICD-10-CM | POA: Diagnosis not present

## 2020-02-20 DIAGNOSIS — M25511 Pain in right shoulder: Secondary | ICD-10-CM | POA: Diagnosis not present

## 2020-02-20 DIAGNOSIS — Z9181 History of falling: Secondary | ICD-10-CM | POA: Diagnosis not present

## 2020-02-20 DIAGNOSIS — R2681 Unsteadiness on feet: Secondary | ICD-10-CM | POA: Diagnosis not present

## 2020-02-20 DIAGNOSIS — M19011 Primary osteoarthritis, right shoulder: Secondary | ICD-10-CM | POA: Diagnosis not present

## 2020-02-22 DIAGNOSIS — Z9181 History of falling: Secondary | ICD-10-CM | POA: Diagnosis not present

## 2020-02-22 DIAGNOSIS — M19011 Primary osteoarthritis, right shoulder: Secondary | ICD-10-CM | POA: Diagnosis not present

## 2020-02-22 DIAGNOSIS — R2681 Unsteadiness on feet: Secondary | ICD-10-CM | POA: Diagnosis not present

## 2020-02-22 DIAGNOSIS — M25511 Pain in right shoulder: Secondary | ICD-10-CM | POA: Diagnosis not present

## 2020-02-28 DIAGNOSIS — H35373 Puckering of macula, bilateral: Secondary | ICD-10-CM | POA: Diagnosis not present

## 2020-02-28 DIAGNOSIS — H34811 Central retinal vein occlusion, right eye, with macular edema: Secondary | ICD-10-CM | POA: Diagnosis not present

## 2020-02-28 DIAGNOSIS — H353132 Nonexudative age-related macular degeneration, bilateral, intermediate dry stage: Secondary | ICD-10-CM | POA: Diagnosis not present

## 2020-02-28 DIAGNOSIS — H34211 Partial retinal artery occlusion, right eye: Secondary | ICD-10-CM | POA: Diagnosis not present

## 2020-03-09 DIAGNOSIS — I700 Atherosclerosis of aorta: Secondary | ICD-10-CM | POA: Diagnosis not present

## 2020-03-09 DIAGNOSIS — E559 Vitamin D deficiency, unspecified: Secondary | ICD-10-CM | POA: Diagnosis not present

## 2020-03-09 DIAGNOSIS — E890 Postprocedural hypothyroidism: Secondary | ICD-10-CM | POA: Diagnosis not present

## 2020-03-09 DIAGNOSIS — H3411 Central retinal artery occlusion, right eye: Secondary | ICD-10-CM | POA: Diagnosis not present

## 2020-03-09 DIAGNOSIS — G4700 Insomnia, unspecified: Secondary | ICD-10-CM | POA: Diagnosis not present

## 2020-03-09 DIAGNOSIS — M810 Age-related osteoporosis without current pathological fracture: Secondary | ICD-10-CM | POA: Diagnosis not present

## 2020-03-09 DIAGNOSIS — D649 Anemia, unspecified: Secondary | ICD-10-CM | POA: Diagnosis not present

## 2020-03-09 DIAGNOSIS — C73 Malignant neoplasm of thyroid gland: Secondary | ICD-10-CM | POA: Diagnosis not present

## 2020-03-09 DIAGNOSIS — G4733 Obstructive sleep apnea (adult) (pediatric): Secondary | ICD-10-CM | POA: Diagnosis not present

## 2020-03-09 DIAGNOSIS — I5189 Other ill-defined heart diseases: Secondary | ICD-10-CM | POA: Diagnosis not present

## 2020-03-09 DIAGNOSIS — I679 Cerebrovascular disease, unspecified: Secondary | ICD-10-CM | POA: Diagnosis not present

## 2020-03-09 DIAGNOSIS — I6523 Occlusion and stenosis of bilateral carotid arteries: Secondary | ICD-10-CM | POA: Diagnosis not present

## 2020-03-26 DIAGNOSIS — Z23 Encounter for immunization: Secondary | ICD-10-CM | POA: Diagnosis not present

## 2020-04-02 DIAGNOSIS — H34211 Partial retinal artery occlusion, right eye: Secondary | ICD-10-CM | POA: Diagnosis not present

## 2020-04-02 DIAGNOSIS — H35033 Hypertensive retinopathy, bilateral: Secondary | ICD-10-CM | POA: Diagnosis not present

## 2020-04-02 DIAGNOSIS — H34811 Central retinal vein occlusion, right eye, with macular edema: Secondary | ICD-10-CM | POA: Diagnosis not present

## 2020-04-02 DIAGNOSIS — H353132 Nonexudative age-related macular degeneration, bilateral, intermediate dry stage: Secondary | ICD-10-CM | POA: Diagnosis not present

## 2020-04-24 DIAGNOSIS — H34211 Partial retinal artery occlusion, right eye: Secondary | ICD-10-CM | POA: Diagnosis not present

## 2020-04-24 DIAGNOSIS — H34811 Central retinal vein occlusion, right eye, with macular edema: Secondary | ICD-10-CM | POA: Diagnosis not present

## 2020-04-24 DIAGNOSIS — H35373 Puckering of macula, bilateral: Secondary | ICD-10-CM | POA: Diagnosis not present

## 2020-04-24 DIAGNOSIS — H353132 Nonexudative age-related macular degeneration, bilateral, intermediate dry stage: Secondary | ICD-10-CM | POA: Diagnosis not present

## 2020-06-18 DIAGNOSIS — H35033 Hypertensive retinopathy, bilateral: Secondary | ICD-10-CM | POA: Diagnosis not present

## 2020-06-18 DIAGNOSIS — H353132 Nonexudative age-related macular degeneration, bilateral, intermediate dry stage: Secondary | ICD-10-CM | POA: Diagnosis not present

## 2020-06-18 DIAGNOSIS — H34811 Central retinal vein occlusion, right eye, with macular edema: Secondary | ICD-10-CM | POA: Diagnosis not present

## 2020-06-18 DIAGNOSIS — H34211 Partial retinal artery occlusion, right eye: Secondary | ICD-10-CM | POA: Diagnosis not present

## 2020-06-21 DIAGNOSIS — M25552 Pain in left hip: Secondary | ICD-10-CM | POA: Diagnosis not present

## 2020-07-31 DIAGNOSIS — H903 Sensorineural hearing loss, bilateral: Secondary | ICD-10-CM | POA: Diagnosis not present

## 2020-07-31 DIAGNOSIS — E039 Hypothyroidism, unspecified: Secondary | ICD-10-CM | POA: Diagnosis not present

## 2020-08-03 ENCOUNTER — Other Ambulatory Visit: Payer: Self-pay | Admitting: Endocrinology

## 2020-08-03 DIAGNOSIS — Z1231 Encounter for screening mammogram for malignant neoplasm of breast: Secondary | ICD-10-CM

## 2020-08-13 DIAGNOSIS — H34211 Partial retinal artery occlusion, right eye: Secondary | ICD-10-CM | POA: Diagnosis not present

## 2020-08-13 DIAGNOSIS — H353132 Nonexudative age-related macular degeneration, bilateral, intermediate dry stage: Secondary | ICD-10-CM | POA: Diagnosis not present

## 2020-08-13 DIAGNOSIS — H35373 Puckering of macula, bilateral: Secondary | ICD-10-CM | POA: Diagnosis not present

## 2020-08-13 DIAGNOSIS — H34811 Central retinal vein occlusion, right eye, with macular edema: Secondary | ICD-10-CM | POA: Diagnosis not present

## 2020-09-06 DIAGNOSIS — M25511 Pain in right shoulder: Secondary | ICD-10-CM | POA: Diagnosis not present

## 2020-09-11 DIAGNOSIS — I10 Essential (primary) hypertension: Secondary | ICD-10-CM | POA: Diagnosis not present

## 2020-09-11 DIAGNOSIS — S41111A Laceration without foreign body of right upper arm, initial encounter: Secondary | ICD-10-CM | POA: Diagnosis not present

## 2020-09-11 DIAGNOSIS — S51811A Laceration without foreign body of right forearm, initial encounter: Secondary | ICD-10-CM | POA: Diagnosis not present

## 2020-09-11 DIAGNOSIS — Z79899 Other long term (current) drug therapy: Secondary | ICD-10-CM | POA: Diagnosis not present

## 2020-09-11 DIAGNOSIS — S51801A Unspecified open wound of right forearm, initial encounter: Secondary | ICD-10-CM | POA: Diagnosis not present

## 2020-09-11 DIAGNOSIS — E7800 Pure hypercholesterolemia, unspecified: Secondary | ICD-10-CM | POA: Diagnosis not present

## 2020-09-19 DIAGNOSIS — H3411 Central retinal artery occlusion, right eye: Secondary | ICD-10-CM | POA: Diagnosis not present

## 2020-09-19 DIAGNOSIS — D649 Anemia, unspecified: Secondary | ICD-10-CM | POA: Diagnosis not present

## 2020-09-19 DIAGNOSIS — C73 Malignant neoplasm of thyroid gland: Secondary | ICD-10-CM | POA: Diagnosis not present

## 2020-09-19 DIAGNOSIS — I700 Atherosclerosis of aorta: Secondary | ICD-10-CM | POA: Diagnosis not present

## 2020-09-19 DIAGNOSIS — E890 Postprocedural hypothyroidism: Secondary | ICD-10-CM | POA: Diagnosis not present

## 2020-09-19 DIAGNOSIS — M810 Age-related osteoporosis without current pathological fracture: Secondary | ICD-10-CM | POA: Diagnosis not present

## 2020-09-19 DIAGNOSIS — G4733 Obstructive sleep apnea (adult) (pediatric): Secondary | ICD-10-CM | POA: Diagnosis not present

## 2020-09-19 DIAGNOSIS — I639 Cerebral infarction, unspecified: Secondary | ICD-10-CM | POA: Diagnosis not present

## 2020-09-19 DIAGNOSIS — I679 Cerebrovascular disease, unspecified: Secondary | ICD-10-CM | POA: Diagnosis not present

## 2020-09-19 DIAGNOSIS — I6523 Occlusion and stenosis of bilateral carotid arteries: Secondary | ICD-10-CM | POA: Diagnosis not present

## 2020-09-19 DIAGNOSIS — I5189 Other ill-defined heart diseases: Secondary | ICD-10-CM | POA: Diagnosis not present

## 2020-09-19 DIAGNOSIS — G459 Transient cerebral ischemic attack, unspecified: Secondary | ICD-10-CM | POA: Diagnosis not present

## 2020-09-26 ENCOUNTER — Other Ambulatory Visit: Payer: Self-pay

## 2020-09-26 ENCOUNTER — Ambulatory Visit
Admission: RE | Admit: 2020-09-26 | Discharge: 2020-09-26 | Disposition: A | Discharge status: Home / Self Care | Payer: Medicare Other | Source: Ambulatory Visit | Attending: Endocrinology | Admitting: Endocrinology

## 2020-09-26 DIAGNOSIS — Z1231 Encounter for screening mammogram for malignant neoplasm of breast: Secondary | ICD-10-CM

## 2020-10-09 DIAGNOSIS — H35373 Puckering of macula, bilateral: Secondary | ICD-10-CM | POA: Diagnosis not present

## 2020-10-09 DIAGNOSIS — H353132 Nonexudative age-related macular degeneration, bilateral, intermediate dry stage: Secondary | ICD-10-CM | POA: Diagnosis not present

## 2020-10-09 DIAGNOSIS — H34211 Partial retinal artery occlusion, right eye: Secondary | ICD-10-CM | POA: Diagnosis not present

## 2020-10-09 DIAGNOSIS — H34811 Central retinal vein occlusion, right eye, with macular edema: Secondary | ICD-10-CM | POA: Diagnosis not present

## 2020-10-17 DIAGNOSIS — Z23 Encounter for immunization: Secondary | ICD-10-CM | POA: Diagnosis not present

## 2020-12-04 DIAGNOSIS — H353132 Nonexudative age-related macular degeneration, bilateral, intermediate dry stage: Secondary | ICD-10-CM | POA: Diagnosis not present

## 2020-12-04 DIAGNOSIS — H34211 Partial retinal artery occlusion, right eye: Secondary | ICD-10-CM | POA: Diagnosis not present

## 2020-12-04 DIAGNOSIS — H35373 Puckering of macula, bilateral: Secondary | ICD-10-CM | POA: Diagnosis not present

## 2020-12-04 DIAGNOSIS — H34811 Central retinal vein occlusion, right eye, with macular edema: Secondary | ICD-10-CM | POA: Diagnosis not present

## 2021-01-29 DIAGNOSIS — H34811 Central retinal vein occlusion, right eye, with macular edema: Secondary | ICD-10-CM | POA: Diagnosis not present

## 2021-01-29 DIAGNOSIS — H34211 Partial retinal artery occlusion, right eye: Secondary | ICD-10-CM | POA: Diagnosis not present

## 2021-01-29 DIAGNOSIS — H353132 Nonexudative age-related macular degeneration, bilateral, intermediate dry stage: Secondary | ICD-10-CM | POA: Diagnosis not present

## 2021-01-29 DIAGNOSIS — H35373 Puckering of macula, bilateral: Secondary | ICD-10-CM | POA: Diagnosis not present

## 2021-02-07 DIAGNOSIS — Z23 Encounter for immunization: Secondary | ICD-10-CM | POA: Diagnosis not present

## 2021-02-14 DIAGNOSIS — Z23 Encounter for immunization: Secondary | ICD-10-CM | POA: Diagnosis not present

## 2021-02-18 DIAGNOSIS — H524 Presbyopia: Secondary | ICD-10-CM | POA: Diagnosis not present

## 2021-02-18 DIAGNOSIS — H348112 Central retinal vein occlusion, right eye, stable: Secondary | ICD-10-CM | POA: Diagnosis not present

## 2021-02-18 DIAGNOSIS — Z961 Presence of intraocular lens: Secondary | ICD-10-CM | POA: Diagnosis not present

## 2021-02-25 DIAGNOSIS — G8929 Other chronic pain: Secondary | ICD-10-CM | POA: Diagnosis not present

## 2021-02-25 DIAGNOSIS — M545 Low back pain, unspecified: Secondary | ICD-10-CM | POA: Diagnosis not present

## 2021-02-26 ENCOUNTER — Other Ambulatory Visit: Payer: Self-pay | Admitting: Neurosurgery

## 2021-02-26 DIAGNOSIS — M545 Low back pain, unspecified: Secondary | ICD-10-CM

## 2021-03-20 ENCOUNTER — Ambulatory Visit
Admission: RE | Admit: 2021-03-20 | Discharge: 2021-03-20 | Disposition: A | Discharge status: Home / Self Care | Payer: Medicare Other | Source: Ambulatory Visit | Attending: Neurosurgery | Admitting: Neurosurgery

## 2021-03-20 ENCOUNTER — Other Ambulatory Visit: Payer: Self-pay

## 2021-03-20 DIAGNOSIS — M47816 Spondylosis without myelopathy or radiculopathy, lumbar region: Secondary | ICD-10-CM | POA: Diagnosis not present

## 2021-03-20 DIAGNOSIS — M48061 Spinal stenosis, lumbar region without neurogenic claudication: Secondary | ICD-10-CM | POA: Diagnosis not present

## 2021-03-20 DIAGNOSIS — M545 Low back pain, unspecified: Secondary | ICD-10-CM

## 2021-03-20 DIAGNOSIS — M5127 Other intervertebral disc displacement, lumbosacral region: Secondary | ICD-10-CM | POA: Diagnosis not present

## 2021-03-20 MED ORDER — GADOBENATE DIMEGLUMINE 529 MG/ML IV SOLN
11.0000 mL | Freq: Once | INTRAVENOUS | Status: AC | PRN
  Administered 2021-03-20: 11 mL via INTRAVENOUS

## 2021-03-22 ENCOUNTER — Encounter: Payer: Self-pay | Admitting: Endocrinology

## 2021-03-26 DIAGNOSIS — H34211 Partial retinal artery occlusion, right eye: Secondary | ICD-10-CM | POA: Diagnosis not present

## 2021-03-26 DIAGNOSIS — H34811 Central retinal vein occlusion, right eye, with macular edema: Secondary | ICD-10-CM | POA: Diagnosis not present

## 2021-03-26 DIAGNOSIS — H35373 Puckering of macula, bilateral: Secondary | ICD-10-CM | POA: Diagnosis not present

## 2021-03-26 DIAGNOSIS — H353132 Nonexudative age-related macular degeneration, bilateral, intermediate dry stage: Secondary | ICD-10-CM | POA: Diagnosis not present

## 2021-03-27 DIAGNOSIS — I700 Atherosclerosis of aorta: Secondary | ICD-10-CM | POA: Diagnosis not present

## 2021-03-27 DIAGNOSIS — D649 Anemia, unspecified: Secondary | ICD-10-CM | POA: Diagnosis not present

## 2021-03-27 DIAGNOSIS — E871 Hypo-osmolality and hyponatremia: Secondary | ICD-10-CM | POA: Diagnosis not present

## 2021-03-27 DIAGNOSIS — M353 Polymyalgia rheumatica: Secondary | ICD-10-CM | POA: Diagnosis not present

## 2021-03-27 DIAGNOSIS — M48061 Spinal stenosis, lumbar region without neurogenic claudication: Secondary | ICD-10-CM | POA: Diagnosis not present

## 2021-03-27 DIAGNOSIS — I6523 Occlusion and stenosis of bilateral carotid arteries: Secondary | ICD-10-CM | POA: Diagnosis not present

## 2021-03-27 DIAGNOSIS — E559 Vitamin D deficiency, unspecified: Secondary | ICD-10-CM | POA: Diagnosis not present

## 2021-03-27 DIAGNOSIS — M810 Age-related osteoporosis without current pathological fracture: Secondary | ICD-10-CM | POA: Diagnosis not present

## 2021-03-27 DIAGNOSIS — I5189 Other ill-defined heart diseases: Secondary | ICD-10-CM | POA: Diagnosis not present

## 2021-03-27 DIAGNOSIS — E890 Postprocedural hypothyroidism: Secondary | ICD-10-CM | POA: Diagnosis not present

## 2021-03-27 DIAGNOSIS — C73 Malignant neoplasm of thyroid gland: Secondary | ICD-10-CM | POA: Diagnosis not present

## 2021-03-27 DIAGNOSIS — I679 Cerebrovascular disease, unspecified: Secondary | ICD-10-CM | POA: Diagnosis not present

## 2021-04-01 DIAGNOSIS — I10 Essential (primary) hypertension: Secondary | ICD-10-CM | POA: Diagnosis not present

## 2021-04-01 DIAGNOSIS — M48061 Spinal stenosis, lumbar region without neurogenic claudication: Secondary | ICD-10-CM | POA: Diagnosis not present

## 2021-04-01 DIAGNOSIS — M5136 Other intervertebral disc degeneration, lumbar region: Secondary | ICD-10-CM | POA: Diagnosis not present

## 2021-04-25 DIAGNOSIS — M5416 Radiculopathy, lumbar region: Secondary | ICD-10-CM | POA: Diagnosis not present

## 2021-07-03 ENCOUNTER — Encounter (HOSPITAL_COMMUNITY): Payer: Self-pay

## 2021-07-03 ENCOUNTER — Emergency Department (HOSPITAL_COMMUNITY): Payer: Medicare Other

## 2021-07-03 ENCOUNTER — Other Ambulatory Visit: Payer: Self-pay

## 2021-07-03 ENCOUNTER — Emergency Department (HOSPITAL_COMMUNITY)
Admission: EM | Admit: 2021-07-03 | Discharge: 2021-07-03 | Disposition: A | Discharge status: Home / Self Care | Payer: Medicare Other | Attending: Emergency Medicine | Admitting: Emergency Medicine

## 2021-07-03 DIAGNOSIS — W182XXA Fall in (into) shower or empty bathtub, initial encounter: Secondary | ICD-10-CM | POA: Insufficient documentation

## 2021-07-03 DIAGNOSIS — S0990XA Unspecified injury of head, initial encounter: Secondary | ICD-10-CM

## 2021-07-03 DIAGNOSIS — W19XXXA Unspecified fall, initial encounter: Secondary | ICD-10-CM

## 2021-07-03 DIAGNOSIS — Z7982 Long term (current) use of aspirin: Secondary | ICD-10-CM | POA: Diagnosis not present

## 2021-07-03 DIAGNOSIS — M545 Low back pain, unspecified: Secondary | ICD-10-CM

## 2021-07-03 DIAGNOSIS — M542 Cervicalgia: Secondary | ICD-10-CM | POA: Diagnosis not present

## 2021-07-03 DIAGNOSIS — Y92002 Bathroom of unspecified non-institutional (private) residence single-family (private) house as the place of occurrence of the external cause: Secondary | ICD-10-CM | POA: Insufficient documentation

## 2021-07-03 DIAGNOSIS — Z91040 Latex allergy status: Secondary | ICD-10-CM | POA: Insufficient documentation

## 2021-07-03 MED ORDER — FENTANYL CITRATE PF 50 MCG/ML IJ SOSY
50.0000 ug | PREFILLED_SYRINGE | Freq: Once | INTRAMUSCULAR | Status: AC
  Administered 2021-07-03: 50 ug via INTRAVENOUS
  Filled 2021-07-03: qty 1

## 2021-07-03 NOTE — ED Triage Notes (Signed)
BIB EMS from home for fall, slipped in bathroom, no LOC or blood thinners, hurt back, head, and tail bone

## 2021-07-03 NOTE — Discharge Instructions (Signed)
You were seen today after a fall.  Your CT scan was negative.  Your x-rays are reassuring.  Make sure that you are being careful when ambulating at home.  Remove any major obstacles.

## 2021-07-03 NOTE — ED Provider Notes (Signed)
[facility] DEPT Provider Note   CSN: [id number] Arrival date & time: 07/03/21  0121     History  Chief Complaint  Patient presents with   Lorraine Brandt is a 88 y.o. female.  HPI     This is an 88 year old female who presents following a fall.  Patient reports that she was wearing socks when she got up to go to the bathroom.  She slipped and fell.  She states that she hit her head and fell onto her back.  She did not lose consciousness.  She is not on any blood thinners.  She is reporting mostly lower back pain.  She does her endorse some neck discomfort.  No numbness or tingling of the upper or lower extremities.  Has not taken anything for her pain.  Denies recent illnesses, fever, cough, abdominal pain, chest pain.  Home Medications Prior to Admission medications   Medication Sig Start Date End Date Taking? Authorizing Provider  ACETAMINOPHEN PO Take 650-1,300 mg by mouth every 6 (six) hours as needed for moderate pain or headache.   Yes [provider]  Ascorbic Acid (VITAMIN C) 500 MG CAPS Take 1 tablet by mouth daily.   Yes [provider]  CALCIUM-VITAMIN D PO Take 1 tablet by mouth in the morning and at bedtime.    Yes [provider]  Carboxymethylcellul-Glycerin (LUBRICATING EYE DROPS OP) Place 1 drop into both eyes daily as needed (dry eyes).   Yes [provider]  cholecalciferol (VITAMIN D3) 25 MCG (1000 UT) tablet Take 1,000 Units by mouth daily.   Yes [provider]  diltiazem (CARDIZEM CD) 300 MG 24 hr capsule Take 300 mg by mouth every morning.    Yes [provider]  docusate sodium (COLACE) 100 MG capsule Take 100 mg by mouth daily as needed for mild constipation.   Yes [provider]  escitalopram (LEXAPRO) 5 MG tablet Take 5 mg by mouth daily. 04/01/19  Yes [provider]  levothyroxine (SYNTHROID, LEVOTHROID) 125 MCG tablet Take 125 mcg by  mouth every Monday, Tuesday, Wednesday, Thursday, and Friday. On an empty stomach.   Yes [provider]  losartan (COZAAR) 100 MG tablet Take 100 mg by mouth daily.   Yes [provider]  Omega-3 Fatty Acids (FISH OIL PO) Take 2,000 mg by mouth daily.   Yes [provider]  omeprazole (PRILOSEC) 20 MG capsule Take 20 mg by mouth every morning.    Yes [provider]  predniSONE (DELTASONE) 5 MG tablet Take 15 mg by mouth daily with breakfast.   Yes [provider]  rosuvastatin (CRESTOR) 20 MG tablet Take 1 tablet (20 mg total) by mouth daily. 07/07/19 07/03/22 Yes Skeet Latch, MD  aspirin EC 81 MG tablet Take 81 mg by mouth daily. Patient not taking: Reported on 07/03/2021    [provider]      Allergies    Oxycodone, Celebrex [celecoxib], and Latex    Review of Systems   Review of Systems  Constitutional:  Negative for fever.  Respiratory:  Negative for shortness of breath.   Musculoskeletal:  Positive for back pain and neck pain.  All other systems reviewed and are negative.  Physical Exam Updated Vital Signs BP (!) 167/70    Pulse 76    Temp (!) 97.4 F (36.3 C) (Oral)    Resp 18    Ht 1.549 m (5\' 1" )    Wt 56.7  kg    SpO2 96%    BMI 23.62 kg/m  Physical Exam Vitals and nursing note reviewed.  Constitutional:      Appearance: She is well-developed. She is not ill-appearing.  HENT:     Head: Normocephalic and atraumatic.     Nose: Nose normal.     Mouth/Throat:     Mouth: Mucous membranes are moist.  Eyes:     Pupils: Pupils are equal, round, and reactive to light.  Neck:     Comments: Tenderness to palpation lower C-spine without step-off or deformity noted Cardiovascular:     Rate and Rhythm: Normal rate and regular rhythm.     Heart sounds: Normal heart sounds.  Pulmonary:     Effort: Pulmonary effort is normal. No respiratory distress.     Breath sounds: No wheezing.  Abdominal:     Palpations: Abdomen is  soft.     Tenderness: There is no abdominal tenderness.  Musculoskeletal:     Cervical back: Normal range of motion and neck supple.     Comments: No midline L-spine tenderness to palpation, prior scarring noted  Skin:    General: Skin is warm and dry.  Neurological:     Mental Status: She is alert and oriented to person, place, and time.     Comments: 5 out of 5 strength in all 4 extremities  Psychiatric:        Mood and Affect: Mood normal.    ED Results / Procedures / Treatments   Labs (all labs ordered are listed, but only abnormal results are displayed) Labs Reviewed - No data to display  EKG None  Radiology DG Lumbar Spine Complete  Result Date: 07/03/2021 CLINICAL DATA:  Status post fall with lower back pain. EXAM: LUMBAR SPINE - COMPLETE 4+ VIEW COMPARISON:  February 25, 2021 FINDINGS: There is no evidence of acute lumbar spine fracture. Mild dextroscoliosis is seen with stable grade 1 anterolisthesis of the L4 vertebral body on L5. Mild to moderate severity multilevel endplate sclerosis is seen. Stable marked severity intervertebral disc space narrowing is noted at the level of L4-L5. IMPRESSION: 1. No evidence of acute lumbar spine fracture. 2. Stable marked severity degenerative disc disease at L4-L5 with stable grade 1 anterolisthesis of the L4 vertebral body. Electronically Signed   By: Virgina Norfolk M.D.   On: 07/03/2021 02:47   CT Head Wo Contrast  Result Date: 07/03/2021 CLINICAL DATA:  Recent slip and fall with headaches and neck pain, initial encounter EXAM: CT HEAD WITHOUT CONTRAST CT CERVICAL SPINE WITHOUT CONTRAST TECHNIQUE: Multidetector CT imaging of the head and cervical spine was performed following the standard protocol without intravenous contrast. Multiplanar CT image reconstructions of the cervical spine were also generated. RADIATION DOSE REDUCTION: This exam was performed according to the departmental dose-optimization program which includes automated  exposure control, adjustment of the mA and/or kV according to patient size and/or use of iterative reconstruction technique. COMPARISON:  04/18/2019 FINDINGS: CT HEAD FINDINGS Brain: No evidence of acute infarction, hemorrhage, hydrocephalus, extra-axial collection or mass lesion/mass effect. Chronic atrophic and ischemic changes are noted. Vascular: No hyperdense vessel or unexpected calcification. Skull: Normal. Negative for fracture or focal lesion. Sinuses/Orbits: No acute finding. Other: None. CT CERVICAL SPINE FINDINGS Alignment: Loss of the normal cervical lordosis is noted. Anterolisthesis of C4 on C5 is seen felt to be degenerative in nature. Skull base and vertebrae: 7 cervical segments are well visualized. Vertebral body height is well maintained. At least partial fusion at  C5-6 is noted. Anterolisthesis of C4 on C5 is noted of a degenerative nature. No acute fracture or acute facet abnormality is noted. Multilevel facet hypertrophic changes are seen. Significant scoliosis in the upper thoracic spine is noted concave to the left. There are findings suspicious for hemi vertebra between C6 and T1 vertebra on the left. Soft tissues and spinal canal: Surrounding soft tissue structures show vascular calcifications. Upper chest: Visualized lung apices are within normal limits. Other: None IMPRESSION: CT of the head: Chronic atrophic and ischemic changes. CT of the cervical spine: Multilevel degenerative change. No acute abnormality noted. 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 Electronically Signed   By: Inez Catalina M.D.   On: 07/03/2021 04:02   CT Cervical Spine Wo Contrast  Result Date: 07/03/2021 CLINICAL DATA:  Recent slip and fall with headaches and neck pain, initial encounter EXAM: CT HEAD WITHOUT CONTRAST CT CERVICAL SPINE WITHOUT CONTRAST TECHNIQUE: Multidetector CT imaging of the head and cervical spine was performed following the standard protocol without intravenous contrast. Multiplanar  CT image reconstructions of the cervical spine were also generated. RADIATION DOSE REDUCTION: This exam was performed according to the departmental dose-optimization program which includes automated exposure control, adjustment of the mA and/or kV according to patient size and/or use of iterative reconstruction technique. COMPARISON:  04/18/2019 FINDINGS: CT HEAD FINDINGS Brain: No evidence of acute infarction, hemorrhage, hydrocephalus, extra-axial collection or mass lesion/mass effect. Chronic atrophic and ischemic changes are noted. Vascular: No hyperdense vessel or unexpected calcification. Skull: Normal. Negative for fracture or focal lesion. Sinuses/Orbits: No acute finding. Other: None. CT CERVICAL SPINE FINDINGS Alignment: Loss of the normal cervical lordosis is noted. Anterolisthesis of C4 on C5 is seen felt to be degenerative in nature. Skull base and vertebrae: 7 cervical segments are well visualized. Vertebral body height is well maintained. At least partial fusion at C5-6 is noted. Anterolisthesis of C4 on C5 is noted of a degenerative nature. No acute fracture or acute facet abnormality is noted. Multilevel facet hypertrophic changes are seen. Significant scoliosis in the upper thoracic spine is noted concave to the left. There are findings suspicious for hemi vertebra between C6 and T1 vertebra on the left. Soft tissues and spinal canal: Surrounding soft tissue structures show vascular calcifications. Upper chest: Visualized lung apices are within normal limits. Other: None IMPRESSION: CT of the head: Chronic atrophic and ischemic changes. CT of the cervical spine: Multilevel degenerative change. No acute abnormality noted. 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 Electronically Signed   By: Inez Catalina M.D.   On: 07/03/2021 04:02    Procedures Procedures    Medications Ordered in ED Medications  fentaNYL (SUBLIMAZE) injection 50 mcg (50 mcg Intravenous Given 07/03/21 0253)    ED Course/  Medical Decision Making/ A&P                           Medical Decision Making Amount and/or Complexity of Data Reviewed Radiology: ordered.  Risk Prescription drug management.   This patient presents to the ED for concern of fall, this involves an extensive number of treatment options, and is a complaint that carries with it a high risk of complications and morbidity.  The differential diagnosis includes head injury, neck injury  MDM:    This is an 88 year old female who presents after mechanical fall.  She has total recollection of the events.  She is not on blood thinners.  She is able to  provide history.  She has head and back pain.  She is neurologically intact.  CT head neck obtained as well as plain films of the back.  CT head neck without intracranial injury or cervical fracture.  No obvious lumbar fractures.  Patient is ambulatory with minimal pain.  Suspect contusion. (Labs, imaging)  Labs: I Ordered, and personally interpreted labs.  The pertinent results include: None  Imaging Studies ordered: I ordered imaging studies including CT head, cervical spine, lumbar films I independently visualized and interpreted imaging. I agree with the radiologist interpretation  Additional history obtained from patient.  External records from outside source obtained and reviewed including prior visits  Critical Interventions: IV pain medication  Consultations: I requested consultation with the none,  and discussed lab and imaging findings as well as pertinent plan - they recommend: None  Cardiac Monitoring: The patient was maintained on a cardiac monitor.  I personally viewed and interpreted the cardiac monitored which showed an underlying rhythm of: Sinus rhythm  Reevaluation: After the interventions noted above, I reevaluated the patient and found that they have :improved   Considered admission for: N/A  Social Determinants of Health: Lives independently  Disposition:  Discharge  Co morbidities that complicate the patient evaluation  Past Medical History:  Diagnosis Date   Arthritis    oa   Cancer ([place])    basal cell skin cancer, multiple areas removed, thryoid   Central retinal artery occlusion 07/07/2019   DJD (degenerative joint disease)    Essential hypertension 07/07/2019   GERD (gastroesophageal reflux disease)    takes prilosec    Hypertension    Hypothyroidism since 2001   Osteoporosis    Polymyalgia rheumatica ([place])    Pure hypercholesterolemia 07/07/2019   Sleep apnea    uses mouthpiece somnodent, pt wishes to leave mouthpiece at home, pt told moderate severe sleep apnea   Thyroid disease    Vaginal delivery 1958, [id number]     Medicines Meds ordered this encounter  Medications   fentaNYL (SUBLIMAZE) injection 50 mcg    I have reviewed the patients home medicines and have made adjustments as needed  Problem List / ED Course: Problem List Items Addressed This Visit   None Visit Diagnoses     Fall, initial encounter    -  Primary   Minor head injury, initial encounter       Acute midline low back pain without sciatica       Relevant Medications   predniSONE (DELTASONE) 5 MG tablet                   Final Clinical Impression(s) / ED Diagnoses Final diagnoses:  Fall, initial encounter  Minor head injury, initial encounter  Acute midline low back pain without sciatica    Rx / DC Orders ED Discharge Orders     None         Merryl Hacker, MD 07/03/21 [phone]

## 2021-07-04 ENCOUNTER — Other Ambulatory Visit: Payer: Self-pay

## 2021-07-04 ENCOUNTER — Emergency Department (HOSPITAL_COMMUNITY): Payer: Medicare Other

## 2021-07-04 ENCOUNTER — Emergency Department (HOSPITAL_COMMUNITY)
Admission: EM | Admit: 2021-07-04 | Discharge: 2021-07-04 | Disposition: A | Discharge status: Home / Self Care | Payer: Medicare Other | Attending: Emergency Medicine | Admitting: Emergency Medicine

## 2021-07-04 DIAGNOSIS — Z7982 Long term (current) use of aspirin: Secondary | ICD-10-CM | POA: Diagnosis not present

## 2021-07-04 DIAGNOSIS — W19XXXA Unspecified fall, initial encounter: Secondary | ICD-10-CM

## 2021-07-04 DIAGNOSIS — Z79899 Other long term (current) drug therapy: Secondary | ICD-10-CM | POA: Diagnosis not present

## 2021-07-04 DIAGNOSIS — S12501A Unspecified nondisplaced fracture of sixth cervical vertebra, initial encounter for closed fracture: Secondary | ICD-10-CM | POA: Diagnosis not present

## 2021-07-04 DIAGNOSIS — Z91040 Latex allergy status: Secondary | ICD-10-CM | POA: Diagnosis not present

## 2021-07-04 DIAGNOSIS — S0990XA Unspecified injury of head, initial encounter: Secondary | ICD-10-CM | POA: Diagnosis present

## 2021-07-04 NOTE — Discharge Instructions (Addendum)
You were seen in the emergency department today for a fall.  You were found to have a fracture of your C6 and C7 vertebrae.  While you are here we talked with the neurosurgeon who would like to see you in the outpatient clinic in 2 to 4 weeks.  They are requesting that you continue to wear your cervical collar throughout that time.  Please do not take off the cervical collar for any reason.  Please return to the emergency department if you have weakness in your arms or legs.
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
## 2021-07-04 NOTE — ED Notes (Signed)
Ambulates without assistance. Steady on feet.

## 2021-07-04 NOTE — ED Notes (Signed)
Pt verbalized understanding of d/c instructions, meds, and followup care. Denies questions. VSS, no distress noted. Steady gait to exit with all belongings.  ?
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
## 2021-07-08 ENCOUNTER — Encounter: Payer: Self-pay | Admitting: Podiatrist

## 2021-07-08 ENCOUNTER — Other Ambulatory Visit: Payer: Self-pay

## 2021-07-08 ENCOUNTER — Ambulatory Visit (INDEPENDENT_AMBULATORY_CARE_PROVIDER_SITE_OTHER): Payer: Medicare Other | Admitting: Podiatrist

## 2021-07-08 DIAGNOSIS — R234 Changes in skin texture: Secondary | ICD-10-CM | POA: Diagnosis not present

## 2021-07-08 DIAGNOSIS — L89601 Pressure ulcer of unspecified heel, stage 1: Secondary | ICD-10-CM

## 2021-07-08 NOTE — Progress Notes (Signed)
Chief Complaint  Patient presents with   Foot Pain    Heel pain started about 3 weeks ago has fallen 4 times is interested in injections to help with the pain      HPI: Patient is 88 y.o. female who presents today for heel pain of bilateral feet. The pain started about 3 weeks ago and is equal in both heels.  She relates she has fallen due to the amount of pain she is experiencing.  Prior to 3 weeks ago she was able to ambulate without assistance.  Today, she is unable to walk without assistance and is seen in the exam room in a wheelchair.  She does have spinal stenosis and is getting ready to start physical therapy/ rehab for concerns of loss of strength.   Patient Active Problem List   Diagnosis Date Noted   Retinal artery occlusion    Central retinal artery occlusion 07/07/2019   Essential hypertension 07/07/2019   Pure hypercholesterolemia 07/07/2019   Postoperative state 11/02/2014   OA (osteoarthritis) of knee 04/25/2014    Current Outpatient Medications on File Prior to Visit  Medication Sig Dispense Refill   ACETAMINOPHEN PO Take 650-1,300 mg by mouth every 6 (six) hours as needed for moderate pain or headache.     Ascorbic Acid (VITAMIN C) 500 MG CAPS Take 1 tablet by mouth daily.     aspirin EC 81 MG tablet Take 81 mg by mouth daily. (Patient not taking: Reported on 07/03/2021)     CALCIUM-VITAMIN D PO Take 1 tablet by mouth in the morning and at bedtime.      Carboxymethylcellul-Glycerin (LUBRICATING EYE DROPS OP) Place 1 drop into both eyes daily as needed (dry eyes).     cholecalciferol (VITAMIN D3) 25 MCG (1000 UT) tablet Take 1,000 Units by mouth daily.     diltiazem (CARDIZEM CD) 300 MG 24 hr capsule Take 300 mg by mouth every morning.      docusate sodium (COLACE) 100 MG capsule Take 100 mg by mouth daily as needed for mild constipation.     escitalopram (LEXAPRO) 5 MG tablet Take 5 mg by mouth daily.     levothyroxine (SYNTHROID, LEVOTHROID) 125 MCG tablet Take 125  mcg by mouth every Monday, Tuesday, Wednesday, Thursday, and Friday. On an empty stomach.     losartan (COZAAR) 100 MG tablet Take 100 mg by mouth daily.     Omega-3 Fatty Acids (FISH OIL PO) Take 2,000 mg by mouth daily.     omeprazole (PRILOSEC) 20 MG capsule Take 20 mg by mouth every morning.      predniSONE (DELTASONE) 5 MG tablet Take 15 mg by mouth daily with breakfast.     rosuvastatin (CRESTOR) 20 MG tablet Take 1 tablet (20 mg total) by mouth daily. 90 tablet 3   No current facility-administered medications on file prior to visit.    Allergies  Allergen Reactions   Oxycodone Other (See Comments)    Makes me crazy   Celebrex [Celecoxib] Itching and Rash    Had itching, burning and rash to it. Likely adverse effect rather than allergic.   Latex Rash    Review of Systems No fevers, chills, nausea, muscle aches, no difficulty breathing, no calf pain, no chest pain or shortness of breath.   Physical Exam  GENERAL APPEARANCE: Alert, conversant. Appropriately groomed. No acute distress.   VASCULAR: Pedal pulses palpable DP and PT bilateral.  Capillary refill time is immediate to all digits,  Proximal to distal cooling  it warm to warm.  Digital perfusion adequate.   NEUROLOGIC: sensation is intact to 5.07 monofilament at 5/5 sites bilateral.  Light touch is intact bilateral, vibratory sensation intact bilateral  MUSCULOSKELETAL: acceptable muscle strength, tone and stability bilateral.  No gross boney pedal deformities noted.  Forefoot cavus deformity is seen which is flexible in nature. No pain along the plantar medial aspect of bilateral heels is noted.  No pain with medial to lateral compression of the calcaneus is seen. No indication of plantar fasciitis or calcaneal bursitis is noted bilateral.   DERMATOLOGIC: skin is warm, supple, and dry.  No open lesions noted.  Softness with skin fissures in the posterior aspect of bilateral heels is noted. Pain on palpation at the  posterior central aspect of bilateral heels is noted. No bruising is noted, no open sores seen.     Assessment     ICD-10-CM   1. Pressure injury of heel, stage 1, unspecified laterality  L89.601     2. Fissure in skin of both feet  R23.4        Plan  Discussed exam findings with the patient and her caregiver.  Discussed the heel pain may be from pressure on the heels for an extended amount of time which can cause pain.  I recommended offloading foam booties/ heel protectors which were dispensed for her at today's visit.  I also recommended floating the heels on pillows when resting during the day. I recommended utilizing a good moisturizing ointment or cream on skin fissures and gave recommendations for use.   I also believe the spinal stenosis is a causative factor in her pain and weakness and agree that rehab will be the most beneficial treatment for both.   She will try the offloading recommendations and will call if she fails to improve.

## 2021-07-08 NOTE — Patient Instructions (Signed)
You want to "float" your heels when you are resting or sleeping at night.  During the day you may use pillows under your legs to keep pressure off your heels.  At night, it is more helpful to use the blue foam booties which will keep the pressure off the back of the heels  Also, moisturize the heels with Aquaphor or a thick cream like flexitol heel balm to soften the skin.   Likely, the main cause for the weakness and pain in your heels is from your spinal stenosis-  rehab will be beneficial    Pressure Injury A pressure injury is damage to the skin and underlying tissue that results from pressure being applied to an area of the body. It often affects people who must spend a long time in a bed or chair because of a medical condition. Pressure injuries usually occur: Over bony parts of the body, such as the tailbone, shoulders, elbows, hips, heels, spine, ankles, and back of the head. Under medical devices that make contact with the body, such as respiratory equipment, stockings, tubes, and splints. Pressure injuries start as reddened areas on the skin and can lead to pain and an open wound. What are the causes? This condition is caused by frequent or constant pressure to an area of the body. Decreased blood flow to the skin can eventually cause the skin tissue to die and break down, causing a wound. What increases the risk? You are more likely to develop this condition if you: Are in the hospital or an extended care facility. Are bedridden or in a wheelchair. Have an injury or disease that keeps you from: Moving normally. Feeling pain or pressure. Have a condition that: Makes you sleepy or less alert. Causes poor blood flow. Need to wear a medical device. Have poor control of your bladder or bowel functions (incontinence). Have poor nutrition (malnutrition). If you are at risk for pressure injuries, your health care provider may recommend certain types of mattresses, mattress covers,  pillows, cushions, or boots to help prevent them. These may include products filled with air, foam, gel, or sand. What are the signs or symptoms? Symptoms of this condition depend on the severity of the injury. Symptoms may include: Red or dark areas of the skin. Pain, warmth, or a change of skin texture. Blisters. An open wound. How is this diagnosed? This condition is diagnosed with a medical history and physical exam. You may also have tests, such as: Blood tests. Imaging tests. Blood flow tests. Your pressure injury will be staged based on its severity. Staging is based on: The depth of the tissue injury, including whether there is exposure of muscle, bone, or tendon. The cause of the pressure injury. How is this treated? This condition may be treated by: Relieving or redistributing pressure on your skin. This includes: Frequently changing your position. Avoiding positions that caused the wound or that can make the wound worse. Using specific bed mattresses, chair cushions, or protective boots. Moving medical devices from an area of pressure, or placing padding between the skin and the device. Using foams, creams, or powders to prevent rubbing (friction) on the skin. Keeping your skin clean and dry. This may include using a skin cleanser or skin barrier as told by your health care provider. Cleaning your injury and removing any dead tissue from the wound (debridement). Placing a bandage (dressing) over your injury. Using medicines for pain or to prevent or treat infection. Surgery may be needed if other treatments  are not working or if your injury is very deep. Follow these instructions at home: Wound care Follow instructions from your health care provider about how to take care of your wound. Make sure you: Wash your hands with soap and water before and after you change your bandage (dressing). If soap and water are not available, use hand sanitizer. Change your dressing as told  by your health care provider.  Check your wound every day for signs of infection. Have a caregiver do this for you if you are not able. Check for: Redness, swelling, or increased pain. More fluid or blood. Warmth. Pus or a bad smell. Skin care Keep your skin clean and dry. Gently pat your skin dry. Do not rub or massage your skin. You or a caregiver should check your skin every day for any changes in color or any new blisters or sores (ulcers). Medicines Take over-the-counter and prescription medicines only as told by your health care provider. If you were prescribed an antibiotic medicine, take or apply it as told by your health care provider. Do not stop using the antibiotic even if your condition improves. Reducing and redistributing pressure Do not lie or sit in one position for a long time. Move or change position every 1-2 hours, or as told by your health care provider. Use pillows or cushions to reduce pressure. Ask your health care provider to recommend cushions or pads for you. General instructions  Eat a healthy diet that includes lots of protein. Drink enough fluid to keep your urine pale yellow. Be as active as you can every day. Ask your health care provider to suggest safe exercises or activities. Do not abuse drugs or alcohol. Do not use any products that contain nicotine or tobacco, such as cigarettes, e-cigarettes, and chewing tobacco. If you need help quitting, ask your health care provider. Keep all follow-up visits as told by your health care provider. This is important. Contact a health care provider if: You have: A fever or chills. Pain that is not helped by medicine. Any changes in skin color. New blisters or sores. Pus or a bad smell coming from your wound. Redness, swelling, or pain around your wound. More fluid or blood coming from your wound. Your wound does not improve after 1-2 weeks of treatment. Summary A pressure injury is damage to the skin and  underlying tissue that results from pressure being applied to an area of the body. Do not lie or sit in one position for a long time. Your health care provider may advise you to move or change position every 1-2 hours. Follow instructions from your health care provider about how to take care of your wound. Keep all follow-up visits as told by your health care provider. This is important. This information is not intended to replace advice given to you by your health care provider. Make sure you discuss any questions you have with your health care provider. Document Revised: 11/25/2017 Document Reviewed: 11/25/2017 Elsevier Patient Education  [place].

## 2021-07-10 ENCOUNTER — Ambulatory Visit (HOSPITAL_COMMUNITY)
Admission: RE | Admit: 2021-07-10 | Discharge: 2021-07-10 | Disposition: A | Discharge status: Home / Self Care | Payer: Medicare Other | Source: Ambulatory Visit | Attending: Neurosurgery | Admitting: Neurosurgery

## 2021-07-10 ENCOUNTER — Other Ambulatory Visit (HOSPITAL_COMMUNITY): Payer: Self-pay | Admitting: Neurosurgery

## 2021-07-10 ENCOUNTER — Other Ambulatory Visit: Payer: Self-pay | Admitting: Neurosurgery

## 2021-07-10 DIAGNOSIS — M542 Cervicalgia: Secondary | ICD-10-CM

## 2021-07-10 DIAGNOSIS — E222 Syndrome of inappropriate secretion of antidiuretic hormone: Secondary | ICD-10-CM | POA: Diagnosis not present

## 2021-07-10 DIAGNOSIS — E871 Hypo-osmolality and hyponatremia: Secondary | ICD-10-CM | POA: Diagnosis not present

## 2021-07-12 ENCOUNTER — Inpatient Hospital Stay (HOSPITAL_COMMUNITY): Payer: Medicare Other

## 2021-07-12 ENCOUNTER — Other Ambulatory Visit: Payer: Self-pay

## 2021-07-12 ENCOUNTER — Emergency Department (HOSPITAL_COMMUNITY): Payer: Medicare Other

## 2021-07-12 ENCOUNTER — Inpatient Hospital Stay (HOSPITAL_COMMUNITY)
Admission: EM | Admit: 2021-07-12 | Discharge: 2021-07-18 | DRG: 643 | Disposition: A | Discharge status: Skilled Nursing Facility | Payer: Medicare Other | Attending: Internal Medicine | Admitting: Internal Medicine

## 2021-07-12 ENCOUNTER — Encounter (HOSPITAL_COMMUNITY): Payer: Self-pay

## 2021-07-12 DIAGNOSIS — K219 Gastro-esophageal reflux disease without esophagitis: Secondary | ICD-10-CM | POA: Diagnosis present

## 2021-07-12 DIAGNOSIS — S12600A Unspecified displaced fracture of seventh cervical vertebra, initial encounter for closed fracture: Secondary | ICD-10-CM | POA: Diagnosis present

## 2021-07-12 DIAGNOSIS — Z79899 Other long term (current) drug therapy: Secondary | ICD-10-CM

## 2021-07-12 DIAGNOSIS — E890 Postprocedural hypothyroidism: Secondary | ICD-10-CM | POA: Diagnosis present

## 2021-07-12 DIAGNOSIS — Z20822 Contact with and (suspected) exposure to covid-19: Secondary | ICD-10-CM | POA: Diagnosis present

## 2021-07-12 DIAGNOSIS — M353 Polymyalgia rheumatica: Secondary | ICD-10-CM | POA: Diagnosis present

## 2021-07-12 DIAGNOSIS — I5033 Acute on chronic diastolic (congestive) heart failure: Secondary | ICD-10-CM

## 2021-07-12 DIAGNOSIS — R54 Age-related physical debility: Secondary | ICD-10-CM | POA: Diagnosis present

## 2021-07-12 DIAGNOSIS — M810 Age-related osteoporosis without current pathological fracture: Secondary | ICD-10-CM | POA: Diagnosis present

## 2021-07-12 DIAGNOSIS — Z90710 Acquired absence of both cervix and uterus: Secondary | ICD-10-CM

## 2021-07-12 DIAGNOSIS — R0602 Shortness of breath: Secondary | ICD-10-CM

## 2021-07-12 DIAGNOSIS — R0902 Hypoxemia: Secondary | ICD-10-CM

## 2021-07-12 DIAGNOSIS — J9811 Atelectasis: Secondary | ICD-10-CM | POA: Diagnosis present

## 2021-07-12 DIAGNOSIS — Z7952 Long term (current) use of systemic steroids: Secondary | ICD-10-CM

## 2021-07-12 DIAGNOSIS — Z85828 Personal history of other malignant neoplasm of skin: Secondary | ICD-10-CM

## 2021-07-12 DIAGNOSIS — R410 Disorientation, unspecified: Secondary | ICD-10-CM

## 2021-07-12 DIAGNOSIS — R350 Frequency of micturition: Secondary | ICD-10-CM | POA: Diagnosis present

## 2021-07-12 DIAGNOSIS — J9601 Acute respiratory failure with hypoxia: Secondary | ICD-10-CM | POA: Diagnosis present

## 2021-07-12 DIAGNOSIS — E876 Hypokalemia: Secondary | ICD-10-CM | POA: Diagnosis present

## 2021-07-12 DIAGNOSIS — Z79890 Hormone replacement therapy (postmenopausal): Secondary | ICD-10-CM

## 2021-07-12 DIAGNOSIS — F32A Depression, unspecified: Secondary | ICD-10-CM | POA: Diagnosis present

## 2021-07-12 DIAGNOSIS — F05 Delirium due to known physiological condition: Secondary | ICD-10-CM | POA: Diagnosis not present

## 2021-07-12 DIAGNOSIS — R3915 Urgency of urination: Secondary | ICD-10-CM | POA: Diagnosis present

## 2021-07-12 DIAGNOSIS — S12500A Unspecified displaced fracture of sixth cervical vertebra, initial encounter for closed fracture: Secondary | ICD-10-CM | POA: Diagnosis present

## 2021-07-12 DIAGNOSIS — Y92009 Unspecified place in unspecified non-institutional (private) residence as the place of occurrence of the external cause: Secondary | ICD-10-CM

## 2021-07-12 DIAGNOSIS — I480 Paroxysmal atrial fibrillation: Secondary | ICD-10-CM | POA: Diagnosis present

## 2021-07-12 DIAGNOSIS — E871 Hypo-osmolality and hyponatremia: Secondary | ICD-10-CM

## 2021-07-12 DIAGNOSIS — Z96651 Presence of right artificial knee joint: Secondary | ICD-10-CM | POA: Diagnosis present

## 2021-07-12 DIAGNOSIS — I110 Hypertensive heart disease with heart failure: Secondary | ICD-10-CM | POA: Diagnosis present

## 2021-07-12 DIAGNOSIS — E7800 Pure hypercholesterolemia, unspecified: Secondary | ICD-10-CM | POA: Diagnosis present

## 2021-07-12 DIAGNOSIS — E222 Syndrome of inappropriate secretion of antidiuretic hormone: Principal | ICD-10-CM | POA: Diagnosis present

## 2021-07-12 DIAGNOSIS — Z85850 Personal history of malignant neoplasm of thyroid: Secondary | ICD-10-CM

## 2021-07-12 DIAGNOSIS — M179 Osteoarthritis of knee, unspecified: Secondary | ICD-10-CM | POA: Diagnosis present

## 2021-07-12 DIAGNOSIS — I4892 Unspecified atrial flutter: Secondary | ICD-10-CM | POA: Diagnosis present

## 2021-07-12 DIAGNOSIS — Z803 Family history of malignant neoplasm of breast: Secondary | ICD-10-CM | POA: Diagnosis not present

## 2021-07-12 DIAGNOSIS — W1830XA Fall on same level, unspecified, initial encounter: Secondary | ICD-10-CM | POA: Diagnosis present

## 2021-07-12 DIAGNOSIS — W19XXXA Unspecified fall, initial encounter: Secondary | ICD-10-CM

## 2021-07-12 DIAGNOSIS — I10 Essential (primary) hypertension: Secondary | ICD-10-CM | POA: Diagnosis present

## 2021-07-12 DIAGNOSIS — R296 Repeated falls: Secondary | ICD-10-CM | POA: Diagnosis present

## 2021-07-12 LAB — COMPREHENSIVE METABOLIC PANEL
ALT: 25 U/L (ref 0–44)
AST: 27 U/L (ref 15–41)
Albumin: 3.3 g/dL — ABNORMAL LOW (ref 3.5–5.0)
Alkaline Phosphatase: 73 U/L (ref 38–126)
Anion gap: 8 (ref 5–15)
BUN: 16 mg/dL (ref 8–23)
CO2: 24 mmol/L (ref 22–32)
Calcium: 7.9 mg/dL — ABNORMAL LOW (ref 8.9–10.3)
Chloride: 82 mmol/L — ABNORMAL LOW (ref 98–111)
Creatinine, Ser: 0.46 mg/dL (ref 0.44–1.00)
GFR, Estimated: >60 mL/min (ref >60)
Glucose, Bld: 86 mg/dL (ref 70–99)
Potassium: 3.8 mmol/L (ref 3.5–5.1)
Sodium: 114 mmol/L — CL (ref 135–145)
Total Bilirubin: 1.3 mg/dL — ABNORMAL HIGH (ref 0.3–1.2)
Total Protein: 5.3 g/dL — ABNORMAL LOW (ref 6.5–8.1)

## 2021-07-12 LAB — URINALYSIS, ROUTINE W REFLEX MICROSCOPIC
Bilirubin Urine: NEGATIVE
Glucose, UA: NEGATIVE mg/dL
Hgb urine dipstick: NEGATIVE
Ketones, ur: NEGATIVE mg/dL
Leukocytes,Ua: NEGATIVE
Nitrite: NEGATIVE
Protein, ur: NEGATIVE mg/dL
Specific Gravity, Urine: 1.015 (ref 1.005–1.030)
pH: 7 (ref 5.0–8.0)

## 2021-07-12 LAB — ECHOCARDIOGRAM COMPLETE
AR max vel: 1.76 cm2
AV Area VTI: 1.78 cm2
AV Area mean vel: 1.76 cm2
AV Mean grad: 7 mmHg
AV Peak grad: 13.3 mmHg
Ao pk vel: 1.83 m/s
Area-P 1/2: 3.42 cm2
Calc EF: 77.5 %
Height: 61 in
MV VTI: 1.9 cm2
P 1/2 time: 565 msec
S' Lateral: 1.4 cm
Single Plane A2C EF: 72.8 %
Single Plane A4C EF: 78.2 %
Weight: 2000.01 oz

## 2021-07-12 LAB — CBC
HCT: 36.5 % (ref 36.0–46.0)
Hemoglobin: 13.5 g/dL (ref 12.0–15.0)
MCH: 33.1 pg (ref 26.0–34.0)
MCHC: 37 g/dL — ABNORMAL HIGH (ref 30.0–36.0)
MCV: 89.5 fL (ref 80.0–100.0)
Platelets: 268 10*3/uL (ref 150–400)
RBC: 4.08 MIL/uL (ref 3.87–5.11)
RDW: 14.9 % (ref 11.5–15.5)
WBC: 15.5 10*3/uL — ABNORMAL HIGH (ref 4.0–10.5)
nRBC: 0 % (ref 0.0–0.2)

## 2021-07-12 LAB — I-STAT CHEM 8, ED
BUN: 19 mg/dL (ref 8–23)
Calcium, Ion: 1.03 mmol/L — ABNORMAL LOW (ref 1.15–1.40)
Chloride: 80 mmol/L — ABNORMAL LOW (ref 98–111)
Creatinine, Ser: 0.4 mg/dL — ABNORMAL LOW (ref 0.44–1.00)
Glucose, Bld: 88 mg/dL (ref 70–99)
HCT: 40 % (ref 36.0–46.0)
Hemoglobin: 13.6 g/dL (ref 12.0–15.0)
Potassium: 3.7 mmol/L (ref 3.5–5.1)
Sodium: 114 mmol/L — CL (ref 135–145)
TCO2: 29 mmol/L (ref 22–32)

## 2021-07-12 LAB — DIFFERENTIAL
Abs Immature Granulocytes: 0.59 10*3/uL — ABNORMAL HIGH (ref 0.00–0.07)
Basophils Absolute: 0 10*3/uL (ref 0.0–0.1)
Basophils Relative: 0 %
Eosinophils Absolute: 0.1 10*3/uL (ref 0.0–0.5)
Eosinophils Relative: 0 %
Immature Granulocytes: 4 %
Lymphocytes Relative: 9 %
Lymphs Abs: 1.4 10*3/uL (ref 0.7–4.0)
Monocytes Absolute: 1 10*3/uL (ref 0.1–1.0)
Monocytes Relative: 6 %
Neutro Abs: 12.5 10*3/uL — ABNORMAL HIGH (ref 1.7–7.7)
Neutrophils Relative %: 81 %

## 2021-07-12 LAB — C-REACTIVE PROTEIN: CRP: <0.5 mg/dL (ref <1.0)

## 2021-07-12 LAB — RESP PANEL BY RT-PCR (FLU A&B, COVID) ARPGX2
Influenza A by PCR: NEGATIVE
Influenza B by PCR: NEGATIVE
SARS Coronavirus 2 by RT PCR: NEGATIVE

## 2021-07-12 LAB — MAGNESIUM: Magnesium: 1.8 mg/dL (ref 1.7–2.4)

## 2021-07-12 LAB — URIC ACID: Uric Acid, Serum: 1.7 mg/dL — ABNORMAL LOW (ref 2.5–7.1)

## 2021-07-12 LAB — SODIUM
Sodium: 114 mmol/L — CL (ref 135–145)
Sodium: 116 mmol/L — CL (ref 135–145)

## 2021-07-12 LAB — CREATININE, URINE, RANDOM: Creatinine, Urine: 45.47 mg/dL

## 2021-07-12 LAB — OSMOLALITY: Osmolality: 239 mOsm/kg — CL (ref 275–295)

## 2021-07-12 LAB — BRAIN NATRIURETIC PEPTIDE: B Natriuretic Peptide: 204.3 pg/mL — ABNORMAL HIGH (ref 0.0–100.0)

## 2021-07-12 LAB — OSMOLALITY, URINE: Osmolality, Ur: 455 mOsm/kg (ref 300–900)

## 2021-07-12 LAB — SODIUM, URINE, RANDOM: Sodium, Ur: 77 mmol/L

## 2021-07-12 LAB — NA AND K (SODIUM & POTASSIUM), RAND UR
Potassium Urine: 36 mmol/L
Sodium, Ur: 78 mmol/L

## 2021-07-12 MED ORDER — ACETAMINOPHEN 325 MG PO TABS
650.0000 mg | ORAL_TABLET | Freq: Once | ORAL | Status: AC
  Administered 2021-07-12: 650 mg via ORAL
  Filled 2021-07-12: qty 2

## 2021-07-12 MED ORDER — LEVOTHYROXINE SODIUM 25 MCG PO TABS
125.0000 ug | ORAL_TABLET | ORAL | Status: DC
  Administered 2021-07-15 – 2021-07-16 (×2): 125 ug via ORAL
  Filled 2021-07-12 (×2): qty 1

## 2021-07-12 MED ORDER — SODIUM CHLORIDE 0.9 % IV SOLN
100.0000 mL/h | INTRAVENOUS | Status: DC
  Administered 2021-07-12: 100 mL/h via INTRAVENOUS

## 2021-07-12 MED ORDER — SODIUM CHLORIDE 0.9 % IV BOLUS
500.0000 mL | Freq: Once | INTRAVENOUS | Status: AC
  Administered 2021-07-12: 500 mL via INTRAVENOUS

## 2021-07-12 MED ORDER — ONDANSETRON HCL 4 MG PO TABS: 4.0000 mg | ORAL_TABLET | Freq: Four times a day (QID) | ORAL | Status: DC | PRN

## 2021-07-12 MED ORDER — SODIUM CHLORIDE 0.9 % IV BOLUS: 500.0000 mL | Freq: Once | INTRAVENOUS | Status: DC

## 2021-07-12 MED ORDER — DILTIAZEM HCL ER COATED BEADS 180 MG PO CP24
300.0000 mg | ORAL_CAPSULE | Freq: Every morning | ORAL | Status: DC
  Administered 2021-07-13: 300 mg via ORAL
  Filled 2021-07-12: qty 1

## 2021-07-12 MED ORDER — ACETAMINOPHEN 325 MG PO TABS
650.0000 mg | ORAL_TABLET | Freq: Four times a day (QID) | ORAL | Status: DC | PRN
  Administered 2021-07-13 – 2021-07-18 (×12): 650 mg via ORAL
  Filled 2021-07-12 (×12): qty 2

## 2021-07-12 MED ORDER — LEVOTHYROXINE SODIUM 25 MCG PO TABS: 125.0000 ug | ORAL_TABLET | ORAL | Status: DC

## 2021-07-12 MED ORDER — TRAMADOL HCL 50 MG PO TABS
50.0000 mg | ORAL_TABLET | Freq: Two times a day (BID) | ORAL | Status: DC | PRN
  Administered 2021-07-12 – 2021-07-18 (×7): 50 mg via ORAL
  Filled 2021-07-12 (×7): qty 1

## 2021-07-12 MED ORDER — HEPARIN SODIUM (PORCINE) 5000 UNIT/ML IJ SOLN
5000.0000 [IU] | Freq: Three times a day (TID) | INTRAMUSCULAR | Status: DC
  Administered 2021-07-12 – 2021-07-18 (×17): 5000 [IU] via SUBCUTANEOUS
  Filled 2021-07-12 (×19): qty 1

## 2021-07-12 MED ORDER — FUROSEMIDE 10 MG/ML IJ SOLN
20.0000 mg | Freq: Two times a day (BID) | INTRAMUSCULAR | Status: DC
  Administered 2021-07-12 – 2021-07-16 (×9): 20 mg via INTRAVENOUS
  Filled 2021-07-12 (×9): qty 2

## 2021-07-12 MED ORDER — PREDNISONE 5 MG PO TABS
5.0000 mg | ORAL_TABLET | Freq: Every day | ORAL | Status: AC
  Administered 2021-07-13 – 2021-07-15 (×3): 5 mg via ORAL
  Filled 2021-07-12 (×5): qty 1

## 2021-07-12 MED ORDER — BISACODYL 5 MG PO TBEC
5.0000 mg | DELAYED_RELEASE_TABLET | Freq: Every day | ORAL | Status: DC | PRN
  Administered 2021-07-13 – 2021-07-14 (×2): 5 mg via ORAL
  Filled 2021-07-12 (×3): qty 1

## 2021-07-12 MED ORDER — ACETAMINOPHEN 650 MG RE SUPP: 650.0000 mg | Freq: Four times a day (QID) | RECTAL | Status: DC | PRN

## 2021-07-12 MED ORDER — ONDANSETRON HCL 4 MG/2ML IJ SOLN
4.0000 mg | Freq: Four times a day (QID) | INTRAMUSCULAR | Status: DC | PRN
Start: 2021-07-12 — End: 2021-07-19

## 2021-07-12 MED ORDER — ROSUVASTATIN CALCIUM 20 MG PO TABS
20.0000 mg | ORAL_TABLET | Freq: Every day | ORAL | Status: DC
  Administered 2021-07-13 – 2021-07-18 (×6): 20 mg via ORAL
  Filled 2021-07-12 (×6): qty 1

## 2021-07-12 MED ORDER — PANTOPRAZOLE SODIUM 40 MG PO TBEC
40.0000 mg | DELAYED_RELEASE_TABLET | Freq: Every day | ORAL | Status: DC
Start: 2021-07-13 — End: 2021-07-19
  Administered 2021-07-13 – 2021-07-18 (×6): 40 mg via ORAL
  Filled 2021-07-12 (×6): qty 1

## 2021-07-12 NOTE — ED Triage Notes (Signed)
Pt arrived via [facility] from home for c/o intermittent AMS, increased general weakness and decreased mobility. Pt has non productive cough. Pt is A&Ox4 at present. ?

## 2021-07-12 NOTE — Evaluation (Addendum)
?Clinical/Bedside Swallow Evaluation ?Patient Details  ?Name: Lorraine Brandt ?MRN: [id number] ?Date of Birth: [birth year] ? ?Today's Date: 07/12/2021 ?Time: SLP Start Time (ACUTE ONLY): 3557 SLP Stop Time (ACUTE ONLY): 3220 ?SLP Time Calculation (min) (ACUTE ONLY): 18 min ? ?Past Medical History:  ?Past Medical History:  ?Diagnosis Date  ? Arthritis   ? oa  ? Cancer [facility])   ? basal cell skin cancer, multiple areas removed, thryoid  ? Central retinal artery occlusion 07/07/2019  ? DJD (degenerative joint disease)   ? Essential hypertension 07/07/2019  ? GERD (gastroesophageal reflux disease)   ? takes prilosec   ? Hypertension   ? Hypothyroidism since 2001  ? Osteoporosis   ? Polymyalgia rheumatica ([place])   ? Pure hypercholesterolemia 07/07/2019  ? Sleep apnea   ? uses mouthpiece somnodent, pt wishes to leave mouthpiece at home, pt told moderate severe sleep apnea  ? Thyroid disease   ? Vaginal delivery 1958, [id number]  ? ?Past Surgical History:  ?Past Surgical History:  ?Procedure Laterality Date  ? ANTERIOR AND POSTERIOR REPAIR N/A 11/02/2014  ? Procedure: ANTERIOR (CYSTOCELE) Repair AND Possible POSTERIOR REPAIR (RECTOCELE);  Surgeon: Princess Bruins, MD;  Location: [place] [facility];  Service: Gynecology;  Laterality: N/A;  ? BUBBLE STUDY  07/28/2019  ? Procedure: BUBBLE STUDY;  Surgeon: [name], MD;  Location: [place];  Service: Cardiovascular;;  ? CYSTOSCOPY N/A 11/02/2014  ? Procedure: CYSTOSCOPY;  Surgeon: Princess Bruins, MD;  Location: [place] [facility];  Service: Gynecology;  Laterality: N/A;  ? DILATION AND CURETTAGE OF UTERUS    ? EYE SURGERY  2007  ? both eyes lens replacements for cataracts  ? LAMINECTOMY    ? ROBOTIC ASSISTED LAPAROSCOPIC SACROCOLPOPEXY N/A 11/02/2014  ? Procedure: ROBOTIC ASSISTED LAPAROSCOPIC Uterosacral ligament suspension for SACROCOLPOPEXY;  Surgeon: Princess Bruins, MD;  Location: [place] [facility];  Service: Gynecology;  Laterality: N/A;  ? ROBOTIC ASSISTED TOTAL HYSTERECTOMY WITH BILATERAL  SALPINGO OOPHERECTOMY Bilateral 11/02/2014  ? Procedure: ROBOTIC ASSISTED TOTAL HYSTERECTOMY WITH BILATERAL SALPINGO OOPHORECTOMY;  Surgeon: Princess Bruins, MD;  Location: [facility];  Service: Gynecology;  Laterality: Bilateral;  ? THYROIDECTOMY  2001  ? and radiation done also, partial  ? TONSILLECTOMY    ? TOTAL KNEE ARTHROPLASTY Right 04/25/2014  ? Procedure: RIGHT TOTAL KNEE ARTHROPLASTY;  Surgeon: Gearlean Alf, MD;  Location: [facility];  Service: Orthopedics;  Laterality: Right;  ? TUBAL LIGATION    ? ?HPI:  ?Pt is an 88 y.o. female who presented with AMS. Per H&P, pt has been gradually getting weaker, has been diagnosed with C6-C7 fracture s/p fall and was placed in a C-collar by outpatient neurosurgery and started on a steroid taper. CXR on admission: Coarse asymmetric interstitial opacities left greater than right,suggesting progressive chronic interstitial lung disease. PMH: osteoarthritis, hypothyroidism, hypertension, polymyalgia rheumatica, dyslipidemia.  ?  ?Assessment / Plan / Recommendation  ?Clinical Impression ? Pt was seen for bedside swallow evaluation and she denied a history of dysphagia. Pt's daughter was present and she reported the the pt "gets strangled sometimes when she coughs", but she denied any symptoms of dysphagia with p.o. intake. Oral mechanism exam was [facility] and she presented with adequate, natural dentition. Pt was seen with meal tray which included rice, green beans, fish, a cookie, a roll, and a fruit cup. She tolerated all solids and liquids without signs or symptoms of oropharyngeal dysphagia. Pt's diet will be advanced to regular texture solids and thin liquids. SLP will follow briefly to ensure tolerance. ?SLP Visit Diagnosis: Dysphagia,  unspecified (R13.10) ?   ?Aspiration Risk ? No limitations  ?  ?Diet Recommendation Regular;Thin liquid  ? ?Liquid Administration via: Cup;Straw ?Medication Administration: Whole meds with liquid ?Postural Changes: Seated upright at 90 degrees   ?  ?Other  Recommendations Oral Care Recommendations: Oral care BID   ? ?Recommendations for follow up therapy are one component of a multi-disciplinary discharge planning process, led by the attending physician.  Recommendations may be updated based on patient status, additional functional criteria and insurance authorization. ? ?Follow up Recommendations No SLP follow up  ? ? ?  ?Assistance Recommended at Discharge    ?Functional Status Assessment    ?Frequency and Duration min 1 x/week  ?1 week ?  ?   ? ?Prognosis    ? ?  ? ?Swallow Study   ?General Date of Onset: 07/11/21 ?HPI: Pt is an 88 y.o. female who presented with AMS. Per H&P, pt has been gradually getting weaker, has been diagnosed with C6-C7 fracture s/p fall and was placed in a C-collar by outpatient neurosurgery and started on a steroid taper. CXR on admission: Coarse asymmetric interstitial opacities left greater than right,suggesting progressive chronic interstitial lung disease. PMH: osteoarthritis, hypothyroidism, hypertension, polymyalgia rheumatica, dyslipidemia. ?Type of Study: Bedside Swallow Evaluation ?Previous Swallow Assessment: none ?Diet Prior to this Study: Thin liquids;Dysphagia 3 (soft) ?Temperature Spikes Noted: No ?Respiratory Status: Room air ?History of Recent Intubation: No ?Behavior/Cognition: Alert;Cooperative;Pleasant mood ?Oral Cavity Assessment: Within Functional Limits ?Oral Care Completed by SLP: No ?Oral Cavity - Dentition: Adequate natural dentition ?Vision: Functional for self-feeding ?Self-Feeding Abilities: Able to feed self ?Patient Positioning: Upright in bed;Postural control adequate for testing ?Baseline Vocal Quality: Normal ?Volitional Cough: Strong ?Volitional Swallow: Able to elicit  ?  ?Oral/Motor/Sensory Function Overall Oral Motor/Sensory Function: Within functional limits   ?Ice Chips Ice chips: Not tested   ?Thin Liquid Thin Liquid: Within functional limits ?Presentation: Straw  ?  ?Nectar Thick Nectar  Thick Liquid: Not tested   ?Honey Thick Honey Thick Liquid: Not tested   ?Puree Puree: Not tested   ?Solid ? ? ?  Solid: Within functional limits ?Presentation: Self Fed  ? ?  ?Copeland Lapier I. Hardin Negus, [place], CCC-SLP ?Acute Rehabilitation Services ?Office number [phone] ?Pager [phone] ? ?Horton Marshall ?07/12/2021,3:55 PM ? ? ? ? ?

## 2021-07-12 NOTE — Consult Note (Signed)
Lorraine Brandt Admit Date: 07/12/2021 07/12/2021 Rexene Agent Requesting Physician:  Ronnie Derby MD  Reason for Consult:  Hyponatremia HPI:  20F presented to the ED overnight with progressive weakness over the past week, cough/dyspnea, and episodic confusion.    PMH Incudes: Hypertension on diltiazem and losartan; does not take diuretics History of thyroid cancer, longstanding hypothyroidism after resection OA especially of the lower back, only uses Tylenol PMR on chronic low-dose corticosteroids Anxiety/depression on SSRI Hyperlipidemia  In the past 2 weeks that she had 2 falls and did injure her neck, she had an evaluation in the ED and was diagnosed with a C6/C7 fracture and has been placed in a c-collar.  At baseline she is independent, no cognitive issues.    At the time of my evaluation she is fully coherent and able to provide a history.  She has been eating and drinking well.  She does not follow any specific diet.  She has not been purposefully trying to push her fluid intake.  No nausea/vomiting/diarrhea.  No significant edema noted.  Her cough and dyspnea is somewhat positional, worsened when she lies flat.  In the ER her sodium was 114, normal GFR, BUN 19.  Serum osmolality is not yet resulted.  Urine analysis has an osmolality of 455, sodium of 77.  Chest x-ray shows coarse asymmetric interstitial opacities, left greater than right, suggesting progressive chronic interstitial lung disease.  Superimposed infiltrates or asymmetric edema cannot be excluded.  ROS Balance of 12 systems is negative w/ exceptions as above  PMH  Past Medical History:  Diagnosis Date   Arthritis    oa   Cancer ([place])    basal cell skin cancer, multiple areas removed, thryoid   Central retinal artery occlusion 07/07/2019   DJD (degenerative joint disease)    Essential hypertension 07/07/2019   GERD (gastroesophageal reflux disease)    takes prilosec    Hypertension    Hypothyroidism since 2001    Osteoporosis    Polymyalgia rheumatica ([place])    Pure hypercholesterolemia 07/07/2019   Sleep apnea    uses mouthpiece somnodent, pt wishes to leave mouthpiece at home, pt told moderate severe sleep apnea   Thyroid disease    Vaginal delivery 1956, [id number]   [place]  Past Surgical History:  Procedure Laterality Date   ANTERIOR AND POSTERIOR REPAIR N/A 11/02/2014   Procedure: ANTERIOR (CYSTOCELE) Repair AND Possible POSTERIOR REPAIR (RECTOCELE);  Surgeon: Princess Bruins, MD;  Location: [place];  Service: Gynecology;  Laterality: N/A;   BUBBLE STUDY  07/28/2019   Procedure: BUBBLE STUDY;  Surgeon: [name], MD;  Location: [place];  Service: Cardiovascular;;   CYSTOSCOPY N/A 11/02/2014   Procedure: Consuela Mimes;  Surgeon: Princess Bruins, MD;  Location: [facility];  Service: Gynecology;  Laterality: N/A;   DILATION AND CURETTAGE OF UTERUS     EYE SURGERY  2007   both eyes lens replacements for cataracts   LAMINECTOMY     ROBOTIC ASSISTED LAPAROSCOPIC SACROCOLPOPEXY N/A 11/02/2014   Procedure: ROBOTIC ASSISTED LAPAROSCOPIC Uterosacral ligament suspension for SACROCOLPOPEXY;  Surgeon: Princess Bruins, MD;  Location: [place] [facility];  Service: Gynecology;  Laterality: N/A;   ROBOTIC ASSISTED TOTAL HYSTERECTOMY WITH BILATERAL SALPINGO OOPHERECTOMY Bilateral 11/02/2014   Procedure: ROBOTIC ASSISTED TOTAL HYSTERECTOMY WITH BILATERAL SALPINGO OOPHORECTOMY;  Surgeon: Princess Bruins, MD;  Location: [facility] [facility];  Service: Gynecology;  Laterality: Bilateral;   THYROIDECTOMY  2001   and radiation done also, partial   TONSILLECTOMY     TOTAL KNEE ARTHROPLASTY  Right 04/25/2014   Procedure: RIGHT TOTAL KNEE ARTHROPLASTY;  Surgeon: Gearlean Alf, MD;  Location: [facility];  Service: Orthopedics;  Laterality: Right;   TUBAL LIGATION     FH  Family History  Problem Relation Age of Onset   Breast cancer Daughter    SH  reports that she has never smoked. She has never used smokeless tobacco. She reports  current alcohol use of about 1.0 standard drink per week. She reports that she does not use drugs. Allergies  Allergies  Allergen Reactions   Oxycodone Other (See Comments)    Makes me crazy   Celebrex [Celecoxib] Itching and Rash    Had itching, burning and rash to it. Likely adverse effect rather than allergic.   Latex Rash   Home medications Prior to Admission medications   Medication Sig Start Date End Date Taking? Authorizing Provider  ACETAMINOPHEN PO Take 650-1,300 mg by mouth every 6 (six) hours as needed for moderate pain or headache.   Yes [provider]  Ascorbic Acid (VITAMIN C) 500 MG CAPS Take 500 mg by mouth daily.   Yes [provider]  CALCIUM-VITAMIN D PO Take 1 tablet by mouth in the morning and at bedtime.    Yes [provider]  Carboxymethylcellul-Glycerin (LUBRICATING EYE DROPS OP) Place 1 drop into both eyes daily as needed (dry eyes).   Yes [provider]  cholecalciferol (VITAMIN D3) 25 MCG (1000 UT) tablet Take 1,000 Units by mouth daily.   Yes [provider]  diltiazem (CARDIZEM CD) 300 MG 24 hr capsule Take 300 mg by mouth every morning.    Yes [provider]  docusate sodium (COLACE) 100 MG capsule Take 100 mg by mouth daily as needed for mild constipation.   Yes [provider]  escitalopram (LEXAPRO) 5 MG tablet Take 5 mg by mouth daily. 04/01/19  Yes [provider]  levothyroxine (SYNTHROID, LEVOTHROID) 125 MCG tablet Take 125 mcg by mouth every Monday, Tuesday, Wednesday, Thursday, and Friday. On an empty stomach.   Yes [provider]  losartan (COZAAR) 100 MG tablet Take 100 mg by mouth daily.   Yes [provider]  Omega-3 Fatty Acids (FISH OIL PO) Take 2,000 mg by mouth daily.   Yes [provider]  omeprazole (PRILOSEC) 20 MG capsule Take 20 mg by mouth every morning.    Yes [provider]  predniSONE (DELTASONE) 5 MG tablet Take 15 mg by  mouth daily with breakfast. Has epidural on 07-16-21   Yes [provider]  rosuvastatin (CRESTOR) 20 MG tablet Take 1 tablet (20 mg total) by mouth daily. 07/07/19 07/03/22 Yes Skeet Latch, MD    Current Medications Scheduled Meds:  [START ON 07/13/2021] diltiazem  300 mg Oral q morning   heparin  5,000 Units Subcutaneous Q8H   levothyroxine  125 mcg Oral Q MTWThF   [START ON 07/13/2021] pantoprazole  40 mg Oral Daily   [START ON 07/13/2021] predniSONE  5 mg Oral Q breakfast   [START ON 07/13/2021] rosuvastatin  20 mg Oral Daily   Continuous Infusions: PRN Meds:.acetaminophen **OR** acetaminophen, bisacodyl, ondansetron **OR** ondansetron (ZOFRAN) IV, traMADol  CBC Recent Labs  Lab 07/12/21 0945 07/12/21 1005  WBC 15.5*  --   NEUTROABS 12.5*  --   HGB 13.5 13.6  HCT 36.5 40.0  MCV 89.5  --   PLT 268  --    Basic Metabolic Panel Recent Labs  Lab 07/12/21 0945 07/12/21 1005  NA 114*  114*  K 3.8 3.7  CL 82* 80*  CO2 24  --   GLUCOSE 86 88  BUN 16 19  CREATININE 0.46 0.40*  CALCIUM 7.9*  --     Physical Exam   Blood pressure 136/84, pulse 69, temperature 98.3 F (36.8 C), temperature source Oral, resp. rate 15, height 5\' 1"  (1.549 m), weight 56.7 kg, SpO2 97 %. GEN: Elderly female, NAD ENT: NCAT, in c-collar EYES: EOMI CV: Regular, normal S1 and S2 PULM: Rhonchorous/crackles in the bases, normal work of breathing ABD: Soft, nontender, no masses SKIN: No rashes or lesions EXT: No edema  Assessment 68F with new finding of hyponatremia.  Very likely this is the cause of her subacute weakness and episodic confusion.  Serum osmolality pending, but anticipate it will be low.  Urine osmolality is inappropriately elevated, urine sodium is also elevated.  This pattern is most consistent with SIADH which is probably triggered by her recent acute health events.  Some concern also for CHF symptomatology, and that might be contributing but I think the primary issue is  SIADH.  New hyponatremia, moderate, likely causing some symptoms prior to presentation including global weakness and episodic confusion, currently neurologically intact Suspect SIADH with urine osmolality of 455, sodium of 77 Serum osmolality pending No indication for hypertonic saline 1 L total fluid restriction daily Plan for fluid restriction, every 4 hours serum sodium We will add on low-dose Lasix 20 mg twice daily Agree with holding SSRI SOB / Cough: ? Acute CHF, TTE pending, Would expect UNa lower if CHF was primary driver of #1 Chronic Pain OA Hypothyroidism longstanding PMR on chronic corticoseriods  Plan As above   Rexene Agent  07/12/2021, 1:38 PM

## 2021-07-12 NOTE — ED Provider Notes (Signed)
[facility] EMERGENCY DEPARTMENT Provider Note   CSN: [id number] Arrival date & time: 07/12/21  4742     History  Chief Complaint  Patient presents with   Altered Mental Status    Lorraine Brandt is a 88 y.o. female.   Altered Mental Status Associated symptoms: no fever    Patient presents to the ED for evaluation of weakness cough and intermittent confusion.  Patient has history of hypertension, arthritis, polymyalgia rheumatica, thyroid disease, osteoporosis.  Patient was recently seen in the emergency room on February 22 as well as February 23 after a fall.  Patient first slipped and fell when she was wearing socks on the 22nd.  Then fell walking out of a car on the 23rd.  Patient was diagnosed with a nondisplaced fracture of the sixth cervical vertebrae.  She was discharged home with a c-collar.  She has subsequently followed up with [place] [facility].  Previous records from those visits were reviewed.  Patient also had an MRI of her cervical spine on March 1.  She was noted to have foraminal stenosis and edema at the site of her endplate and spinous process fracture. Patient's daughter is here with the patient today.  Over the last couple of weeks she has had increasing weakness.  Patient is having more difficulty using her walker and is shuffling.  She is also having some intermittent confusion.  Patient had been living in the independent facility section but is close to moving [facility]  Daughter is concerned about her increasing weakness.  She is having a lot of difficulty even with her walker.  She is concerned that there could be something else going on.  Patient states she also has been coughing a lot.  She has not been able to sleep well above the cause of her cough.  She is also had some urinary frequency and urgency Home Medications Prior to Admission medications   Medication Sig Start Date End Date Taking?  Authorizing Provider  ACETAMINOPHEN PO Take 650-1,300 mg by mouth every 6 (six) hours as needed for moderate pain or headache.   Yes [provider]  Ascorbic Acid (VITAMIN C) 500 MG CAPS Take 500 mg by mouth daily.   Yes [provider]  CALCIUM-VITAMIN D PO Take 1 tablet by mouth in the morning and at bedtime.    Yes [provider]  Carboxymethylcellul-Glycerin (LUBRICATING EYE DROPS OP) Place 1 drop into both eyes daily as needed (dry eyes).   Yes [provider]  cholecalciferol (VITAMIN D3) 25 MCG (1000 UT) tablet Take 1,000 Units by mouth daily.   Yes [provider]  diltiazem (CARDIZEM CD) 300 MG 24 hr capsule Take 300 mg by mouth every morning.    Yes [provider]  docusate sodium (COLACE) 100 MG capsule Take 100 mg by mouth daily as needed for mild constipation.   Yes [provider]  escitalopram (LEXAPRO) 5 MG tablet Take 5 mg by mouth daily. 04/01/19  Yes [provider]  levothyroxine (SYNTHROID, LEVOTHROID) 125 MCG tablet Take 125 mcg by mouth every Monday, Tuesday, Wednesday, Thursday, and Friday. On an empty stomach.   Yes [provider]  losartan (COZAAR) 100 MG tablet Take 100 mg by mouth daily.   Yes [provider]  Omega-3 Fatty Acids (FISH OIL PO) Take 2,000 mg by mouth daily.   Yes [provider]  omeprazole (PRILOSEC) 20 MG capsule Take 20 mg by  mouth every morning.    Yes [provider]  predniSONE (DELTASONE) 5 MG tablet Take 15 mg by mouth daily with breakfast. Has epidural on 07-16-21   Yes [provider]  rosuvastatin (CRESTOR) 20 MG tablet Take 1 tablet (20 mg total) by mouth daily. 07/07/19 07/03/22 Yes Skeet Latch, MD      Allergies    Oxycodone, Celebrex [celecoxib], and Latex    Review of Systems   Review of Systems  Constitutional:  Negative for fever.   Physical Exam Updated Vital Signs BP 119/69    Pulse 75    Temp 98.3 F  (36.8 C) (Oral)    Resp 17    Ht 1.549 m (5\' 1" )    Wt 56.7 kg    SpO2 96%    BMI 23.62 kg/m  Physical Exam Vitals and nursing note reviewed.  Constitutional:      General: She is not in acute distress.    Appearance: She is well-developed.  HENT:     Head: Normocephalic and atraumatic.     Right Ear: External ear normal.     Left Ear: External ear normal.  Eyes:     General: No scleral icterus.       Right eye: No discharge.        Left eye: No discharge.     Conjunctiva/sclera: Conjunctivae normal.  Neck:     Trachea: No tracheal deviation.  Cardiovascular:     Rate and Rhythm: Normal rate and regular rhythm.  Pulmonary:     Effort: Pulmonary effort is normal. No respiratory distress.     Breath sounds: Normal breath sounds. No stridor. No wheezing or rales.  Abdominal:     General: Bowel sounds are normal. There is no distension.     Palpations: Abdomen is soft.     Tenderness: There is no abdominal tenderness. There is no guarding or rebound.  Musculoskeletal:        General: No tenderness or deformity.     Cervical back: Neck supple.  Skin:    General: Skin is warm and dry.     Findings: No rash.  Neurological:     General: No focal deficit present.     Mental Status: She is alert and oriented to person, place, and time.     Cranial Nerves: No cranial nerve deficit (no facial droop, extraocular movements intact, no slurred speech).     Sensory: No sensory deficit.     Motor: No abnormal muscle tone or seizure activity.     Coordination: Coordination normal.     Comments: No pronator drift bilateral upper extrem, able to hold both legs off bed for 5 seconds, sensation intact in all extremities, no visual field cuts, no left or right sided neglect, normal finger-nose exam bilaterally, no nystagmus noted  No facial droop, extraocular movements intact, tongue midline  Psychiatric:        Mood and Affect: Mood normal.    ED Results / Procedures / Treatments    Labs (all labs ordered are listed, but only abnormal results are displayed) Labs Reviewed  CBC - Abnormal; Notable for the following components:      Result Value   WBC 15.5 (*)    MCHC 37.0 (*)    All other components within normal limits  DIFFERENTIAL - Abnormal; Notable for the following components:   Neutro Abs 12.5 (*)    Abs Immature Granulocytes 0.59 (*)    All other components within normal limits  COMPREHENSIVE METABOLIC PANEL - Abnormal; Notable for the following components:   Sodium 114 (*)    Chloride 82 (*)    Calcium 7.9 (*)    Total Protein 5.3 (*)    Albumin 3.3 (*)    Total Bilirubin 1.3 (*)    All other components within normal limits  I-STAT CHEM 8, ED - Abnormal; Notable for the following components:   Sodium 114 (*)    Chloride 80 (*)    Creatinine, Ser 0.40 (*)    Calcium, Ion 1.03 (*)    All other components within normal limits  RESP PANEL BY RT-PCR (FLU A&B, COVID) [id number]  URINALYSIS, ROUTINE W REFLEX MICROSCOPIC  NA AND K (SODIUM & POTASSIUM), RAND UR    EKG EKG Interpretation  Date/Time:  Friday July 12 2021 09:21:45 EST Ventricular Rate:  73 PR Interval:  154 QRS Duration: 81 QT Interval:  341 QTC Calculation: 376 R Axis:   -29 Text Interpretation: sinus rhythm Atrial premature complexes Abnormal R-wave progression, early transition Left ventricular hypertrophy Confirmed by Dorie Rank [phone]) on 07/12/2021 9:43:01 AM  Radiology DG Chest 2 View  Result Date: 07/12/2021 CLINICAL DATA:  Pt brought from home for c/o intermittent AMS, increased general weakness and decreased mobility. Pt has non productive cough. Pt denies any CP but states she has been feeling SOB x a few days Nonsmoker Hx of HTN, cancer, GERD, EXAM: CHEST - 2 VIEW COMPARISON:  04/19/2014 FINDINGS: Progressive development of coarse peripheral interstitial opacities, worse left than right, involving bases more than apices. Heart size upper limits normal.  Tortuous ectatic thoracic  aorta. No effusion. Levoscoliosis in the upper thoracic spine with suspected vertebral segmentation anomaly, incompletely characterized. IMPRESSION: 1. Coarse asymmetric interstitial opacities left greater than right, suggesting progressive chronic interstitial lung disease. Cannot exclude superimposed interstitial infiltrate or asymmetric edema. Electronically Signed   By: Lucrezia Europe M.D.   On: 07/12/2021 10:10   MR CERVICAL SPINE WO CONTRAST  Result Date: 07/10/2021 CLINICAL DATA:  Cervicalgia EXAM: MRI CERVICAL SPINE WITHOUT CONTRAST TECHNIQUE: Multiplanar, multisequence MR imaging of the cervical spine was performed. No intravenous contrast was administered. COMPARISON:  Cervical spine CT 07/04/2021 FINDINGS: Alignment: Segmentation anomalies at the cervicothoracic junction contribute to severe levoscoliosis of the upper thoracic spine. Grade 1 anterolisthesis at C3-4 and C4-5. Vertebrae: There is edema at the superior C7 endplate in the region of the fracture identified on the earlier CT. There is also edema at the site of the C6 spinous process fracture. Assessment of the bones is otherwise limited by the degree of motion. Cord: Assessment severely limited by motion. Posterior Fossa, vertebral arteries, paraspinal tissues: Negative. Disc levels: C1-2: Unremarkable. C2-3: No disc herniation. There is no spinal canal stenosis. No neural foraminal stenosis. C3-4: Facet arthrosis, left-greater-than-right, and left foraminal disc protrusion. There is no spinal canal stenosis. Severe left neural foraminal stenosis. C4-5: Severe left facet arthrosis. There is no spinal canal stenosis. Severe left neural foraminal stenosis. C5-6: No disc herniation, spinal canal stenosis or neural impingement C6-7: No disc herniation, spinal canal stenosis or neural impingement . C7-T1: No disc herniation, spinal canal stenosis or neural impingement IMPRESSION: 1. Severely motion degraded study. 2. Edema at the site of the superior  C7 endplate fracture and C6 spinous process fracture. 3. Severe left C3-4 and C4-5 neural foraminal stenosis secondary to combination of facet arthrosis and left foraminal disc protrusion. 4. Cervicothoracic segmentation anomalies with marked scoliosis. Electronically Signed   By: Cletus Gash.D.  On: 07/10/2021 21:34    Procedures Procedures    Medications Ordered in ED Medications  sodium chloride 0.9 % bolus 500 mL (500 mLs Intravenous New Bag/Given 07/12/21 1000)    Followed by  0.9 %  sodium chloride infusion (100 mL/hr Intravenous New Bag/Given 07/12/21 1000)    ED Course/ Medical Decision Making/ A&P Clinical Course as of 07/12/21 1127  Fri Jul 12, 2021  0940 No oxygen requirement at this time.  Vital signs notable for hypertension.  We will check for metabolic causes for her weakness.  We will also assess for pneumonia with her coughing.  With her increasing difficulty walking and the intermittent confusion we will proceed with MRI to rule out occult stroke [JK]  1104 Urinalysis, Routine w reflex microscopic Urine, In & Out Cath normal [JK]  1104 Comprehensive metabolic panel(!!) Na level severely decreased.  Would account for the intermittent confusion and weakness.  Without focal neuro deficit will hold off on mri. [JK]  1105 CBC(!) Wbc elevated [JK]  1105 DG Chest 2 View Chest x-ray images and radiology report reviewed no definite acute findings noted on chest x-ray.  Progressive chronic findings [JK]    Clinical Course User Index [JK] Dorie Rank, MD                           Medical Decision Making Amount and/or Complexity of Data Reviewed Labs: ordered. Decision-making details documented in ED Course. Radiology: ordered. Decision-making details documented in ED Course.  Risk OTC drugs. Prescription drug management. Decision regarding hospitalization.   Patient presented to the ED for evaluation intermittent confusion and weakness.  Patient's also had some recent  nonproductive cough.  No focal neurologic deficits noted on exam.  In the ED the patient is currently alert and awake, answering questions appropriately.  Labs notable for significant hyponatremia.  Patient states she has a history of hyponatremia however previous labs on file showed sodium levels in the 130s.  No clear etiology for her hyponatremia.  Does not sound like she has had definite excessive water intake.  Is possible patient may have SIADH.  We will add on urine electrolytes.  Patient is currently alert and oriented.  Not confused.  I do not feel that hypertonic saline is necessary at this time.  We will proceed with normal saline infusion and try to slowly correct.  I will consult the medical service for admission.  Case discussed with Dr Candiss Norse       Final Clinical Impression(s) / ED Diagnoses Final diagnoses:  Hyponatremia     Dorie Rank, MD 07/14/21 [phone]

## 2021-07-12 NOTE — H&P (Signed)
TRH H&P   Patient Demographics:    Jaya Lapka, is a 88 y.o. female  MRN: [id number]   DOB - 1933-10-07  Admit Date - 07/12/2021  Outpatient Primary MD for the patient is Reynold Bowen, MD  Chief Complaint  Patient presents with   Altered Mental Status      HPI:    Mitsuye Schrodt  is a 88 y.o. female, with history of osteoarthritis, hypothyroidism, hypertension, polymyalgia rheumatica, dyslipidemia who has been gradually getting weaker over the last few days, also has developed problems laying flat as she gets short of breath, she sustained 2 falls over the last 2 weeks and one of the falls she hurt her neck, she was diagnosed with C6-C7 fracture she saw neurosurgery outpatient and was placed in a c-collar.  She was also placed on a steroid taper.  She continues to get weak and presented to the ER today where she was found to have severe hyponatremia.  In my exam she has bilateral crackles and history positive for orthopnea I think she has CHF causing hyponatremia, there could be some element of SIADH as well due to her new onset of pain but clearly she has fluid overload on exam.  Currently besides generalized weakness and neck pain her only other complaint is orthopnea and a dry cough.  She has no focal weakness.  No chest pain or headache.  No abdominal pain, no diarrhea or dysuria.  No nausea vomiting.    Review of systems:     A full 10 point Review of Systems was done, except as stated above, all other Review of Systems were negative.   With Past History of the following :    Past Medical History:  Diagnosis Date   Arthritis    oa   Cancer ([place])    basal cell skin cancer, multiple areas removed, thryoid    Central retinal artery occlusion 07/07/2019   DJD (degenerative joint disease)    Essential hypertension 07/07/2019   GERD (gastroesophageal reflux disease)    takes prilosec    Hypertension    Hypothyroidism since 2001   Osteoporosis    Polymyalgia rheumatica ([place])    Pure hypercholesterolemia 07/07/2019   Sleep apnea    uses mouthpiece somnodent, pt wishes to leave mouthpiece at home, pt told moderate severe sleep apnea  Thyroid disease    Vaginal delivery 1958, [id number]      Past Surgical History:  Procedure Laterality Date   ANTERIOR AND POSTERIOR REPAIR N/A 11/02/2014   Procedure: ANTERIOR (CYSTOCELE) Repair AND Possible POSTERIOR REPAIR (RECTOCELE);  Surgeon: Princess Bruins, MD;  Location: [facility];  Service: Gynecology;  Laterality: N/A;   BUBBLE STUDY  07/28/2019   Procedure: BUBBLE STUDY;  Surgeon: [name], MD;  Location: [place];  Service: Cardiovascular;;   CYSTOSCOPY N/A 11/02/2014   Procedure: Consuela Mimes;  Surgeon: Princess Bruins, MD;  Location: [facility];  Service: Gynecology;  Laterality: N/A;   DILATION AND CURETTAGE OF UTERUS     EYE SURGERY  2007   both eyes lens replacements for cataracts   LAMINECTOMY     ROBOTIC ASSISTED LAPAROSCOPIC SACROCOLPOPEXY N/A 11/02/2014   Procedure: ROBOTIC ASSISTED LAPAROSCOPIC Uterosacral ligament suspension for SACROCOLPOPEXY;  Surgeon: Princess Bruins, MD;  Location: [facility];  Service: Gynecology;  Laterality: N/A;   ROBOTIC ASSISTED TOTAL HYSTERECTOMY WITH BILATERAL SALPINGO OOPHERECTOMY Bilateral 11/02/2014   Procedure: ROBOTIC ASSISTED TOTAL HYSTERECTOMY WITH BILATERAL SALPINGO OOPHORECTOMY;  Surgeon: Princess Bruins, MD;  Location: [facility];  Service: Gynecology;  Laterality: Bilateral;   THYROIDECTOMY  2001   and radiation done also, partial   TONSILLECTOMY     TOTAL KNEE ARTHROPLASTY Right 04/25/2014   Procedure: RIGHT TOTAL KNEE ARTHROPLASTY;  Surgeon: Gearlean Alf, MD;  Location: [facility];  Service:  Orthopedics;  Laterality: Right;   TUBAL LIGATION        Social History:     Social History   Tobacco Use   Smoking status: Never   Smokeless tobacco: Never  Substance Use Topics   Alcohol use: Yes    Alcohol/week: 1.0 standard drink    Types: 1 Glasses of wine per week    Comment: wine 4 ounces most days        Family History :     Family History  Problem Relation Age of Onset   Breast cancer Daughter        Home Medications:   Prior to Admission medications   Medication Sig Start Date End Date Taking? Authorizing Provider  ACETAMINOPHEN PO Take 650-1,300 mg by mouth every 6 (six) hours as needed for moderate pain or headache.   Yes [provider]  Ascorbic Acid (VITAMIN C) 500 MG CAPS Take 500 mg by mouth daily.   Yes [provider]  CALCIUM-VITAMIN D PO Take 1 tablet by mouth in the morning and at bedtime.    Yes [provider]  Carboxymethylcellul-Glycerin (LUBRICATING EYE DROPS OP) Place 1 drop into both eyes daily as needed (dry eyes).   Yes [provider]  cholecalciferol (VITAMIN D3) 25 MCG (1000 UT) tablet Take 1,000 Units by mouth daily.   Yes [provider]  diltiazem (CARDIZEM CD) 300 MG 24 hr capsule Take 300 mg by mouth every morning.    Yes [provider]  docusate sodium (COLACE) 100 MG capsule Take 100 mg by mouth daily as needed for mild constipation.   Yes [provider]  escitalopram (LEXAPRO) 5 MG tablet Take 5 mg by mouth daily. 04/01/19  Yes [provider]  levothyroxine (SYNTHROID, LEVOTHROID) 125 MCG tablet Take 125 mcg by mouth every Monday, Tuesday, Wednesday, Thursday, and Friday. On an empty stomach.   Yes [provider]  losartan (COZAAR) 100 MG tablet Take 100 mg by mouth daily.   Yes [provider]  Omega-3 Fatty Acids ([place]  OIL PO) Take 2,000 mg by mouth daily.   Yes [provider]  omeprazole (PRILOSEC) 20 MG capsule Take 20 mg by  mouth every morning.    Yes [provider]  predniSONE (DELTASONE) 5 MG tablet Take 15 mg by mouth daily with breakfast. Has epidural on 07-16-21   Yes [provider]  rosuvastatin (CRESTOR) 20 MG tablet Take 1 tablet (20 mg total) by mouth daily. 07/07/19 07/03/22 Yes Skeet Latch, MD     Allergies:     Allergies  Allergen Reactions   Oxycodone Other (See Comments)    Makes me crazy   Celebrex [Celecoxib] Itching and Rash    Had itching, burning and rash to it. Likely adverse effect rather than allergic.   Latex Rash     Physical Exam:   Vitals  Blood pressure 112/63, pulse (!) 55, temperature 98.3 F (36.8 C), temperature source Oral, resp. rate 20, height 5\' 1"  (1.549 m), weight 56.7 kg, SpO2 97 %.   1. General - frail elderly lady sitting in hospital bed in no apparent distress  2. Normal affect and insight, Not Suicidal or Homicidal, Awake Alert,   3. No F.N deficits, ALL C.Nerves Intact, Strength 5/5 all 4 extremities, Sensation intact all 4 extremities, Plantars down going.  4. Ears and Eyes appear Normal, Conjunctivae clear, PERRLA. Moist Oral Mucosa.  5.  She is wearing a c-collar,    6. Symmetrical Chest wall movement, Good air movement bilaterally, CTAB.  Coarse bilateral crackles,  7. RRR, No Gallops, Rubs or Murmurs, No Parasternal Heave.  8. Positive Bowel Sounds, Abdomen Soft, No tenderness, No organomegaly appriciated,No rebound -guarding or rigidity.  9.  No Cyanosis, Normal Skin Turgor, No Skin Rash or Bruise.  10. Good muscle tone,  joints appear normal , no effusions, Normal ROM.  11. No Palpable Lymph Nodes in Neck or Axillae      Data Review:    CBC Recent Labs  Lab 07/12/21 0945 07/12/21 1005  WBC 15.5*  --   HGB 13.5 13.6  HCT 36.5 40.0  PLT 268  --   MCV 89.5  --   MCH 33.1  --   MCHC 37.0*  --   RDW 14.9  --   LYMPHSABS 1.4  --   MONOABS 1.0  --   EOSABS 0.1  --   BASOSABS 0.0  --     ------------------------------------------------------------------------------------------------------------------  Chemistries  Recent Labs  Lab 07/12/21 0945 07/12/21 1005  NA 114* 114*  K 3.8 3.7  CL 82* 80*  CO2 24  --   GLUCOSE 86 88  BUN 16 19  CREATININE 0.46 0.40*  CALCIUM 7.9*  --   AST 27  --   ALT 25  --   ALKPHOS 73  --   BILITOT 1.3*  --    ------------------------------------------------------------------------------------------------------------------ estimated creatinine clearance is 38.8 mL/min (A) (by C-G formula based on SCr of 0.4 mg/dL (L)). ------------------------------------------------------------------------------------------------------------------ No results for input(s): TSH, T4TOTAL, T3FREE, THYROIDAB in the last 72 hours.  Invalid input(s): FREET3  Coagulation profile No results for input(s): INR, PROTIME in the last 168 hours. ------------------------------------------------------------------------------------------------------------------- No results for input(s): DDIMER in the last 72 hours. -------------------------------------------------------------------------------------------------------------------  Cardiac Enzymes No results for input(s): CKMB, TROPONINI, MYOGLOBIN in the last 168 hours.  Invalid input(s): CK ------------------------------------------------------------------------------------------------------------------ No results found for: BNP   ---------------------------------------------------------------------------------------------------------------  Urinalysis    Component Value Date/Time   COLORURINE YELLOW 07/12/2021 1005   APPEARANCEUR CLEAR 07/12/2021 1005   LABSPEC 1.015  07/12/2021 1005   PHURINE 7.0 07/12/2021 1005   GLUCOSEU NEGATIVE 07/12/2021 1005   HGBUR NEGATIVE 07/12/2021 1005   BILIRUBINUR NEGATIVE 07/12/2021 1005   KETONESUR NEGATIVE 07/12/2021 1005   PROTEINUR NEGATIVE 07/12/2021 1005    UROBILINOGEN 0.2 04/26/2014 2124   NITRITE NEGATIVE 07/12/2021 1005   LEUKOCYTESUR NEGATIVE 07/12/2021 1005    ----------------------------------------------------------------------------------------------------------------   Imaging Results:    DG Chest 2 View  Result Date: 07/12/2021 CLINICAL DATA:  Pt brought from home for c/o intermittent AMS, increased general weakness and decreased mobility. Pt has non productive cough. Pt denies any CP but states she has been feeling SOB x a few days Nonsmoker Hx of HTN, cancer, GERD, EXAM: CHEST - 2 VIEW COMPARISON:  04/19/2014 FINDINGS: Progressive development of coarse peripheral interstitial opacities, worse left than right, involving bases more than apices. Heart size upper limits normal.  Tortuous ectatic thoracic aorta. No effusion. Levoscoliosis in the upper thoracic spine with suspected vertebral segmentation anomaly, incompletely characterized. IMPRESSION: 1. Coarse asymmetric interstitial opacities left greater than right, suggesting progressive chronic interstitial lung disease. Cannot exclude superimposed interstitial infiltrate or asymmetric edema. Electronically Signed   By: Lucrezia Europe M.D.   On: 07/12/2021 10:10   MR CERVICAL SPINE WO CONTRAST  Result Date: 07/10/2021 CLINICAL DATA:  Cervicalgia EXAM: MRI CERVICAL SPINE WITHOUT CONTRAST TECHNIQUE: Multiplanar, multisequence MR imaging of the cervical spine was performed. No intravenous contrast was administered. COMPARISON:  Cervical spine CT 07/04/2021 FINDINGS: Alignment: Segmentation anomalies at the cervicothoracic junction contribute to severe levoscoliosis of the upper thoracic spine. Grade 1 anterolisthesis at C3-4 and C4-5. Vertebrae: There is edema at the superior C7 endplate in the region of the fracture identified on the earlier CT. There is also edema at the site of the C6 spinous process fracture. Assessment of the bones is otherwise limited by the degree of motion. Cord: Assessment  severely limited by motion. Posterior Fossa, vertebral arteries, paraspinal tissues: Negative. Disc levels: C1-2: Unremarkable. C2-3: No disc herniation. There is no spinal canal stenosis. No neural foraminal stenosis. C3-4: Facet arthrosis, left-greater-than-right, and left foraminal disc protrusion. There is no spinal canal stenosis. Severe left neural foraminal stenosis. C4-5: Severe left facet arthrosis. There is no spinal canal stenosis. Severe left neural foraminal stenosis. C5-6: No disc herniation, spinal canal stenosis or neural impingement C6-7: No disc herniation, spinal canal stenosis or neural impingement . C7-T1: No disc herniation, spinal canal stenosis or neural impingement IMPRESSION: 1. Severely motion degraded study. 2. Edema at the site of the superior C7 endplate fracture and C6 spinous process fracture. 3. Severe left C3-4 and C4-5 neural foraminal stenosis secondary to combination of facet arthrosis and left foraminal disc protrusion. 4. Cervicothoracic segmentation anomalies with marked scoliosis. Electronically Signed   By: Ulyses Jarred M.D.   On: 07/10/2021 21:34    My personal review of EKG: Rhythm NSR, Rate  57 /min,   no Acute ST changes   Assessment & Plan:    1.  Generalized weakness, falls at home due to severe hyponatremia.  Patient appears quite frail and deconditioned, due to her falls she has sustained a C6-C7 fracture as well, her hyponatremia itself seems to be due to fluid overload and acute on chronic CHF new diagnosis.  Urine osmolality, serum osmolality along with uric acid have been ordered, will place on fluid restriction, nephrology to evaluate.  I think she will benefit from Lasix but this will defer to nephrology.  We will commence PT OT and she may require  placement.  Will hold Lexapro.   2.  Acute on chronic diastolic CHF last EF 09%.  Check BNP and echocardiogram she has crackles on exam, orthopnea history for 2 weeks, fluid restriction, diuresis per  nephrology due to severe hyponatremia  3.  Recent fall at home with C7 endplate and C6 vertebral body fracture.  She has seen neurosurgery in the outpatient setting currently had a c-collar which she has to wear at all times till she sees neurosurgery and they take it off.  She is finishing her prednisone taper in the next 4 days.  4.  Hypertension.  For now low-dose Coreg and monitor we will avoid ACE and ARB till fluid status is stabilized.  5.  Osteoarthritis.  Supportive care.  6.  Dyslipidemia.  On statin.  7.  Depression.  Will hold Lexapro DM levels stabilize.   DVT Prophylaxis Heparin   AM Labs Ordered, also please review Full Orders  Family Communication: Admission, patients condition and plan of care including tests being ordered have been discussed with the patient and daughter who indicate understanding and agree with the plan and Code Status.  Code Status Full  Likely DC to  TBD  Condition Fair  Consults called: Renal    Admission status: Inpt    Time spent in minutes : 35   Lala Lund M.D on 07/12/2021 at 12:36 PM  To page go to [email or web address] - password [facility]

## 2021-07-12 NOTE — ED Notes (Addendum)
a 

## 2021-07-12 NOTE — ED Notes (Signed)
Patient transported to X-ray 

## 2021-07-13 ENCOUNTER — Inpatient Hospital Stay (HOSPITAL_COMMUNITY): Payer: Medicare Other

## 2021-07-13 DIAGNOSIS — E871 Hypo-osmolality and hyponatremia: Secondary | ICD-10-CM | POA: Diagnosis not present

## 2021-07-13 LAB — COMPREHENSIVE METABOLIC PANEL
ALT: 25 U/L (ref 0–44)
AST: 19 U/L (ref 15–41)
Albumin: 2.9 g/dL — ABNORMAL LOW (ref 3.5–5.0)
Alkaline Phosphatase: 79 U/L (ref 38–126)
Anion gap: 6 (ref 5–15)
BUN: 12 mg/dL (ref 8–23)
CO2: 27 mmol/L (ref 22–32)
Calcium: 7.7 mg/dL — ABNORMAL LOW (ref 8.9–10.3)
Chloride: 83 mmol/L — ABNORMAL LOW (ref 98–111)
Creatinine, Ser: 0.4 mg/dL — ABNORMAL LOW (ref 0.44–1.00)
GFR, Estimated: >60 mL/min (ref >60)
Glucose, Bld: 91 mg/dL (ref 70–99)
Potassium: 3.2 mmol/L — ABNORMAL LOW (ref 3.5–5.1)
Sodium: 116 mmol/L — CL (ref 135–145)
Total Bilirubin: 1 mg/dL (ref 0.3–1.2)
Total Protein: 4.9 g/dL — ABNORMAL LOW (ref 6.5–8.1)

## 2021-07-13 LAB — CBC WITH DIFFERENTIAL/PLATELET
Abs Immature Granulocytes: 0.5 10*3/uL — ABNORMAL HIGH (ref 0.00–0.07)
Basophils Absolute: 0 10*3/uL (ref 0.0–0.1)
Basophils Relative: 0 %
Eosinophils Absolute: 0.1 10*3/uL (ref 0.0–0.5)
Eosinophils Relative: 1 %
HCT: 33.9 % — ABNORMAL LOW (ref 36.0–46.0)
Hemoglobin: 12.6 g/dL (ref 12.0–15.0)
Immature Granulocytes: 4 %
Lymphocytes Relative: 8 %
Lymphs Abs: 1.2 10*3/uL (ref 0.7–4.0)
MCH: 33.2 pg (ref 26.0–34.0)
MCHC: 37.2 g/dL — ABNORMAL HIGH (ref 30.0–36.0)
MCV: 89.4 fL (ref 80.0–100.0)
Monocytes Absolute: 0.6 10*3/uL (ref 0.1–1.0)
Monocytes Relative: 4 %
Neutro Abs: 11.9 10*3/uL — ABNORMAL HIGH (ref 1.7–7.7)
Neutrophils Relative %: 83 %
Platelets: 254 10*3/uL (ref 150–400)
RBC: 3.79 MIL/uL — ABNORMAL LOW (ref 3.87–5.11)
RDW: 15 % (ref 11.5–15.5)
WBC: 14.3 10*3/uL — ABNORMAL HIGH (ref 4.0–10.5)
nRBC: 0 % (ref 0.0–0.2)

## 2021-07-13 LAB — OSMOLALITY: Osmolality: 243 mOsm/kg — CL (ref 275–295)

## 2021-07-13 LAB — PROCALCITONIN
Procalcitonin: <0.1 ng/mL
Procalcitonin: <0.1 ng/mL

## 2021-07-13 LAB — SODIUM
Sodium: 116 mmol/L — CL (ref 135–145)
Sodium: 114 mmol/L — CL (ref 135–145)
Sodium: 110 mmol/L — CL (ref 135–145)
Sodium: 113 mmol/L — CL (ref 135–145)

## 2021-07-13 LAB — C-REACTIVE PROTEIN: CRP: 0.5 mg/dL (ref <1.0)

## 2021-07-13 LAB — BRAIN NATRIURETIC PEPTIDE: B Natriuretic Peptide: 221.1 pg/mL — ABNORMAL HIGH (ref 0.0–100.0)

## 2021-07-13 LAB — MAGNESIUM: Magnesium: 1.7 mg/dL (ref 1.7–2.4)

## 2021-07-13 LAB — OSMOLALITY, URINE: Osmolality, Ur: 381 mosm/kg (ref 300–900)

## 2021-07-13 MED ORDER — MAGNESIUM SULFATE 2 GM/50ML IV SOLN
2.0000 g | Freq: Once | INTRAVENOUS | Status: AC
  Administered 2021-07-13: 2 g via INTRAVENOUS
  Filled 2021-07-13: qty 50

## 2021-07-13 MED ORDER — DM-GUAIFENESIN ER 30-600 MG PO TB12
1.0000 | ORAL_TABLET | Freq: Two times a day (BID) | ORAL | Status: DC | PRN
  Administered 2021-07-13 – 2021-07-16 (×3): 1 via ORAL
  Filled 2021-07-13 (×3): qty 1

## 2021-07-13 MED ORDER — POTASSIUM CHLORIDE CRYS ER 20 MEQ PO TBCR
40.0000 meq | EXTENDED_RELEASE_TABLET | Freq: Once | ORAL | Status: AC
Start: 2021-07-13 — End: 2021-07-13
  Administered 2021-07-13: 40 meq via ORAL
  Filled 2021-07-13: qty 2

## 2021-07-13 MED ORDER — SODIUM CHLORIDE 3 % IV BOLUS: 100.0000 mL | Freq: Once | INTRAVENOUS | Status: DC

## 2021-07-13 MED ORDER — SODIUM CHLORIDE 3 % IV SOLN
INTRAVENOUS | Status: AC
  Filled 2021-07-13: qty 500

## 2021-07-13 MED ORDER — CARVEDILOL 3.125 MG PO TABS
3.1250 mg | ORAL_TABLET | Freq: Two times a day (BID) | ORAL | Status: DC
  Administered 2021-07-13 – 2021-07-17 (×8): 3.125 mg via ORAL
  Filled 2021-07-13 (×9): qty 1

## 2021-07-13 MED ORDER — SODIUM CHLORIDE 3 % IV SOLN
INTRAVENOUS | Status: DC
  Filled 2021-07-13: qty 500

## 2021-07-13 MED ORDER — DILTIAZEM HCL ER COATED BEADS 180 MG PO CP24
180.0000 mg | ORAL_CAPSULE | Freq: Every morning | ORAL | Status: DC
  Administered 2021-07-14 – 2021-07-18 (×5): 180 mg via ORAL
  Filled 2021-07-13 (×5): qty 1

## 2021-07-13 MED ORDER — SODIUM CHLORIDE 3 % IV SOLN: INTRAVENOUS | Status: DC

## 2021-07-13 NOTE — Evaluation (Signed)
Physical Therapy Evaluation ?Patient Details ?Name: Lorraine Brandt ?MRN: [id number] ?DOB: 1933-11-23 ?Today's Date: 07/13/2021 ? ?History of Present Illness ? 88 y.o. female, who has been gradually getting weaker over the last few days, also has developed problems laying flat as she gets short of breath. She presented to the ER 07/12/21 where she was found to have severe hyponatremia--likely SIADH.  She sustained 2 falls over the last 2 weeks and one of the falls she hurt her neck, she was diagnosed with C6-C7 fracture she saw neurosurgery outpatient and was placed in a c-collar.  PMH- osteoarthritis, hypothyroidism, hypertension, polymyalgia rheumatica, dyslipidemia ,  ?Clinical Impression ?  ?Pt admitted secondary to problem above with deficits below. PTA patient was living in [place] with spouse (who has dementia) and was independent with all mobility and ADLs. Pt currently requires moderate assist to stand and unsafe to ambulate due to orthostatic hypotension (see vitals flowsheet). If this resolves, she may be able to return to ILF with HHPT, however currently recommending SNF (at [facility]). Anticipate patient will benefit from PT to address problems listed below.Will continue to follow acutely to maximize functional mobility independence and safety.   ?   ?   ? ?Recommendations for follow up therapy are one component of a multi-disciplinary discharge planning process, led by the attending physician.  Recommendations may be updated based on patient status, additional functional criteria and insurance authorization. ? ?Follow Up Recommendations Skilled nursing-short term rehab (<3 hours/day) (if progresses quickly, ?return to ALF with [facility]) ? ?  ?Assistance Recommended at Discharge Frequent or constant Supervision/Assistance  ?Patient can return home with the following ? A lot of help with walking and/or transfers;A lot of help with bathing/dressing/bathroom;Assistance with cooking/housework;Assist for  transportation ? ?  ?Equipment Recommendations Rollator (4 wheels)  ?Recommendations for Other Services ? OT consult  ?  ?Functional Status Assessment Patient has had a recent decline in their functional status and demonstrates the ability to make significant improvements in function in a reasonable and predictable amount of time.  ? ?  ?Precautions / Restrictions Precautions ?Precautions: Fall ?Precaution Comments: 2 falls in past week ?Required Braces or Orthoses: Cervical Brace ?Cervical Brace: Hard collar;At all times ?Restrictions ?Weight Bearing Restrictions: No  ? ?  ? ?Mobility ? Bed Mobility ?  ?  ?  ?  ?  ?  ?  ?General bed mobility comments: up in recliner ?  ? ?Transfers ?Overall transfer level: Needs assistance ?Equipment used: 1 person hand held assist ?Transfers: Sit to/from Stand ?Sit to Stand: Mod assist ?  ?  ?  ?  ?  ?General transfer comment: required 2 attempts to come to standing due to leg weakness ?  ? ?Ambulation/Gait ?  ?  ?  ?  ?  ?  ?  ?General Gait Details: deferred due to orthostasis and weakness ? ?Stairs ?  ?  ?  ?  ?  ? ?Wheelchair Mobility ?  ? ?Modified Rankin (Stroke Patients Only) ?  ? ?  ? ?Balance Overall balance assessment: Needs assistance ?Sitting-balance support: No upper extremity supported, Feet supported ?Sitting balance-Leahy Scale: Fair ?  ?  ?Standing balance support: Bilateral upper extremity supported, During functional activity ?Standing balance-Leahy Scale: Poor ?Standing balance comment: posterior lean in standing ?  ?  ?  ?  ?  ?  ?  ?  ?  ?  ?  ?   ? ? ? ?Pertinent Vitals/Pain Pain Assessment ?Pain Assessment: No/denies pain  ? ? ?  Home Living Family/patient expects to be discharged to:: Private residence ?Living Arrangements: Spouse/significant other (spouse has dementia) ?Available Help at Discharge: Family;Friend(s);Available PRN/intermittently ?Type of Home: Apartment ?Home Access: Level entry ?  ?  ?  ?Home Layout: One level ?Home Equipment: Conservation officer, nature  (2 wheels) ?Additional Comments: states if she needs to use a device she'd like to try a rollator  ?  ?Prior Function Prior Level of Function : Independent/Modified Independent ?  ?  ?  ?  ?  ?  ?Mobility Comments: walks "everywhere" without a device; [facility] in [facility] center [facility] ?  ?  ? ? ?Hand Dominance  ?   ? ?  ?Extremity/Trunk Assessment  ? Upper Extremity Assessment ?Upper Extremity Assessment: Defer to OT evaluation ?  ? ?Lower Extremity Assessment ?Lower Extremity Assessment: Generalized weakness (required mod assist to stand) ?  ? ?Cervical / Trunk Assessment ?Cervical / Trunk Assessment: Other exceptions ?Cervical / Trunk Exceptions: in cervical collar due to C6-7 fractures from recent fall  ?Communication  ? Communication: HOH  ?Cognition Arousal/Alertness: Awake/alert ?Behavior During Therapy: [facility] for tasks assessed/performed ?Overall Cognitive Status: Within Functional Limits for tasks assessed ?  ?  ?  ?  ?  ?  ?  ?  ?  ?  ?  ?  ?  ?  ?  ?  ?  ?  ?  ? ?  ?General Comments General comments (skin integrity, edema, etc.): Educated in need for caution with activity (currently no walking) due to asymptomatic orthostasis, hyponatremia, and hypokalemia ? ?  ?Exercises    ? ?Assessment/Plan  ?  ?PT Assessment Patient needs continued PT services  ?PT Problem List Decreased strength;Decreased activity tolerance;Decreased balance;Decreased mobility;Decreased knowledge of use of DME;Decreased knowledge of precautions;Cardiopulmonary status limiting activity;Pain ? ?   ?  ?PT Treatment Interventions DME instruction;Gait training;Functional mobility training;Therapeutic activities;Therapeutic exercise;Balance training;Patient/family education   ? ?PT Goals (Current goals can be found in the Care Plan section)  ?Acute Rehab PT Goals ?Patient Stated Goal: wants to be strong enough to not fall ?PT Goal Formulation: With patient ?Time For Goal Achievement: 08-07-21 ?Potential to Achieve Goals:  Good ? ?  ?Frequency Min 3X/week ?  ? ? ?Co-evaluation   ?  ?  ?  ?  ? ? ?  ?AM-PAC PT "6 Clicks" Mobility  ?Outcome Measure Help needed turning from your back to your side while in a flat bed without using bedrails?: A Little ?Help needed moving from lying on your back to sitting on the side of a flat bed without using bedrails?: A Little ?Help needed moving to and from a bed to a chair (including a wheelchair)?: A Lot ?Help needed standing up from a chair using your arms (e.g., wheelchair or bedside chair)?: A Lot ?Help needed to walk in hospital room?: Total ?Help needed climbing 3-5 steps with a railing? : Total ?6 Click Score: 12 ? ?  ?End of Session Equipment Utilized During Treatment: Gait belt ?Activity Tolerance: Treatment limited secondary to medical complications (Comment) (orthostasis and weakness) ?Patient left: in chair;with call bell/phone within reach (pt up on arrival without chair alarm) ?Nurse Communication: Mobility status;Other (comment) (orthostasis) ?PT Visit Diagnosis: Difficulty in walking, not elsewhere classified (R26.2);Muscle weakness (generalized) (M62.81);History of falling (Z91.81) ?  ? ?Time: 3009-2330 ?PT Time Calculation (min) (ACUTE ONLY): 18 min ? ? ?Charges:   PT Evaluation ?$PT Eval Low Complexity: 1 Low ?  ?  ?   ? ? ? ?Arby Barrette,  PT ?Acute Rehabilitation Services  ?Pager [phone] ?Office [phone] ? ? ?Jeanie Cooks Cammy Sanjurjo ?07/13/2021, 10:45 AM ? ?

## 2021-07-13 NOTE — Evaluation (Signed)
Occupational Therapy Evaluation ?Patient Details ?Name: Lorraine Brandt ?MRN: [id number] ?DOB: 22-Aug-1933 ?Today's Date: 07/13/2021 ? ? ?History of Present Illness 88 y.o. female, who has been gradually getting weaker over the last few days, also has developed problems laying flat as she gets short of breath. She presented to the ER 07/12/21 where she was found to have severe hyponatremia--likely SIADH.  She sustained 2 falls over the last 2 weeks and one of the falls she hurt her neck, she was diagnosed with C6-C7 fracture she saw neurosurgery outpatient and was placed in a c-collar.  PMH- osteoarthritis, hypothyroidism, hypertension, polymyalgia rheumatica, dyslipidemia ,  ? ?Clinical Impression ?  ?Pt admitted for concerns listed above. 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 At this time, pt presents with increased weakness and balance deficits. She is requiring min-mod A for functional mobility and transfers, as well as min guard to max A for ADL's. Recommending SNF at this time to maximize her independence and OT will follow up acutely.   ?   ? ?Recommendations for follow up therapy are one component of a multi-disciplinary discharge planning process, led by the attending physician.  Recommendations may be updated based on patient status, additional functional criteria and insurance authorization.  ? ?Follow Up Recommendations ? Skilled nursing-short term rehab (<3 hours/day)  ?  ?Assistance Recommended at Discharge Frequent or constant Supervision/Assistance  ?Patient can return home with the following A lot of help with walking and/or transfers;A lot of help with bathing/dressing/bathroom;Direct supervision/assist for financial management;Help with stairs or ramp for entrance;Assist for transportation ? ?  ?Functional Status Assessment ? Patient has had a recent decline in their functional status and demonstrates the ability to make  significant improvements in function in a reasonable and predictable amount of time.  ?Equipment Recommendations ? None recommended by OT  ?  ?Recommendations for Other Services   ? ? ?  ?Precautions / Restrictions Precautions ?Precautions: Fall ?Precaution Comments: 2 falls in past week ?Required Braces or Orthoses: Cervical Brace ?Cervical Brace: Hard collar;At all times ?Restrictions ?Weight Bearing Restrictions: No  ? ?  ? ?Mobility Bed Mobility ?  ?  ?  ?  ?  ?  ?  ?General bed mobility comments: up in recliner ?  ? ?Transfers ?Overall transfer level: Needs assistance ?Equipment used: Rolling walker (2 wheels) ?Transfers: Sit to/from Stand ?Sit to Stand: Mod assist ?  ?  ?  ?  ?  ?General transfer comment: required 2 attempts to come to standing due to leg weakness ?  ? ?  ?Balance Overall balance assessment: Needs assistance ?Sitting-balance support: No upper extremity supported, Feet supported ?Sitting balance-Leahy Scale: Fair ?  ?  ?Standing balance support: Bilateral upper extremity supported, During functional activity ?Standing balance-Leahy Scale: Poor ?Standing balance comment: posterior lean in standing ?  ?  ?  ?  ?  ?  ?  ?  ?  ?  ?  ?   ? ?ADL either performed or assessed with clinical judgement  ? ?ADL Overall ADL's : Needs assistance/impaired ?Eating/Feeding: Set up;Sitting ?  ?Grooming: Set up;Sitting ?  ?Upper Body Bathing: Minimal assistance;Sitting ?  ?Lower Body Bathing: Moderate assistance;Sitting/lateral leans;Sit to/from stand ?  ?Upper Body Dressing : Minimal assistance;Sitting ?  ?Lower Body Dressing: Maximal assistance;Sitting/lateral leans;Sit to/from stand ?  ?Toilet Transfer: Moderate assistance;Stand-pivot ?  ?Toileting- Clothing Manipulation and Hygiene: Moderate assistance;Sitting/lateral lean;Sit to/from stand ?  ?  ?  ?  Functional mobility during ADLs: Moderate assistance;Rolling walker (2 wheels) ?General ADL Comments: Pt limited by weakness and balance deficits  ? ? ? ?Vision  Baseline Vision/History: 1 Wears glasses ?Ability to See in Adequate Light: 0 Adequate ?Patient Visual Report: No change from baseline ?Vision Assessment?: No apparent visual deficits  ?   ?Perception   ?  ?Praxis   ?  ? ?Pertinent Vitals/Pain Pain Assessment ?Pain Assessment: No/denies pain  ? ? ? ?Hand Dominance Right ?  ?Extremity/Trunk Assessment Upper Extremity Assessment ?Upper Extremity Assessment: Overall WFL for tasks assessed ?  ?Lower Extremity Assessment ?Lower Extremity Assessment: Defer to PT evaluation ?  ?Cervical / Trunk Assessment ?Cervical / Trunk Assessment: Other exceptions ?Cervical / Trunk Exceptions: in cervical collar due to C6-7 fractures from recent fall ?  ?Communication Communication ?Communication: HOH ?  ?Cognition Arousal/Alertness: Awake/alert ?Behavior During Therapy: [facility] for tasks assessed/performed ?Overall Cognitive Status: Within Functional Limits for tasks assessed ?  ?  ?  ?  ?  ?  ?  ?  ?  ?  ?  ?  ?  ?  ?  ?  ?  ?  ?  ?General Comments  VSS on RA, cervical collar donned ? ?  ?Exercises   ?  ?Shoulder Instructions    ? ? ?Home Living Family/patient expects to be discharged to:: Private residence ?Living Arrangements: Spouse/significant other (spouse has dementia) ?Available Help at Discharge: Family;Friend(s);Available PRN/intermittently ?Type of Home: Apartment ?Home Access: Level entry ?  ?  ?Home Layout: One level ?  ?  ?Bathroom Shower/Tub: Walk-in shower ?  ?Bathroom Toilet: Standard ?Bathroom Accessibility: Yes ?How Accessible: Accessible via walker ?Home Equipment: Conservation officer, nature (2 wheels);Cane - single point ?  ?Additional Comments: states if she needs to use a device she'd like to try a rollator ?  ? ?  ?Prior Functioning/Environment Prior Level of Function : Independent/Modified Independent ?  ?  ?  ?  ?  ?  ?Mobility Comments: walks "everywhere" without a device; exercises in wellness center at [facility] ?  ?  ? ?  ?  ?OT Problem List: Decreased  strength;Decreased activity tolerance;Impaired balance (sitting and/or standing);Decreased coordination;Decreased cognition;Decreased safety awareness;Decreased knowledge of use of DME or AE;Pain ?  ?   ?OT Treatment/Interventions: Self-care/ADL training;Therapeutic exercise;Energy conservation;DME and/or AE instruction;Therapeutic activities;Patient/family education;Balance training  ?  ?OT Goals(Current goals can be found in the care plan section) Acute Rehab OT Goals ?Patient Stated Goal: to go to rehab ?OT Goal Formulation: With patient ?Time For Goal Achievement: 08/07/2021 ?Potential to Achieve Goals: Good  ?OT Frequency: Min 2X/week ?  ? ?Co-evaluation   ?  ?  ?  ?  ? ?  ?AM-PAC OT "6 Clicks" Daily Activity     ?Outcome Measure Help from another person eating meals?: A Little ?Help from another person taking care of personal grooming?: A Little ?Help from another person toileting, which includes using toliet, bedpan, or urinal?: A Lot ?Help from another person bathing (including washing, rinsing, drying)?: A Lot ?Help from another person to put on and taking off regular upper body clothing?: A Little ?Help from another person to put on and taking off regular lower body clothing?: A Lot ?6 Click Score: 15 ?  ?End of Session Equipment Utilized During Treatment: Gait belt;Rolling walker (2 wheels);Cervical collar ?Nurse Communication: Mobility status ? ?Activity Tolerance: Patient tolerated treatment well ?Patient left: in chair;with call bell/phone within reach ? ?OT Visit Diagnosis: Unsteadiness on feet (R26.81);Other abnormalities of gait and  mobility (R26.89);Muscle weakness (generalized) (M62.81)  ?              ?Time: [phone] ?OT Time Calculation (min): 36 min ?Charges:  OT General Charges ?$OT Visit: 1 Visit ?OT Evaluation ?$OT Eval Moderate Complexity: 1 Mod ?OT Treatments ?$Therapeutic Activity: 8-22 mins ? ?Delma Drone H., OTR/L ?Acute Rehabilitation ? ?Khamani Fairley Elane Yolanda Bonine ?07/13/2021, 6:40 PM ?

## 2021-07-13 NOTE — ED Notes (Signed)
Report given to April rn on 5w ?

## 2021-07-13 NOTE — ED Notes (Signed)
Pt is sleeping

## 2021-07-13 NOTE — Progress Notes (Signed)
Patient transferred from ED to [phone]. Patient is alert and oriented to person, place, time, and situation. Telemetry monitoring enabled, vital signs taken, and IV assessed for patency. Skin checked with Lucina Mellow. Fall precautions initiated. Patient bed in the locked, lowest position. Non-slip socks in place and bed alarm on. Call bell is within reach. Patient knows to call for assistance prior to getting up and patient demonstrates use. Patient is laying comfortably in bed.  ? ?

## 2021-07-13 NOTE — ED Notes (Signed)
Output 821m ?

## 2021-07-13 NOTE — Progress Notes (Signed)
Purple man has been placed in the system since 1958. ED RN tried to call report while in another patient's room providing care. Called ED to receive report from [facility].  ?

## 2021-07-13 NOTE — Progress Notes (Addendum)
Notified of SNa down to 110, was 114 prior.  She has remained ASx but her presenting symptoms were suggestive of hyponatremia.  She has failed fluid restriction + furosemide.   ? ?There has been some concern for Acute CHF exacerbation but I think this pattern most fits worsened SIADH.  She has grade 2 dd on exam, CXR with findings described most consistent with ILD.  BNP is borderline elevated, of unclera significance. ? ?Given worsened severe hyponatremia and working dx of SIADH, failing fluid restriction, start 3% saline, 151m bolus followed by 23mh. Trend SNa q2h at first, then q4h, Goal is SNa back between 115 and 118 by the morning.   ? ?Repeat UOsm and Una to confirm the renal response.   ?

## 2021-07-13 NOTE — Progress Notes (Addendum)
PROGRESS NOTE                                                                                                                                                                                                             Patient Demographics:    Lorraine Brandt, is a 88 y.o. female, DOB - 10-12-1933, WSF:[id number]  Outpatient Primary MD for the patient is Reynold Bowen, MD    LOS - 1  Admit date - 07/12/2021    Chief Complaint  Patient presents with   Altered Mental Status       Brief Narrative (HPI from H&P)     Lorraine Brandt  is a 88 y.o. female, with history of osteoarthritis, hypothyroidism, hypertension, polymyalgia rheumatica, dyslipidemia who has been gradually getting weaker over the last few days, also has developed problems laying flat as she gets short of breath, she sustained 2 falls over the last 2 weeks and one of the falls she hurt her neck, she was diagnosed with C6-C7 fracture she saw neurosurgery outpatient and was placed in a c-collar.  She was also placed on a steroid taper.  She continues to get weak and presented to the ER today where she was found to have severe hyponatremia.   Subjective:    Lorraine Brandt today has, No headache, No chest pain, No abdominal pain - No Nausea, No new weakness tingling or numbness, mild dry cough but shortness of breath has improved.   Assessment  & Plan :     1.  Generalized weakness, falls at home due to severe hyponatremia.  Patient appears quite frail and deconditioned, due  to her falls she has sustained a C6-C7 fracture as well -she has neck pain was on Lexapro which could cause SIADH, urine sodium is 77 and urine osmolality is much higher than serum.  However she also has evidence of fluid overload on exam  with crackles and history of orthopnea, echo now shows severe grade 2 diastolic dysfunction as well.  Nephrology following treatment for both remains diuresis which is being done sodium gradually improving.  Continue to monitor.     2.  Acute on chronic diastolic CHF last EF 95%.  Echo noted ++ d CHF, BNP high, has orthopnea, SOB on exertion and crackles on exam, chest x-ray also shows fluid overload, continue diuresis as above.  If blood pressure allows will try to continue low-dose beta-blocker.     3.  Recent fall at home with C7 endplate and C6 vertebral body fracture.  She has seen neurosurgery in the outpatient setting currently had a c-collar which she has to wear at all times till she sees neurosurgery and they take it off.  She is finishing her prednisone taper in the next few days.   4.  Hypertension.  For now low-dose Coreg and monitor we will avoid ACE and ARB till fluid status is stabilized.   5.  Osteoarthritis.  Supportive care.   6.  Dyslipidemia.  On statin.   7.  Depression.  Will hold Lexapro until sodium levels stabilize.      Condition - Fair  Family Communication  :  daughter bedside 07/13/21  Code Status :  Full  Consults  :  Renal  PUD Prophylaxis : PPI   Procedures  :     TTE - 1. Prominent basal septal hypertrophy and hypertrophied papilary muscles no mid cavitary gradient recoreded No SAM . Left ventricular ejection fraction, by estimation, is 60 to 65%. The left ventricle has normal function. The left ventricle has no regional wall motion abnormalities. There is severe asymmetric left ventricular hypertrophy of the basal and septal segments. Left ventricular diastolic parameters are consistent with Grade II diastolic dysfunction (pseudonormalization). Elevated left ventricular end-diastolic pressure.  2. Right ventricular systolic function is normal. The right ventricular size is normal.  3. The pericardial effusion is posterior to the left ventricle.  4. The  mitral valve is abnormal. Trivial mitral valve regurgitation. No evidence of mitral stenosis.  5. Small gradient across AV but morphologically opens well . The aortic valve is tricuspid. There is mild calcification of the aortic valve. There is mild thickening of the aortic valve. Aortic valve regurgitation is mild. Aortic valve sclerosis is present, with no evidence of aortic valve stenosis.  6. The inferior vena cava is normal in size with greater than 50% respiratory variability, suggesting right atrial pressure of 3 mmHg.      Disposition Plan  :    Status is: Inpatient  DVT Prophylaxis  :    Place TED hose Start: 07/13/21 1028 heparin injection 5,000 Units Start: 07/12/21 1400  Lab Results  Component Value Date   PLT 254 07/13/2021    Diet :  Diet Order             Diet regular Room service appropriate? Yes with Assist; Fluid consistency: Thin; Fluid restriction: Other (see comments)  Diet effective now                    Inpatient Medications  Scheduled Meds:  carvedilol  3.125 mg Oral BID WC   [START ON  07/14/2021] diltiazem  180 mg Oral q morning   furosemide  20 mg Intravenous BID   heparin  5,000 Units Subcutaneous Q8H   [START ON 07/15/2021] levothyroxine  125 mcg Oral Once per day on Mon Tue Wed Thu Fri   pantoprazole  40 mg Oral Daily   potassium chloride  40 mEq Oral Once   predniSONE  5 mg Oral Q breakfast   rosuvastatin  20 mg Oral Daily   Continuous Infusions: PRN Meds:.acetaminophen **OR** acetaminophen, bisacodyl, ondansetron **OR** ondansetron (ZOFRAN) IV, traMADol  Antibiotics  :    Anti-infectives (From admission, onward)    None        Time Spent in minutes  30   Lala Lund M.D on 07/13/2021 at 10:41 AM  To page go to [email or web address]   [facility] -  Office  [phone]  See all Orders from today for further details    Objective:   Vitals:   07/13/21 0120 07/13/21 0406 07/13/21 0455 07/13/21 0810  BP: (!) 124/46 122/82   (!) 133/58  Pulse: 61 71  (!) 56  Resp: '16 15  16  '$ Temp: 98.1 F (36.7 C) 98.5 F (36.9 C) 98.2 F (36.8 C) 97.7 F (36.5 C)  TempSrc: Oral Oral Oral Oral  SpO2: 94% 97%  95%  Weight: 60.2 kg     Height: '5\' 1"'$  (1.549 m)       Wt Readings from Last 3 Encounters:  07/13/21 60.2 kg  07/03/21 56.7 kg  07/28/19 58.5 kg     Intake/Output Summary (Last 24 hours) at 07/13/2021 1041 Last data filed at 07/13/2021 0436 Gross per 24 hour  Intake 680.59 ml  Output 70 ml  Net 610.59 ml     Physical Exam  Awake Alert, No new F.N deficits, Normal affect [place].AT,PERRAL Wearing a c-collar, Symmetrical Chest wall movement, Good air movement bilaterally, +ve rales RRR,No Gallops,Rubs or new Murmurs, No Parasternal Heave +ve B.Sounds, Abd Soft, No tenderness, No organomegaly appriciated, No rebound - guarding or rigidity. No Cyanosis, Clubbing or edema, No new Rash or bruise     Data Review:    CBC Recent Labs  Lab 07/12/21 0945 07/12/21 1005 07/13/21 0201  WBC 15.5*  --  14.3*  HGB 13.5 13.6 12.6  HCT 36.5 40.0 33.9*  PLT 268  --  254  MCV 89.5  --  89.4  MCH 33.1  --  33.2  MCHC 37.0*  --  37.2*  RDW 14.9  --  15.0  LYMPHSABS 1.4  --  1.2  MONOABS 1.0  --  0.6  EOSABS 0.1  --  0.1  BASOSABS 0.0  --  0.0    Recent Labs  Lab 07/12/21 0945 07/12/21 1005 07/12/21 1250 07/12/21 1525 07/12/21 1900 07/13/21 0201  NA 114* 114*  --  114* 116* 116*   116*  K 3.8 3.7  --   --   --  3.2*  CL 82* 80*  --   --   --  83*  CO2 24  --   --   --   --  27  GLUCOSE 86 88  --   --   --  91  BUN 16 19  --   --   --  12  CREATININE 0.46 0.40*  --   --   --  0.40*  CALCIUM 7.9*  --   --   --   --  7.7*  AST 27  --   --   --   --  19  ALT 25  --   --   --   --  25  ALKPHOS 73  --   --   --   --  79  BILITOT 1.3*  --   --   --   --  1.0  ALBUMIN 3.3*  --   --   --   --  2.9*  MG 1.8  --   --   --   --  1.7  CRP  --   --  <0.5  --   --  0.5  PROCALCITON  --   --   --   --   --   <0.10   <0.10  BNP 204.3*  --   --   --   --  221.1*    ------------------------------------------------------------------------------------------------------------------ No results for input(s): CHOL, HDL, LDLCALC, TRIG, CHOLHDL, LDLDIRECT in the last 72 hours.  No results found for: HGBA1C ------------------------------------------------------------------------------------------------------------------ No results for input(s): TSH, T4TOTAL, T3FREE, THYROIDAB in the last 72 hours.  Invalid input(s): FREET3  Cardiac Enzymes No results for input(s): CKMB, TROPONINI, MYOGLOBIN in the last 168 hours.  Invalid input(s): CK ------------------------------------------------------------------------------------------------------------------    Component Value Date/Time   BNP 221.1 (H) 07/13/2021 0201   Radiology Reports DG Chest 2 View  Result Date: 07/12/2021 CLINICAL DATA:  Pt brought from home for c/o intermittent AMS, increased general weakness and decreased mobility. Pt has non productive cough. Pt denies any CP but states she has been feeling SOB x a few days Nonsmoker Hx of HTN, cancer, GERD, EXAM: CHEST - 2 VIEW COMPARISON:  04/19/2014 FINDINGS: Progressive development of coarse peripheral interstitial opacities, worse left than right, involving bases more than apices. Heart size upper limits normal.  Tortuous ectatic thoracic aorta. No effusion. Levoscoliosis in the upper thoracic spine with suspected vertebral segmentation anomaly, incompletely characterized. IMPRESSION: 1. Coarse asymmetric interstitial opacities left greater than right, suggesting progressive chronic interstitial lung disease. Cannot exclude superimposed interstitial infiltrate or asymmetric edema. Electronically Signed   By: Lucrezia Europe M.D.   On: 07/12/2021 10:10   DG Lumbar Spine Complete  Result Date: 07/03/2021 CLINICAL DATA:  Status post fall with lower back pain. EXAM: LUMBAR SPINE - COMPLETE 4+ VIEW COMPARISON:   February 25, 2021 FINDINGS: There is no evidence of acute lumbar spine fracture. Mild dextroscoliosis is seen with stable grade 1 anterolisthesis of the L4 vertebral body on L5. Mild to moderate severity multilevel endplate sclerosis is seen. Stable marked severity intervertebral disc space narrowing is noted at the level of L4-L5. IMPRESSION: 1. No evidence of acute lumbar spine fracture. 2. Stable marked severity degenerative disc disease at L4-L5 with stable grade 1 anterolisthesis of the L4 vertebral body. Electronically Signed   By: Virgina Norfolk M.D.   On: 07/03/2021 02:47   CT Head Wo Contrast  Result Date: 07/04/2021 CLINICAL DATA:  Trauma EXAM: CT HEAD WITHOUT CONTRAST TECHNIQUE: Contiguous axial images were obtained from the base of the skull through the vertex without intravenous contrast. RADIATION DOSE REDUCTION: This exam was performed according to the departmental dose-optimization program which includes automated exposure control, adjustment of the mA and/or kV according to patient size and/or use of iterative reconstruction technique. COMPARISON:  07/03/2021 FINDINGS: Brain: No acute intracranial findings are seen. There are no signs of intracranial bleeding. There are no epidural or subdural fluid collections. Cortical sulci are prominent. There is decreased density in the periventricular white matter. Vascular: Unremarkable. Skull: No fracture is seen in the calvarium. There is  subcutaneous contusion/hematoma in the posterior parietal scalp. Sinuses/Orbits: There are no air-fluid levels. Other: None IMPRESSION: No acute intracranial findings are seen in noncontrast CT brain. There is subcutaneous contusion/hematoma in the parietal scalp. No fracture is seen in the calvarium. Electronically Signed   By: Elmer Picker M.D.   On: 07/04/2021 17:47   CT Head Wo Contrast  Result Date: 07/03/2021 CLINICAL DATA:  Recent slip and fall with headaches and neck pain, initial encounter EXAM:  CT HEAD WITHOUT CONTRAST CT CERVICAL SPINE WITHOUT CONTRAST TECHNIQUE: Multidetector CT imaging of the head and cervical spine was performed following the standard protocol without intravenous contrast. Multiplanar CT image reconstructions of the cervical spine were also generated. RADIATION DOSE REDUCTION: This exam was performed according to the departmental dose-optimization program which includes automated exposure control, adjustment of the mA and/or kV according to patient size and/or use of iterative reconstruction technique. COMPARISON:  04/18/2019 FINDINGS: CT HEAD FINDINGS Brain: No evidence of acute infarction, hemorrhage, hydrocephalus, extra-axial collection or mass lesion/mass effect. Chronic atrophic and ischemic changes are noted. Vascular: No hyperdense vessel or unexpected calcification. Skull: Normal. Negative for fracture or focal lesion. Sinuses/Orbits: No acute finding. Other: None. CT CERVICAL SPINE FINDINGS Alignment: Loss of the normal cervical lordosis is noted. Anterolisthesis of C4 on C5 is seen felt to be degenerative in nature. Skull base and vertebrae: 7 cervical segments are well visualized. Vertebral body height is well maintained. At least partial fusion at C5-6 is noted. Anterolisthesis of C4 on C5 is noted of a degenerative nature. No acute fracture or acute facet abnormality is noted. Multilevel facet hypertrophic changes are seen. Significant scoliosis in the upper thoracic spine is noted concave to the left. There are findings suspicious for hemi vertebra between C6 and T1 vertebra on the left. Soft tissues and spinal canal: Surrounding soft tissue structures show vascular calcifications. Upper chest: Visualized lung apices are within normal limits. Other: None IMPRESSION: CT of the head: Chronic atrophic and ischemic changes. CT of the cervical spine: Multilevel degenerative change. No acute abnormality noted. 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 Electronically Signed    By: Inez Catalina M.D.   On: 07/03/2021 04:02   CT Cervical Spine Wo Contrast  Addendum Date: 07/04/2021   ADDENDUM REPORT: 07/04/2021 17:44 ADDENDUM: Imaging findings were relayed to patient's provider Theodis Blaze by telephone call. Electronically Signed   By: Elmer Picker M.D.   On: 07/04/2021 17:44   Result Date: 07/04/2021 CLINICAL DATA:  Trauma EXAM: CT CERVICAL SPINE WITHOUT CONTRAST TECHNIQUE: Multidetector CT imaging of the cervical spine was performed without intravenous contrast. Multiplanar CT image reconstructions were also generated. RADIATION DOSE REDUCTION: This exam was performed according to the departmental dose-optimization program which includes automated exposure control, adjustment of the mA and/or kV according to patient size and/or use of iterative reconstruction technique. COMPARISON:  07/03/2021 FINDINGS: Alignment: There is minimal anterolisthesis at C4-C5 level suggesting previous ligament injury and facet degeneration. There is congenital partial fusion of bodies of C5 and C6 vertebrae. There is a radiolucent line in the right lamina of C6 vertebra. There is radiolucent line in the left anterior superior C7 vertebra. There is congenital partial fusion of bodies of T1 and T2 vertebrae. 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 Skull base and vertebrae: Degenerative changes are noted with bony spurs and facet hypertrophy at multiple levels in the cervical spine. Soft tissues and spinal canal: There is no significant central spinal stenosis.  Disc levels: There is encroachment of neural foramina from C2-C7 levels. Upper chest: Unremarkable. Other: Thyroid is not seen suggesting atrophy or previous removal. IMPRESSION: There is radiolucent line in the right lamina of C6 vertebra. There is radiolucent line in the anterior superior left side body C7 vertebra. Possibility of recent fractures is not excluded. Follow-up MRI as clinically warranted  should be considered. Cervical spondylosis with encroachment of neural foramina at multiple levels. Congenital deformity is seen in the lower cervical and upper thoracic spine. Other findings as described in the body of the report. Electronically Signed: By: Elmer Picker M.D. On: 07/04/2021 17:40   CT Cervical Spine Wo Contrast  Result Date: 07/03/2021 CLINICAL DATA:  Recent slip and fall with headaches and neck pain, initial encounter EXAM: CT HEAD WITHOUT CONTRAST CT CERVICAL SPINE WITHOUT CONTRAST TECHNIQUE: Multidetector CT imaging of the head and cervical spine was performed following the standard protocol without intravenous contrast. Multiplanar CT image reconstructions of the cervical spine were also generated. RADIATION DOSE REDUCTION: This exam was performed according to the departmental dose-optimization program which includes automated exposure control, adjustment of the mA and/or kV according to patient size and/or use of iterative reconstruction technique. COMPARISON:  04/18/2019 FINDINGS: CT HEAD FINDINGS Brain: No evidence of acute infarction, hemorrhage, hydrocephalus, extra-axial collection or mass lesion/mass effect. Chronic atrophic and ischemic changes are noted. Vascular: No hyperdense vessel or unexpected calcification. Skull: Normal. Negative for fracture or focal lesion. Sinuses/Orbits: No acute finding. Other: None. CT CERVICAL SPINE FINDINGS Alignment: Loss of the normal cervical lordosis is noted. Anterolisthesis of C4 on C5 is seen felt to be degenerative in nature. Skull base and vertebrae: 7 cervical segments are well visualized. Vertebral body height is well maintained. At least partial fusion at C5-6 is noted. Anterolisthesis of C4 on C5 is noted of a degenerative nature. No acute fracture or acute facet abnormality is noted. Multilevel facet hypertrophic changes are seen. Significant scoliosis in the upper thoracic spine is noted concave to the left. There are findings  suspicious for hemi vertebra between C6 and T1 vertebra on the left. Soft tissues and spinal canal: Surrounding soft tissue structures show vascular calcifications. Upper chest: Visualized lung apices are within normal limits. Other: None IMPRESSION: CT of the head: Chronic atrophic and ischemic changes. CT of the cervical spine: Multilevel degenerative change. No acute abnormality noted. 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 Electronically Signed   By: Inez Catalina M.D.   On: 07/03/2021 04:02   MR CERVICAL SPINE WO CONTRAST  Result Date: 07/10/2021 CLINICAL DATA:  Cervicalgia EXAM: MRI CERVICAL SPINE WITHOUT CONTRAST TECHNIQUE: Multiplanar, multisequence MR imaging of the cervical spine was performed. No intravenous contrast was administered. COMPARISON:  Cervical spine CT 07/04/2021 FINDINGS: Alignment: Segmentation anomalies at the cervicothoracic junction contribute to severe levoscoliosis of the upper thoracic spine. Grade 1 anterolisthesis at C3-4 and C4-5. Vertebrae: There is edema at the superior C7 endplate in the region of the fracture identified on the earlier CT. There is also edema at the site of the C6 spinous process fracture. Assessment of the bones is otherwise limited by the degree of motion. Cord: Assessment severely limited by motion. Posterior Fossa, vertebral arteries, paraspinal tissues: Negative. Disc levels: C1-2: Unremarkable. C2-3: No disc herniation. There is no spinal canal stenosis. No neural foraminal stenosis. C3-4: Facet arthrosis, left-greater-than-right, and left foraminal disc protrusion. There is no spinal canal stenosis. Severe left neural foraminal stenosis. C4-5: Severe left facet arthrosis. There is no spinal canal  stenosis. Severe left neural foraminal stenosis. C5-6: No disc herniation, spinal canal stenosis or neural impingement C6-7: No disc herniation, spinal canal stenosis or neural impingement . C7-T1: No disc herniation, spinal canal stenosis or neural  impingement IMPRESSION: 1. Severely motion degraded study. 2. Edema at the site of the superior C7 endplate fracture and C6 spinous process fracture. 3. Severe left C3-4 and C4-5 neural foraminal stenosis secondary to combination of facet arthrosis and left foraminal disc protrusion. 4. Cervicothoracic segmentation anomalies with marked scoliosis. Electronically Signed   By: Ulyses Jarred M.D.   On: 07/10/2021 21:34   DG Chest Port 1 View  Result Date: 07/13/2021 CLINICAL DATA:  88 year old female with history of shortness of breath. EXAM: PORTABLE CHEST 1 VIEW COMPARISON:  Chest x-ray 07/12/2021. FINDINGS: Lung volumes are low. No consolidative airspace disease. No pleural effusions. Widespread interstitial prominence, most evident throughout the mid to lower lungs bilaterally, similar to the prior examination no pneumothorax. No evidence of pulmonary edema. Heart size is normal. Numerous calcified mediastinal and bilateral hilar lymph nodes are incidentally noted. Upper mediastinal contours are distorted by patient positioning. Atherosclerosis in the thoracic aorta. IMPRESSION: 1. Low lung volumes with diffuse interstitial prominence, most evident throughout the mid to lower lungs bilaterally, concerning for interstitial lung disease. Further evaluation with nonemergent high-resolution chest CT is recommended in the near future to better evaluate these findings. 2. Aortic atherosclerosis. Electronically Signed   By: Vinnie Langton M.D.   On: 07/13/2021 06:36   ECHOCARDIOGRAM COMPLETE  Result Date: 07/12/2021    ECHOCARDIOGRAM REPORT   Patient Name:   Lorraine Brandt Date of Exam: 07/12/2021 Medical Rec #:  [id number]           Height:       61.0 in Accession #:    [id number]          Weight:       125.0 lb Date of Birth:  [birth year]          BSA:          1.547 m Patient Age:    58 years            BP:           136/84 mmHg Patient Gender: F                   HR:           71 bpm. Exam Location:   Inpatient Procedure: 2D Echo, Cardiac Doppler and Color Doppler Indications:    CHF  History:        Patient has prior history of Echocardiogram examinations, most                 recent 07/28/2019. CHF; Risk Factors:Hypertension and                 Dyslipidemia.  Sonographer:    Luisa Hart RDCS Referring Phys: [id number] Margaree Mackintosh [place]  1. Prominent basal septal hypertrophy and hypertrophied papilary muscles no mid cavitary gradient recoreded No SAM . Left ventricular ejection fraction, by estimation, is 60 to 65%. The left ventricle has normal function. The left ventricle has no regional wall motion abnormalities. There is severe asymmetric left ventricular hypertrophy of the basal and septal segments. Left ventricular diastolic parameters are consistent with Grade II diastolic dysfunction (pseudonormalization). Elevated left ventricular end-diastolic pressure.  2. Right ventricular systolic function is normal. The right ventricular size is normal.  3. The pericardial  effusion is posterior to the left ventricle.  4. The mitral valve is abnormal. Trivial mitral valve regurgitation. No evidence of mitral stenosis.  5. Small gradient across AV but morphologically opens well . The aortic valve is tricuspid. There is mild calcification of the aortic valve. There is mild thickening of the aortic valve. Aortic valve regurgitation is mild. Aortic valve sclerosis is present, with no evidence of aortic valve stenosis.  6. The inferior vena cava is normal in size with greater than 50% respiratory variability, suggesting right atrial pressure of 3 mmHg. FINDINGS  Left Ventricle: Prominent basal septal hypertrophy and hypertrophied papilary muscles no mid cavitary gradient recoreded No SAM. Left ventricular ejection fraction, by estimation, is 60 to 65%. The left ventricle has normal function. The left ventricle has no regional wall motion abnormalities. The left ventricular internal cavity size was normal in size.  There is severe asymmetric left ventricular hypertrophy of the basal and septal segments. Left ventricular diastolic parameters are consistent with Grade II diastolic dysfunction (pseudonormalization). Elevated left ventricular end-diastolic pressure. Right Ventricle: The right ventricular size is normal. No increase in right ventricular wall thickness. Right ventricular systolic function is normal. Left Atrium: Left atrial size was normal in size. Right Atrium: Right atrial size was normal in size. Pericardium: Trivial pericardial effusion is present. The pericardial effusion is posterior to the left ventricle. Mitral Valve: The mitral valve is abnormal. There is mild thickening of the mitral valve leaflet(s). There is moderate calcification of the mitral valve leaflet(s). Mild mitral annular calcification. Trivial mitral valve regurgitation. No evidence of mitral valve stenosis. MV peak gradient, 4.4 mmHg. The mean mitral valve gradient is 2.0 mmHg. Tricuspid Valve: The tricuspid valve is normal in structure. Tricuspid valve regurgitation is trivial. No evidence of tricuspid stenosis. Aortic Valve: Small gradient across AV but morphologically opens well. The aortic valve is tricuspid. There is mild calcification of the aortic valve. There is mild thickening of the aortic valve. Aortic valve regurgitation is mild. Aortic regurgitation PHT measures 565 msec. Aortic valve sclerosis is present, with no evidence of aortic valve stenosis. Aortic valve mean gradient measures 7.0 mmHg. Aortic valve peak gradient measures 13.3 mmHg. Aortic valve area, by VTI measures 1.78 cm. Pulmonic Valve: The pulmonic valve was normal in structure. Pulmonic valve regurgitation is not visualized. No evidence of pulmonic stenosis. Aorta: The aortic root is normal in size and structure. Venous: The inferior vena cava is normal in size with greater than 50% respiratory variability, suggesting right atrial pressure of 3 mmHg. IAS/Shunts: No  atrial level shunt detected by color flow Doppler.  LEFT VENTRICLE PLAX 2D LVIDd:         3.00 cm     Diastology LVIDs:         1.40 cm     LV e' medial:    4.43 cm/s LV PW:         1.00 cm     LV E/e' medial:  17.2 LV IVS:        1.80 cm     LV e' lateral:   6.30 cm/s LVOT diam:     1.80 cm     LV E/e' lateral: 12.1 LV SV:         57 LV SV Index:   37 LVOT Area:     2.54 cm  LV Volumes (MOD) LV vol d, MOD A2C: 29.0 ml LV vol d, MOD A4C: 22.0 ml LV vol s, MOD A2C: 7.9 ml LV vol s,  MOD A4C: 4.8 ml LV SV MOD A2C:     21.1 ml LV SV MOD A4C:     22.0 ml LV SV MOD BP:      20.8 ml RIGHT VENTRICLE RV Basal diam:  2.60 cm RV Mid diam:    2.60 cm RV S prime:     13.40 cm/s TAPSE (M-mode): 1.6 cm LEFT ATRIUM             Index        RIGHT ATRIUM          Index LA diam:        3.20 cm 2.07 cm/m   RA Area:     8.13 cm LA Vol (A2C):   40.2 ml 25.99 ml/m  RA Volume:   12.60 ml 8.15 ml/m LA Vol (A4C):   39.1 ml 25.28 ml/m LA Biplane Vol: 41.9 ml 27.09 ml/m  AORTIC VALVE                     PULMONIC VALVE AV Area (Vmax):    1.76 cm      PV Vmax:       1.00 m/s AV Area (Vmean):   1.76 cm      PV Vmean:      65.550 cm/s AV Area (VTI):     1.78 cm      PV VTI:        0.196 m AV Vmax:           182.50 cm/s   PV Peak grad:  4.0 mmHg AV Vmean:          120.000 cm/s  PV Mean grad:  2.5 mmHg AV VTI:            0.322 m AV Peak Grad:      13.3 mmHg AV Mean Grad:      7.0 mmHg LVOT Vmax:         126.00 cm/s LVOT Vmean:        83.200 cm/s LVOT VTI:          0.225 m LVOT/AV VTI ratio: 0.70 AI PHT:            565 msec  AORTA Ao Root diam: 2.60 cm Ao Asc diam:  3.20 cm MITRAL VALVE               TRICUSPID VALVE MV Area (PHT): 3.42 cm    TR Peak grad:   33.9 mmHg MV Area VTI:   1.90 cm    TR Vmax:        291.00 cm/s MV Peak grad:  4.4 mmHg MV Mean grad:  2.0 mmHg    SHUNTS MV Vmax:       1.05 m/s    Systemic VTI:  0.22 m MV Vmean:      60.5 cm/s   Systemic Diam: 1.80 cm MV Decel Time: 222 msec MV E velocity: 76.20 cm/s MV A velocity:  82.50 cm/s MV E/A ratio:  0.92 Jenkins Rouge MD Electronically signed by Jenkins Rouge MD Signature Date/Time: 07/12/2021/2:40:27 PM    Final

## 2021-07-13 NOTE — Progress Notes (Signed)
PT Cancellation Note ? ?Patient Details ?Name: Chistina Roston ?MRN: [id number] ?DOB: 1934-02-17 ? ? ?Cancelled Treatment:    Reason Eval/Treat Not Completed: Patient at procedure or test/unavailable ? ?Patient currently eating breakfast. Noted sodium and potassium remain low. Will reattempt eval today as appropriate.  ? ? ?Arby Barrette, PT ?Acute Rehabilitation Services  ?Pager [phone] ?Office [phone] ? ? ?Jeanie Cooks Demichael Traum ?07/13/2021, 9:20 AM ?

## 2021-07-13 NOTE — ED Notes (Signed)
Attempted to give oral report  the purple man was never placed  now the receiving nurse cannot take report  I was told she would call me back ?

## 2021-07-13 NOTE — Progress Notes (Signed)
Admit: 07/12/2021 ?LOS: 1 ? ?70F with new euvolemic hypotonic hyponatremia, likely SIADH ? ?Subjective:  ?Eating bacon and eggs this AM ?No c/o, feels ok, OOB to chair ?SNa up to 116 ?TTE nl LVEF, grade 2 dd  ? ?03/03 0701 - 03/04 0700 ?In: 680.6 [I.V.:130.6; IV Piggyback:550] ?Out: 70 [Urine:70] ? ?Filed Weights  ? 07/12/21 2355 07/13/21 0120  ?Weight: 56.7 kg 60.2 kg  ? ? ?Scheduled Meds: ? diltiazem  300 mg Oral q morning  ? furosemide  20 mg Intravenous BID  ? heparin  5,000 Units Subcutaneous Q8H  ? [START ON 07/15/2021] levothyroxine  125 mcg Oral Once per day on Mon Tue Wed Thu Fri  ? pantoprazole  40 mg Oral Daily  ? predniSONE  5 mg Oral Q breakfast  ? rosuvastatin  20 mg Oral Daily  ? ?Continuous Infusions: ?PRN Meds:.acetaminophen **OR** acetaminophen, bisacodyl, ondansetron **OR** ondansetron (ZOFRAN) IV, traMADol ? ?Current Labs: reviewed  ? Latest Reference Range & Units 07/12/21 12:50  ?Osmolality 275 - 295 mOsm/kg 239 (LL)  ?(LL): Data is critically low ? ? Latest Reference Range & Units 07/12/21 09:45  ?Uric Acid, Serum 2.5 - 7.1 mg/dL 1.7 (L)  ?(L): Data is abnormally low ? Latest Reference Range & Units 07/12/21 10:05  ?Osmolality, Urine 300 - 900 mOsm/kg 455  ?Sodium, Urine mmol/L ?mmol/L 77 ?78  ? ? ? ?Physical Exam:  Blood pressure (!) 133/58, pulse (!) 56, temperature 97.7 ?F (36.5 ?C), temperature source Oral, resp. rate 16, height '5\' 1"'$  (1.549 m), weight 60.2 kg, SpO2 95 %. ?GEN: Elderly female, NAD ?ENT: NCAT, in c-collar ?EYES: EOMI ?CV: Regular, normal S1 and S2 ?PULM: Rhonchorous/crackles in the bases, normal work of breathing ?ABD: Soft, nontender, no masses ?SKIN: No rashes or lesions ?EXT: No edema ?  ? ?A ?Hypotonic, hypovolemic hyponatremia, most consistent with SIADH (high UOsm and UNa, low uric acid.  Likley had some symptoms of falls and confusion leading up to presentation but now is neurologically intact ?Cont with 1L Fluid restriction ?Push high solute foods, esp protein ?Hold  SSRI ?Trend SNa q6h, goal is SNa around 122 in 24h ?Cont lasix 20 BID ?SOB / Cough: TTE with nl LVEF, grade 2 DD.  Would expect UNa lower if CHF was primary driver of #1 ?Chronic Pain ?OA ?Hypothyroidism longstanding ?PMR on chronic corticoseriods ? ?P ?As above ? ? ?Pearson Grippe MD ?07/13/2021, 9:47 AM ? ?Recent Labs  ?Lab 07/12/21 ?0945 07/12/21 ?1005 07/12/21 ?1525 07/12/21 ?1900 07/13/21 ?0201  ?NA 114* 114* 114* 116* 116*  116*  ?K 3.8 3.7  --   --  3.2*  ?CL 82* 80*  --   --  83*  ?CO2 24  --   --   --  27  ?GLUCOSE 86 88  --   --  91  ?BUN 16 19  --   --  12  ?CREATININE 0.46 0.40*  --   --  0.40*  ?CALCIUM 7.9*  --   --   --  7.7*  ? ?Recent Labs  ?Lab 07/12/21 ?0945 07/12/21 ?1005 07/13/21 ?0201  ?WBC 15.5*  --  14.3*  ?NEUTROABS 12.5*  --  11.9*  ?HGB 13.5 13.6 12.6  ?HCT 36.5 40.0 33.9*  ?MCV 89.5  --  89.4  ?PLT 268  --  254  ? ? ? ? ? ? ? ? ? ?  ?

## 2021-07-14 ENCOUNTER — Inpatient Hospital Stay (HOSPITAL_COMMUNITY): Payer: Medicare Other

## 2021-07-14 DIAGNOSIS — E871 Hypo-osmolality and hyponatremia: Secondary | ICD-10-CM | POA: Diagnosis not present

## 2021-07-14 LAB — CBC WITH DIFFERENTIAL/PLATELET
Abs Immature Granulocytes: 0.38 10*3/uL — ABNORMAL HIGH (ref 0.00–0.07)
Basophils Absolute: 0 10*3/uL (ref 0.0–0.1)
Basophils Relative: 0 %
Eosinophils Absolute: 0 10*3/uL (ref 0.0–0.5)
Eosinophils Relative: 0 %
HCT: 32.4 % — ABNORMAL LOW (ref 36.0–46.0)
Hemoglobin: 12 g/dL (ref 12.0–15.0)
Immature Granulocytes: 3 %
Lymphocytes Relative: 5 %
Lymphs Abs: 0.6 10*3/uL — ABNORMAL LOW (ref 0.7–4.0)
MCH: 33.1 pg (ref 26.0–34.0)
MCHC: 37 g/dL — ABNORMAL HIGH (ref 30.0–36.0)
MCV: 89.5 fL (ref 80.0–100.0)
Monocytes Absolute: 0.5 10*3/uL (ref 0.1–1.0)
Monocytes Relative: 4 %
Neutro Abs: 10.7 10*3/uL — ABNORMAL HIGH (ref 1.7–7.7)
Neutrophils Relative %: 88 %
Platelets: 240 10*3/uL (ref 150–400)
RBC: 3.62 MIL/uL — ABNORMAL LOW (ref 3.87–5.11)
RDW: 15.2 % (ref 11.5–15.5)
WBC: 12.2 10*3/uL — ABNORMAL HIGH (ref 4.0–10.5)
nRBC: 0 % (ref 0.0–0.2)

## 2021-07-14 LAB — COMPREHENSIVE METABOLIC PANEL
ALT: 25 U/L (ref 0–44)
AST: 17 U/L (ref 15–41)
Albumin: 2.8 g/dL — ABNORMAL LOW (ref 3.5–5.0)
Alkaline Phosphatase: 86 U/L (ref 38–126)
Anion gap: 6 (ref 5–15)
BUN: 12 mg/dL (ref 8–23)
CO2: 25 mmol/L (ref 22–32)
Calcium: 7.6 mg/dL — ABNORMAL LOW (ref 8.9–10.3)
Chloride: 89 mmol/L — ABNORMAL LOW (ref 98–111)
Creatinine, Ser: 0.48 mg/dL (ref 0.44–1.00)
GFR, Estimated: >60 mL/min (ref >60)
Glucose, Bld: 100 mg/dL — ABNORMAL HIGH (ref 70–99)
Potassium: 4.1 mmol/L (ref 3.5–5.1)
Sodium: 120 mmol/L — ABNORMAL LOW (ref 135–145)
Total Bilirubin: 0.5 mg/dL (ref 0.3–1.2)
Total Protein: 5 g/dL — ABNORMAL LOW (ref 6.5–8.1)

## 2021-07-14 LAB — TSH: TSH: 0.329 u[IU]/mL — ABNORMAL LOW (ref 0.350–4.500)

## 2021-07-14 LAB — MAGNESIUM: Magnesium: 2.2 mg/dL (ref 1.7–2.4)

## 2021-07-14 LAB — SODIUM
Sodium: 117 mmol/L — CL (ref 135–145)
Sodium: 123 mmol/L — ABNORMAL LOW (ref 135–145)
Sodium: 123 mmol/L — ABNORMAL LOW (ref 135–145)
Sodium: 122 mmol/L — ABNORMAL LOW (ref 135–145)
Sodium: 123 mmol/L — ABNORMAL LOW (ref 135–145)

## 2021-07-14 LAB — PROCALCITONIN: Procalcitonin: <0.1 ng/mL

## 2021-07-14 LAB — BRAIN NATRIURETIC PEPTIDE: B Natriuretic Peptide: 136 pg/mL — ABNORMAL HIGH (ref 0.0–100.0)

## 2021-07-14 LAB — C-REACTIVE PROTEIN: CRP: 1.8 mg/dL — ABNORMAL HIGH (ref <1.0)

## 2021-07-14 LAB — SODIUM, URINE, RANDOM: Sodium, Ur: 45 mmol/L

## 2021-07-14 MED ORDER — UREA 15 G PO PACK
15.0000 g | PACK | Freq: Two times a day (BID) | ORAL | Status: DC
  Administered 2021-07-14 – 2021-07-18 (×10): 15 g via ORAL
  Filled 2021-07-14 (×10): qty 1

## 2021-07-14 MED ORDER — HYDROCERIN EX CREA
TOPICAL_CREAM | Freq: Every day | CUTANEOUS | Status: DC
  Filled 2021-07-14: qty 113

## 2021-07-14 NOTE — Progress Notes (Signed)
PROGRESS NOTE                                                                                                                                                                                                             Patient Demographics:    Lorraine Brandt, is a 88 y.o. female, DOB - 10/01/1933, GBE:[id number]  Outpatient Primary MD for the patient is Reynold Bowen, MD    LOS - 2  Admit date - 07/12/2021    Chief Complaint  Patient presents with   Altered Mental Status       Brief Narrative (HPI from H&P)     Lorraine Brandt  is a 88 y.o. female, with history of osteoarthritis, hypothyroidism, hypertension, polymyalgia rheumatica, dyslipidemia who has been gradually getting weaker over the last few days, also has developed problems laying flat as she gets short of breath, she sustained 2 falls over the last 2 weeks and one of the falls she hurt her neck, she was diagnosed with C6-C7 fracture she saw neurosurgery outpatient and was placed in a c-collar.  She was also placed on a steroid taper.  She continues to get weak and presented to the ER today where she was found to have severe hyponatremia.   Subjective:   Patient in bed, appears comfortable, denies any headache, no fever, no chest pain or pressure, no shortness of breath , no abdominal pain. No focal weakness.   Assessment  & Plan :     1.  Generalized weakness, falls at home due to severe hyponatremia.  Patient appears quite frail and deconditioned, due to her falls she has  sustained a C6-C7 fracture as well -she has neck pain was on Lexapro which could cause SIADH, urine sodium is 77 and urine osmolality is much higher than serum.  Nephrology is following the patient restarted her on 3% normal saline she  is also on twice daily Lasix, sodium has come up will defer further management to nephrology for this issue.     2.  Acute on chronic diastolic CHF last EF 61%.  Echo noted ++ d CHF, BNP high, has orthopnea, SOB on exertion and crackles on exam, chest x-ray also shows fluid overload, continue diuresis as above.  If blood pressure allows will try to continue low-dose beta-blocker.  Chest x-ray shows some nonspecific findings for which noncontrast CT will be obtained.   3.  Recent fall at home with C7 endplate and C6 vertebral body fracture.  She has seen neurosurgery in the outpatient setting currently had a c-collar which she has to wear at all times till she sees neurosurgery and they take it off.  She is finishing her prednisone taper in the next few days.   4.  Hypertension.  For now low-dose Coreg and monitor we will avoid ACE and ARB till fluid status is stabilized.   5.  Osteoarthritis.  Supportive care.   6.  Dyslipidemia.  On statin.   7.  Depression.  Will hold Lexapro until sodium levels stabilize.      Condition - Fair  Family Communication  :  daughter bedside 07/12/21, 07/14/2021  Code Status :  Full  Consults  :  Renal  PUD Prophylaxis : PPI   Procedures  :     TTE - 1. Prominent basal septal hypertrophy and hypertrophied papilary muscles no mid cavitary gradient recoreded No SAM . Left ventricular ejection fraction, by estimation, is 60 to 65%. The left ventricle has normal function. The left ventricle has no regional wall motion abnormalities. There is severe asymmetric left ventricular hypertrophy of the basal and septal segments. Left ventricular diastolic parameters are consistent with Grade II diastolic dysfunction (pseudonormalization). Elevated left ventricular end-diastolic pressure.  2. Right ventricular systolic function is normal. The right ventricular size is normal.  3. The pericardial effusion is posterior to the left ventricle.  4. The mitral valve is abnormal.  Trivial mitral valve regurgitation. No evidence of mitral stenosis.  5. Small gradient across AV but morphologically opens well . The aortic valve is tricuspid. There is mild calcification of the aortic valve. There is mild thickening of the aortic valve. Aortic valve regurgitation is mild. Aortic valve sclerosis is present, with no evidence of aortic valve stenosis.  6. The inferior vena cava is normal in size with greater than 50% respiratory variability, suggesting right atrial pressure of 3 mmHg.      Disposition Plan  :    Status is: Inpatient  DVT Prophylaxis  :    Place TED hose Start: 07/13/21 1028 heparin injection 5,000 Units Start: 07/12/21 1400  Lab Results  Component Value Date   PLT 240 07/14/2021    Diet :  Diet Order             Diet regular Room service appropriate? Yes with Assist; Fluid consistency: Thin; Fluid restriction: Other (see comments)  Diet effective now                    Inpatient Medications  Scheduled Meds:  carvedilol  3.125 mg Oral BID WC   diltiazem  180 mg Oral q morning  furosemide  20 mg Intravenous BID   heparin  5,000 Units Subcutaneous Q8H   hydrocerin   Topical Daily   [START ON 07/15/2021] levothyroxine  125 mcg Oral Once per day on Mon Tue Wed Thu Fri   pantoprazole  40 mg Oral Daily   predniSONE  5 mg Oral Q breakfast   rosuvastatin  20 mg Oral Daily   Continuous Infusions:  sodium chloride (hypertonic) 25 mL/hr at 07/14/21 0000   PRN Meds:.acetaminophen **OR** acetaminophen, bisacodyl, dextromethorphan-guaiFENesin, ondansetron **OR** ondansetron (ZOFRAN) IV, traMADol  Antibiotics  :    Anti-infectives (From admission, onward)    None        Time Spent in minutes  30   Lala Lund M.D on 07/14/2021 at 10:10 AM  To page go to [email or web address]   [facility] -  Office  [phone]  See all Orders from today for further details    Objective:   Vitals:   07/13/21 2312 07/14/21 0000 07/14/21 0400  07/14/21 0827  BP: (!) 128/41 (!) 119/56 125/60 (!) 148/65  Pulse: 62 67 68 69  Resp: '15 12 14 19  '$ Temp: 97.8 F (36.6 C)   98 F (36.7 C)  TempSrc: Oral   Oral  SpO2: 98% 97% 97% 95%  Weight:      Height:        Wt Readings from Last 3 Encounters:  07/13/21 60.2 kg  07/03/21 56.7 kg  07/28/19 58.5 kg     Intake/Output Summary (Last 24 hours) at 07/14/2021 1010 Last data filed at 07/14/2021 0400 Gross per 24 hour  Intake 358.84 ml  Output 875 ml  Net -516.16 ml     Physical Exam  Awake Alert, No new F.N deficits, Normal affect .AT,PERRAL C-collar in place Symmetrical Chest wall movement, Good air movement bilaterally, coarse bibasilar breath sounds RRR,No Gallops, Rubs or new Murmurs,  +ve B.Sounds, Abd Soft, No tenderness,   No Cyanosis, Clubbing or edema     Data Review:    CBC Recent Labs  Lab 07/12/21 0945 07/12/21 1005 07/13/21 0201 07/14/21 0420  WBC 15.5*  --  14.3* 12.2*  HGB 13.5 13.6 12.6 12.0  HCT 36.5 40.0 33.9* 32.4*  PLT 268  --  254 240  MCV 89.5  --  89.4 89.5  MCH 33.1  --  33.2 33.1  MCHC 37.0*  --  37.2* 37.0*  RDW 14.9  --  15.0 15.2  LYMPHSABS 1.4  --  1.2 0.6*  MONOABS 1.0  --  0.6 0.5  EOSABS 0.1  --  0.1 0.0  BASOSABS 0.0  --  0.0 0.0    Recent Labs  Lab 07/12/21 0945 07/12/21 1005 07/12/21 1250 07/12/21 1525 07/13/21 0201 07/13/21 1440 07/13/21 1807 07/13/21 2102 07/14/21 0035 07/14/21 0042 07/14/21 0420 07/14/21 0735  NA 114* 114*  --    < > 116*   116*   < > 110* 113* 117*  --  120* 123*  K 3.8 3.7  --   --  3.2*  --   --   --   --   --  4.1  --   CL 82* 80*  --   --  83*  --   --   --   --   --  89*  --   CO2 24  --   --   --  27  --   --   --   --   --  25  --  GLUCOSE 86 88  --   --  91  --   --   --   --   --  100*  --   BUN 16 19  --   --  12  --   --   --   --   --  12  --   CREATININE 0.46 0.40*  --   --  0.40*  --   --   --   --   --  0.48  --   CALCIUM 7.9*  --   --   --  7.7*  --   --   --   --    --  7.6*  --   AST 27  --   --   --  19  --   --   --   --   --  17  --   ALT 25  --   --   --  25  --   --   --   --   --  25  --   ALKPHOS 73  --   --   --  79  --   --   --   --   --  86  --   BILITOT 1.3*  --   --   --  1.0  --   --   --   --   --  0.5  --   ALBUMIN 3.3*  --   --   --  2.9*  --   --   --   --   --  2.8*  --   MG 1.8  --   --   --  1.7  --   --   --   --   --  2.2  --   CRP  --   --  <0.5  --  0.5  --   --   --   --   --  1.8*  --   PROCALCITON  --   --   --   --  <0.10   <0.10  --   --   --   --   --  <0.10  --   TSH  --   --   --   --   --   --   --   --   --  0.329*  --   --   BNP 204.3*  --   --   --  221.1*  --   --   --   --   --  136.0*  --    < > = values in this interval not displayed.    ------------------------------------------------------------------------------------------------------------------ No results for input(s): CHOL, HDL, LDLCALC, TRIG, CHOLHDL, LDLDIRECT in the last 72 hours.  No results found for: HGBA1C ------------------------------------------------------------------------------------------------------------------ Recent Labs    07/14/21 0042  TSH 0.329*    Cardiac Enzymes No results for input(s): CKMB, TROPONINI, MYOGLOBIN in the last 168 hours.  Invalid input(s): CK ------------------------------------------------------------------------------------------------------------------    Component Value Date/Time   BNP 136.0 (H) 07/14/2021 0420   Radiology Reports DG Chest 2 View  Result Date: 07/12/2021 CLINICAL DATA:  Pt brought from home for c/o intermittent AMS, increased general weakness and decreased mobility. Pt has non productive cough. Pt denies any CP but states she has been feeling SOB x a few days Nonsmoker Hx of HTN, cancer, GERD, EXAM: CHEST - 2 VIEW COMPARISON:  04/19/2014 FINDINGS: Progressive development of  coarse peripheral interstitial opacities, worse left than right, involving bases more than apices. Heart size  upper limits normal.  Tortuous ectatic thoracic aorta. No effusion. Levoscoliosis in the upper thoracic spine with suspected vertebral segmentation anomaly, incompletely characterized. IMPRESSION: 1. Coarse asymmetric interstitial opacities left greater than right, suggesting progressive chronic interstitial lung disease. Cannot exclude superimposed interstitial infiltrate or asymmetric edema. Electronically Signed   By: Lucrezia Europe M.D.   On: 07/12/2021 10:10   DG Lumbar Spine Complete  Result Date: 07/03/2021 CLINICAL DATA:  Status post fall with lower back pain. EXAM: LUMBAR SPINE - COMPLETE 4+ VIEW COMPARISON:  February 25, 2021 FINDINGS: There is no evidence of acute lumbar spine fracture. Mild dextroscoliosis is seen with stable grade 1 anterolisthesis of the L4 vertebral body on L5. Mild to moderate severity multilevel endplate sclerosis is seen. Stable marked severity intervertebral disc space narrowing is noted at the level of L4-L5. IMPRESSION: 1. No evidence of acute lumbar spine fracture. 2. Stable marked severity degenerative disc disease at L4-L5 with stable grade 1 anterolisthesis of the L4 vertebral body. Electronically Signed   By: Virgina Norfolk M.D.   On: 07/03/2021 02:47   CT Head Wo Contrast  Result Date: 07/04/2021 CLINICAL DATA:  Trauma EXAM: CT HEAD WITHOUT CONTRAST TECHNIQUE: Contiguous axial images were obtained from the base of the skull through the vertex without intravenous contrast. RADIATION DOSE REDUCTION: This exam was performed according to the departmental dose-optimization program which includes automated exposure control, adjustment of the mA and/or kV according to patient size and/or use of iterative reconstruction technique. COMPARISON:  07/03/2021 FINDINGS: Brain: No acute intracranial findings are seen. There are no signs of intracranial bleeding. There are no epidural or subdural fluid collections. Cortical sulci are prominent. There is decreased density in the  periventricular white matter. Vascular: Unremarkable. Skull: No fracture is seen in the calvarium. There is subcutaneous contusion/hematoma in the posterior parietal scalp. Sinuses/Orbits: There are no air-fluid levels. Other: None IMPRESSION: No acute intracranial findings are seen in noncontrast CT brain. There is subcutaneous contusion/hematoma in the parietal scalp. No fracture is seen in the calvarium. Electronically Signed   By: Elmer Picker M.D.   On: 07/04/2021 17:47   CT Head Wo Contrast  Result Date: 07/03/2021 CLINICAL DATA:  Recent slip and fall with headaches and neck pain, initial encounter EXAM: CT HEAD WITHOUT CONTRAST CT CERVICAL SPINE WITHOUT CONTRAST TECHNIQUE: Multidetector CT imaging of the head and cervical spine was performed following the standard protocol without intravenous contrast. Multiplanar CT image reconstructions of the cervical spine were also generated. RADIATION DOSE REDUCTION: This exam was performed according to the departmental dose-optimization program which includes automated exposure control, adjustment of the mA and/or kV according to patient size and/or use of iterative reconstruction technique. COMPARISON:  04/18/2019 FINDINGS: CT HEAD FINDINGS Brain: No evidence of acute infarction, hemorrhage, hydrocephalus, extra-axial collection or mass lesion/mass effect. Chronic atrophic and ischemic changes are noted. Vascular: No hyperdense vessel or unexpected calcification. Skull: Normal. Negative for fracture or focal lesion. Sinuses/Orbits: No acute finding. Other: None. CT CERVICAL SPINE FINDINGS Alignment: Loss of the normal cervical lordosis is noted. Anterolisthesis of C4 on C5 is seen felt to be degenerative in nature. Skull base and vertebrae: 7 cervical segments are well visualized. Vertebral body height is well maintained. At least partial fusion at C5-6 is noted. Anterolisthesis of C4 on C5 is noted of a degenerative nature. No acute fracture or acute facet  abnormality is noted. Multilevel facet hypertrophic changes  are seen. Significant scoliosis in the upper thoracic spine is noted concave to the left. There are findings suspicious for hemi vertebra between C6 and T1 vertebra on the left. Soft tissues and spinal canal: Surrounding soft tissue structures show vascular calcifications. Upper chest: Visualized lung apices are within normal limits. Other: None IMPRESSION: CT of the head: Chronic atrophic and ischemic changes. CT of the cervical spine: Multilevel degenerative change. No acute abnormality noted. 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 Electronically Signed   By: Inez Catalina M.D.   On: 07/03/2021 04:02   CT Cervical Spine Wo Contrast  Addendum Date: 07/04/2021   ADDENDUM REPORT: 07/04/2021 17:44 ADDENDUM: Imaging findings were relayed to patient's provider Theodis Blaze by telephone call. Electronically Signed   By: Elmer Picker M.D.   On: 07/04/2021 17:44   Result Date: 07/04/2021 CLINICAL DATA:  Trauma EXAM: CT CERVICAL SPINE WITHOUT CONTRAST TECHNIQUE: Multidetector CT imaging of the cervical spine was performed without intravenous contrast. Multiplanar CT image reconstructions were also generated. RADIATION DOSE REDUCTION: This exam was performed according to the departmental dose-optimization program which includes automated exposure control, adjustment of the mA and/or kV according to patient size and/or use of iterative reconstruction technique. COMPARISON:  07/03/2021 FINDINGS: Alignment: There is minimal anterolisthesis at C4-C5 level suggesting previous ligament injury and facet degeneration. There is congenital partial fusion of bodies of C5 and C6 vertebrae. There is a radiolucent line in the right lamina of C6 vertebra. There is radiolucent line in the left anterior superior C7 vertebra. There is congenital partial fusion of bodies of T1 and T2 vertebrae. 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 Skull base and vertebrae: Degenerative changes are noted with bony spurs and facet hypertrophy at multiple levels in the cervical spine. Soft tissues and spinal canal: There is no significant central spinal stenosis. Disc levels: There is encroachment of neural foramina from C2-C7 levels. Upper chest: Unremarkable. Other: Thyroid is not seen suggesting atrophy or previous removal. IMPRESSION: There is radiolucent line in the right lamina of C6 vertebra. There is radiolucent line in the anterior superior left side body C7 vertebra. Possibility of recent fractures is not excluded. Follow-up MRI as clinically warranted should be considered. Cervical spondylosis with encroachment of neural foramina at multiple levels. Congenital deformity is seen in the lower cervical and upper thoracic spine. Other findings as described in the body of the report. Electronically Signed: By: Elmer Picker M.D. On: 07/04/2021 17:40   CT Cervical Spine Wo Contrast  Result Date: 07/03/2021 CLINICAL DATA:  Recent slip and fall with headaches and neck pain, initial encounter EXAM: CT HEAD WITHOUT CONTRAST CT CERVICAL SPINE WITHOUT CONTRAST TECHNIQUE: Multidetector CT imaging of the head and cervical spine was performed following the standard protocol without intravenous contrast. Multiplanar CT image reconstructions of the cervical spine were also generated. RADIATION DOSE REDUCTION: This exam was performed according to the departmental dose-optimization program which includes automated exposure control, adjustment of the mA and/or kV according to patient size and/or use of iterative reconstruction technique. COMPARISON:  04/18/2019 FINDINGS: CT HEAD FINDINGS Brain: No evidence of acute infarction, hemorrhage, hydrocephalus, extra-axial collection or mass lesion/mass effect. Chronic atrophic and ischemic changes are noted. Vascular: No hyperdense vessel or unexpected calcification. Skull: Normal. Negative for fracture or focal  lesion. Sinuses/Orbits: No acute finding. Other: None. CT CERVICAL SPINE FINDINGS Alignment: Loss of the normal cervical lordosis is noted. Anterolisthesis of C4 on C5 is seen felt to  be degenerative in nature. Skull base and vertebrae: 7 cervical segments are well visualized. Vertebral body height is well maintained. At least partial fusion at C5-6 is noted. Anterolisthesis of C4 on C5 is noted of a degenerative nature. No acute fracture or acute facet abnormality is noted. Multilevel facet hypertrophic changes are seen. Significant scoliosis in the upper thoracic spine is noted concave to the left. There are findings suspicious for hemi vertebra between C6 and T1 vertebra on the left. Soft tissues and spinal canal: Surrounding soft tissue structures show vascular calcifications. Upper chest: Visualized lung apices are within normal limits. Other: None IMPRESSION: CT of the head: Chronic atrophic and ischemic changes. CT of the cervical spine: Multilevel degenerative change. No acute abnormality noted. 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 Electronically Signed   By: Inez Catalina M.D.   On: 07/03/2021 04:02   MR CERVICAL SPINE WO CONTRAST  Result Date: 07/10/2021 CLINICAL DATA:  Cervicalgia EXAM: MRI CERVICAL SPINE WITHOUT CONTRAST TECHNIQUE: Multiplanar, multisequence MR imaging of the cervical spine was performed. No intravenous contrast was administered. COMPARISON:  Cervical spine CT 07/04/2021 FINDINGS: Alignment: Segmentation anomalies at the cervicothoracic junction contribute to severe levoscoliosis of the upper thoracic spine. Grade 1 anterolisthesis at C3-4 and C4-5. Vertebrae: There is edema at the superior C7 endplate in the region of the fracture identified on the earlier CT. There is also edema at the site of the C6 spinous process fracture. Assessment of the bones is otherwise limited by the degree of motion. Cord: Assessment severely limited by motion. Posterior Fossa, vertebral arteries,  paraspinal tissues: Negative. Disc levels: C1-2: Unremarkable. C2-3: No disc herniation. There is no spinal canal stenosis. No neural foraminal stenosis. C3-4: Facet arthrosis, left-greater-than-right, and left foraminal disc protrusion. There is no spinal canal stenosis. Severe left neural foraminal stenosis. C4-5: Severe left facet arthrosis. There is no spinal canal stenosis. Severe left neural foraminal stenosis. C5-6: No disc herniation, spinal canal stenosis or neural impingement C6-7: No disc herniation, spinal canal stenosis or neural impingement . C7-T1: No disc herniation, spinal canal stenosis or neural impingement IMPRESSION: 1. Severely motion degraded study. 2. Edema at the site of the superior C7 endplate fracture and C6 spinous process fracture. 3. Severe left C3-4 and C4-5 neural foraminal stenosis secondary to combination of facet arthrosis and left foraminal disc protrusion. 4. Cervicothoracic segmentation anomalies with marked scoliosis. Electronically Signed   By: Ulyses Jarred M.D.   On: 07/10/2021 21:34   DG Chest Port 1 View  Result Date: 07/13/2021 CLINICAL DATA:  88 year old female with history of shortness of breath. EXAM: PORTABLE CHEST 1 VIEW COMPARISON:  Chest x-ray 07/12/2021. FINDINGS: Lung volumes are low. No consolidative airspace disease. No pleural effusions. Widespread interstitial prominence, most evident throughout the mid to lower lungs bilaterally, similar to the prior examination no pneumothorax. No evidence of pulmonary edema. Heart size is normal. Numerous calcified mediastinal and bilateral hilar lymph nodes are incidentally noted. Upper mediastinal contours are distorted by patient positioning. Atherosclerosis in the thoracic aorta. IMPRESSION: 1. Low lung volumes with diffuse interstitial prominence, most evident throughout the mid to lower lungs bilaterally, concerning for interstitial lung disease. Further evaluation with nonemergent high-resolution chest CT is  recommended in the near future to better evaluate these findings. 2. Aortic atherosclerosis. Electronically Signed   By: Vinnie Langton M.D.   On: 07/13/2021 06:36   ECHOCARDIOGRAM COMPLETE  Result Date: 07/12/2021    ECHOCARDIOGRAM REPORT   Patient Name:   Lorraine Brandt  Date of Exam: 07/12/2021 Medical Rec #:  [id number]           Height:       61.0 in Accession #:    [id number]          Weight:       125.0 lb Date of Birth:  13-Nov-1933          BSA:          1.547 m Patient Age:    60 years            BP:           136/84 mmHg Patient Gender: F                   HR:           71 bpm. Exam Location:  Inpatient Procedure: 2D Echo, Cardiac Doppler and Color Doppler Indications:    CHF  History:        Patient has prior history of Echocardiogram examinations, most                 recent 07/28/2019. CHF; Risk Factors:Hypertension and                 Dyslipidemia.  Sonographer:    Luisa Hart RDCS Referring Phys: [id number] Margaree Mackintosh [place]  1. Prominent basal septal hypertrophy and hypertrophied papilary muscles no mid cavitary gradient recoreded No SAM . Left ventricular ejection fraction, by estimation, is 60 to 65%. The left ventricle has normal function. The left ventricle has no regional wall motion abnormalities. There is severe asymmetric left ventricular hypertrophy of the basal and septal segments. Left ventricular diastolic parameters are consistent with Grade II diastolic dysfunction (pseudonormalization). Elevated left ventricular end-diastolic pressure.  2. Right ventricular systolic function is normal. The right ventricular size is normal.  3. The pericardial effusion is posterior to the left ventricle.  4. The mitral valve is abnormal. Trivial mitral valve regurgitation. No evidence of mitral stenosis.  5. Small gradient across AV but morphologically opens well . The aortic valve is tricuspid. There is mild calcification of the aortic valve. There is mild thickening of the aortic valve.  Aortic valve regurgitation is mild. Aortic valve sclerosis is present, with no evidence of aortic valve stenosis.  6. The inferior vena cava is normal in size with greater than 50% respiratory variability, suggesting right atrial pressure of 3 mmHg. FINDINGS  Left Ventricle: Prominent basal septal hypertrophy and hypertrophied papilary muscles no mid cavitary gradient recoreded No SAM. Left ventricular ejection fraction, by estimation, is 60 to 65%. The left ventricle has normal function. The left ventricle has no regional wall motion abnormalities. The left ventricular internal cavity size was normal in size. There is severe asymmetric left ventricular hypertrophy of the basal and septal segments. Left ventricular diastolic parameters are consistent with Grade II diastolic dysfunction (pseudonormalization). Elevated left ventricular end-diastolic pressure. Right Ventricle: The right ventricular size is normal. No increase in right ventricular wall thickness. Right ventricular systolic function is normal. Left Atrium: Left atrial size was normal in size. Right Atrium: Right atrial size was normal in size. Pericardium: Trivial pericardial effusion is present. The pericardial effusion is posterior to the left ventricle. Mitral Valve: The mitral valve is abnormal. There is mild thickening of the mitral valve leaflet(s). There is moderate calcification of the mitral valve leaflet(s). Mild mitral annular calcification. Trivial mitral valve regurgitation. No evidence of mitral valve stenosis. MV peak gradient, 4.4 mmHg.  The mean mitral valve gradient is 2.0 mmHg. Tricuspid Valve: The tricuspid valve is normal in structure. Tricuspid valve regurgitation is trivial. No evidence of tricuspid stenosis. Aortic Valve: Small gradient across AV but morphologically opens well. The aortic valve is tricuspid. There is mild calcification of the aortic valve. There is mild thickening of the aortic valve. Aortic valve regurgitation is  mild. Aortic regurgitation PHT measures 565 msec. Aortic valve sclerosis is present, with no evidence of aortic valve stenosis. Aortic valve mean gradient measures 7.0 mmHg. Aortic valve peak gradient measures 13.3 mmHg. Aortic valve area, by VTI measures 1.78 cm. Pulmonic Valve: The pulmonic valve was normal in structure. Pulmonic valve regurgitation is not visualized. No evidence of pulmonic stenosis. Aorta: The aortic root is normal in size and structure. Venous: The inferior vena cava is normal in size with greater than 50% respiratory variability, suggesting right atrial pressure of 3 mmHg. IAS/Shunts: No atrial level shunt detected by color flow Doppler.  LEFT VENTRICLE PLAX 2D LVIDd:         3.00 cm     Diastology LVIDs:         1.40 cm     LV e' medial:    4.43 cm/s LV PW:         1.00 cm     LV E/e' medial:  17.2 LV IVS:        1.80 cm     LV e' lateral:   6.30 cm/s LVOT diam:     1.80 cm     LV E/e' lateral: 12.1 LV SV:         57 LV SV Index:   37 LVOT Area:     2.54 cm  LV Volumes (MOD) LV vol d, MOD A2C: 29.0 ml LV vol d, MOD A4C: 22.0 ml LV vol s, MOD A2C: 7.9 ml LV vol s, MOD A4C: 4.8 ml LV SV MOD A2C:     21.1 ml LV SV MOD A4C:     22.0 ml LV SV MOD BP:      20.8 ml RIGHT VENTRICLE RV Basal diam:  2.60 cm RV Mid diam:    2.60 cm RV S prime:     13.40 cm/s TAPSE (M-mode): 1.6 cm LEFT ATRIUM             Index        RIGHT ATRIUM          Index LA diam:        3.20 cm 2.07 cm/m   RA Area:     8.13 cm LA Vol (A2C):   40.2 ml 25.99 ml/m  RA Volume:   12.60 ml 8.15 ml/m LA Vol (A4C):   39.1 ml 25.28 ml/m LA Biplane Vol: 41.9 ml 27.09 ml/m  AORTIC VALVE                     PULMONIC VALVE AV Area (Vmax):    1.76 cm      PV Vmax:       1.00 m/s AV Area (Vmean):   1.76 cm      PV Vmean:      65.550 cm/s AV Area (VTI):     1.78 cm      PV VTI:        0.196 m AV Vmax:           182.50 cm/s   PV Peak grad:  4.0 mmHg AV Vmean:  120.000 cm/s  PV Mean grad:  2.5 mmHg AV VTI:            0.322 m AV  Peak Grad:      13.3 mmHg AV Mean Grad:      7.0 mmHg LVOT Vmax:         126.00 cm/s LVOT Vmean:        83.200 cm/s LVOT VTI:          0.225 m LVOT/AV VTI ratio: 0.70 AI PHT:            565 msec  AORTA Ao Root diam: 2.60 cm Ao Asc diam:  3.20 cm MITRAL VALVE               TRICUSPID VALVE MV Area (PHT): 3.42 cm    TR Peak grad:   33.9 mmHg MV Area VTI:   1.90 cm    TR Vmax:        291.00 cm/s MV Peak grad:  4.4 mmHg MV Mean grad:  2.0 mmHg    SHUNTS MV Vmax:       1.05 m/s    Systemic VTI:  0.22 m MV Vmean:      60.5 cm/s   Systemic Diam: 1.80 cm MV Decel Time: 222 msec MV E velocity: 76.20 cm/s MV A velocity: 82.50 cm/s MV E/A ratio:  0.92 Jenkins Rouge MD Electronically signed by Jenkins Rouge MD Signature Date/Time: 07/12/2021/2:40:27 PM    Final

## 2021-07-14 NOTE — Progress Notes (Signed)
Speech Language Pathology Treatment: Dysphagia  ?Patient Details ?Name: Lorraine Brandt ?MRN: [id number] ?DOB: Sep 17, 1933 ?Today's Date: 07/14/2021 ?Time: 1130-1141 ?SLP Time Calculation (min) (ACUTE ONLY): 11 min ? ?Assessment / Plan / Recommendation ?Clinical Impression ? Pt upright in chair upon SLP arrival. She is wearing cervical collar, but is able to self feed without difficulty. She denies any coughing or "strangling" episodes. She was seen with thin liquids and regular solids without difficulty. CXR remains without concern for PNA. At this time, no further ST intervention is indicated. She should continue with thin liquids and regular solids following universal aspiration precautions. ?  ?HPI HPI: Pt is an 88 y.o. female who presented with AMS. Per H&P, pt has been gradually getting weaker, has been diagnosed with C6-C7 fracture s/p fall and was placed in a C-collar by outpatient neurosurgery and started on a steroid taper. CXR on admission: Coarse asymmetric interstitial opacities left greater than right,suggesting progressive chronic interstitial lung disease. PMH: osteoarthritis, hypothyroidism, hypertension, polymyalgia rheumatica, dyslipidemia. ?  ?   ?SLP Plan ? All goals met ? ?  ?  ?Recommendations for follow up therapy are one component of a multi-disciplinary discharge planning process, led by the attending physician.  Recommendations may be updated based on patient status, additional functional criteria and insurance authorization. ?  ? ?Recommendations  ?Diet recommendations: Regular;Thin liquid  ?   ?    ?   ? ? ? ? Follow Up Recommendations: No SLP follow up ?SLP Visit Diagnosis: Dysphagia, unspecified (R13.10) ?Plan: All goals met ? ? ? ? ?  ?  ? ?Skylyn Slezak P. Loryn Haacke, M.S., CCC-SLP ?Speech-Language Pathologist ?[facility]ute [facility] Services ?Pager: [phone] ? ?[place] ? ?07/14/2021, 11:45 AM ?

## 2021-07-14 NOTE — Progress Notes (Signed)
Admit: 07/12/2021 ?LOS: 2 ? ?16F with new euvolemic hypotonic hyponatremia, likely SIADH ? ?Subjective:  ?SNa dropped to 110 yesterday PM ?Started 3% saline 127m bolus-->257mhEating  ?SNa slowly increased back up to 123 this AM ?No c/o, feels ok, ?No confusion, N/V ?On RA, no LEE ?TTE nl LVEF, grade 2 dd  ?Rpt UOsm and UNa remains consistent with SIADH ? ?03/04 0701 - 03/05 0700 ?In: 358.8 [P.O.:240; I.V.:118.8] ?Out: 87BentleyUrine:875] ? ?Filed Weights  ? 07/12/21 0950933/04/23 0120  ?Weight: 56.7 kg 60.2 kg  ? ? ?Scheduled Meds: ? carvedilol  3.125 mg Oral BID WC  ? diltiazem  180 mg Oral q morning  ? furosemide  20 mg Intravenous BID  ? heparin  5,000 Units Subcutaneous Q8H  ? hydrocerin   Topical Daily  ? [START ON 07/15/2021] levothyroxine  125 mcg Oral Once per day on Mon Tue Wed Thu Fri  ? pantoprazole  40 mg Oral Daily  ? predniSONE  5 mg Oral Q breakfast  ? rosuvastatin  20 mg Oral Daily  ? ?Continuous Infusions: ? sodium chloride (hypertonic) 25 mL/hr at 07/14/21 0000  ? ?PRN Meds:.acetaminophen **OR** acetaminophen, bisacodyl, dextromethorphan-guaiFENesin, ondansetron **OR** ondansetron (ZOFRAN) IV, traMADol ? ?Current Labs: reviewed  ? Latest Reference Range & Units 07/12/21 12:50  ?Osmolality 275 - 295 mOsm/kg 239 (LL)  ?(LL): Data is critically low ? ? Latest Reference Range & Units 07/12/21 09:45  ?Uric Acid, Serum 2.5 - 7.1 mg/dL 1.7 (L)  ?(L): Data is abnormally low ? Latest Reference Range & Units 07/12/21 10:05  ?Osmolality, Urine 300 - 900 mOsm/kg 455  ?Sodium, Urine mmol/L ?mmol/L 77 ?78  ? ? ? ?Physical Exam:  Blood pressure (!) 148/65, pulse 69, temperature 98 ?F (36.7 ?C), temperature source Oral, resp. rate 19, height '5\' 1"'$  (1.549 m), weight 60.2 kg, SpO2 95 %. ?GEN: Elderly female, NAD, AAO x3 ?ENT: NCAT, in c-collar ?EYES: EOMI ?CV: Regular, normal S1 and S2 ?PULM: Rhonchorous/crackles in the bases, normal work of breathing ?ABD: Soft, nontender, no masses ?SKIN: No rashes or lesions ?EXT: No  edema ?  ? ?A ?Hypotonic, hypovolemic hyponatremia, most consistent with SIADH (high UOsm and UNa, low uric acid.  Likley had some symptoms of falls and confusion leading up to presentation but now is neurologically intact ?Cont with 1L Fluid restriction ?Req 3% [place] 3/4 into 3/5 ?Stop now given good inc in SNa ?Push high solute foods, esp protein ?Hold SSRI ?Q4h SNa for now ?Add on  nutritional urea ?Cont lasix 20 BID ?SOB / Cough: TTE with nl LVEF, grade 2 DD.  Would expect UNa lower if CHF was primary driver of #1; radiology reports favor ILD ?Chronic Pain ?OA ?Hypothyroidism longstanding ?PMR on chronic corticoseriods ? ?P ?As above ?Stop 3% saline ?Trend SNa ?Fluid restrict ? ? ?[name] ?07/14/2021, 10:22 AM ? ?Recent Labs  ?Lab 07/12/21 ?0945 07/12/21 ?1005 07/12/21 ?1525 07/13/21 ?0201 07/13/21 ?1440 07/14/21 ?0026713/05/23 ?0424583/05/23 ?070998?NA 114* 114*   < > 116*  116*   < > 117* 120* 123*  ?K 3.8 3.7  --  3.2*  --   --  4.1  --   ?CL 82* 80*  --  83*  --   --  89*  --   ?CO2 24  --   --  27  --   --  25  --   ?GLUCOSE 86 88  --  91  --   --  100*  --   ?  BUN 16 19  --  12  --   --  12  --   ?CREATININE 0.46 0.40*  --  0.40*  --   --  0.48  --   ?CALCIUM 7.9*  --   --  7.7*  --   --  7.6*  --   ? < > = values in this interval not displayed.  ? ? ?Recent Labs  ?Lab 07/12/21 ?0945 07/12/21 ?1005 07/13/21 ?0201 07/14/21 ?0420  ?WBC 15.5*  --  14.3* 12.2*  ?NEUTROABS 12.5*  --  11.9* 10.7*  ?HGB 13.5 13.6 12.6 12.0  ?HCT 36.5 40.0 33.9* 32.4*  ?MCV 89.5  --  89.4 89.5  ?PLT 268  --  254 240  ? ? ? ? ? ? ? ? ? ? ?  ?

## 2021-07-14 NOTE — Care Management (Signed)
?  Transition of Care (TOC) Screening Note ? ? ?Patient Details  ?Name: Lorraine Brandt ?Date of Birth: 10/17/1933 ? ? ?Transition of Care (TOC) CM/SW Contact:    ?Carles Collet, RN ?Phone Number: ?07/14/2021, 4:27 PM ? ? ? ?Transition of Care Department [facility]) has reviewed patient and we will continue to monitor patient advancement through interdisciplinary progression rounds.  ? ?Patient had been living in the independent facility section but may need to transition to the nursing facility section of [facility].  ? ? ?

## 2021-07-14 NOTE — Progress Notes (Addendum)
Sanford MD placed order for sodium lab to be drawn now then every 2 hours for 2 occurrences at [place]. First occurrence drawn by lab at 2102. Lab did not come to draw 2nd repeat NA level on time. Stat sodium lab placed at this time. Q4H sodium labs to start at 0145 as ordered by provided. Lab educated on the importance of timely serial testing.  ?

## 2021-07-14 NOTE — Consult Note (Signed)
[place] Nurse Consult Note: ?Reason for Consult:Heel pain. Patient has been to podiatrist for this problem. Last seen on 07/08/21 (Dr. Sanjuan Dame). Healing small fissures at heels. Patient applies moisturizer daily at home. Patient assessed with granddaughter in room.  ?Wound type:Healing fissures ?Pressure Injury POA: N/A ?Measurement:two fissures on left heel, one healed fissure on right heel, each measuring less than 0.4cm x 0.1cm ?Wound bed:N/A, dry scab (dried serum) obscured depth) ?Drainage (amount, consistency, odor) None ?Periwound: intact ?Dressing procedure/placement/frequency:I will add daily moisturizing with Eucerin cream to POC. ? ?Float heels using Prevalon Boots. ?[facility] [facility] nursing team will not follow, but will remain available to this patient, the nursing and medical teams.  Please re-consult if needed. ?Thanks, ?Maudie Flakes, MSN, RN, [place], [place], CWON-AP, [place]  ?Pager# [phone]  ? ? ? ? ? ?  ?

## 2021-07-15 ENCOUNTER — Telehealth (HOSPITAL_COMMUNITY): Payer: Self-pay | Admitting: *Deleted

## 2021-07-15 DIAGNOSIS — E871 Hypo-osmolality and hyponatremia: Secondary | ICD-10-CM | POA: Diagnosis not present

## 2021-07-15 LAB — COMPREHENSIVE METABOLIC PANEL
ALT: 29 U/L (ref 0–44)
AST: 24 U/L (ref 15–41)
Albumin: 2.8 g/dL — ABNORMAL LOW (ref 3.5–5.0)
Alkaline Phosphatase: 92 U/L (ref 38–126)
Anion gap: 8 (ref 5–15)
BUN: 45 mg/dL — ABNORMAL HIGH (ref 8–23)
CO2: 27 mmol/L (ref 22–32)
Calcium: 8.2 mg/dL — ABNORMAL LOW (ref 8.9–10.3)
Chloride: 88 mmol/L — ABNORMAL LOW (ref 98–111)
Creatinine, Ser: 0.59 mg/dL (ref 0.44–1.00)
GFR, Estimated: >60 mL/min (ref >60)
Glucose, Bld: 114 mg/dL — ABNORMAL HIGH (ref 70–99)
Potassium: 3.7 mmol/L (ref 3.5–5.1)
Sodium: 123 mmol/L — ABNORMAL LOW (ref 135–145)
Total Bilirubin: 0.5 mg/dL (ref 0.3–1.2)
Total Protein: 5.1 g/dL — ABNORMAL LOW (ref 6.5–8.1)

## 2021-07-15 LAB — CBC WITH DIFFERENTIAL/PLATELET
Abs Immature Granulocytes: 0.34 10*3/uL — ABNORMAL HIGH (ref 0.00–0.07)
Basophils Absolute: 0 10*3/uL (ref 0.0–0.1)
Basophils Relative: 0 %
Eosinophils Absolute: 0.1 10*3/uL (ref 0.0–0.5)
Eosinophils Relative: 1 %
HCT: 32.6 % — ABNORMAL LOW (ref 36.0–46.0)
Hemoglobin: 11.8 g/dL — ABNORMAL LOW (ref 12.0–15.0)
Immature Granulocytes: 3 %
Lymphocytes Relative: 5 %
Lymphs Abs: 0.5 10*3/uL — ABNORMAL LOW (ref 0.7–4.0)
MCH: 33.2 pg (ref 26.0–34.0)
MCHC: 36.2 g/dL — ABNORMAL HIGH (ref 30.0–36.0)
MCV: 91.8 fL (ref 80.0–100.0)
Monocytes Absolute: 0.5 10*3/uL (ref 0.1–1.0)
Monocytes Relative: 5 %
Neutro Abs: 9.5 10*3/uL — ABNORMAL HIGH (ref 1.7–7.7)
Neutrophils Relative %: 86 %
Platelets: 235 10*3/uL (ref 150–400)
RBC: 3.55 MIL/uL — ABNORMAL LOW (ref 3.87–5.11)
RDW: 15.5 % (ref 11.5–15.5)
WBC: 11 10*3/uL — ABNORMAL HIGH (ref 4.0–10.5)
nRBC: 0 % (ref 0.0–0.2)

## 2021-07-15 LAB — C-REACTIVE PROTEIN: CRP: 5.3 mg/dL — ABNORMAL HIGH (ref <1.0)

## 2021-07-15 LAB — BRAIN NATRIURETIC PEPTIDE: B Natriuretic Peptide: 112 pg/mL — ABNORMAL HIGH (ref 0.0–100.0)

## 2021-07-15 LAB — SODIUM
Sodium: 126 mmol/L — ABNORMAL LOW (ref 135–145)
Sodium: 122 mmol/L — ABNORMAL LOW (ref 135–145)
Sodium: 121 mmol/L — ABNORMAL LOW (ref 135–145)
Sodium: 120 mmol/L — ABNORMAL LOW (ref 135–145)

## 2021-07-15 LAB — MAGNESIUM: Magnesium: 2 mg/dL (ref 1.7–2.4)

## 2021-07-15 MED ORDER — HYDRALAZINE HCL 50 MG PO TABS
50.0000 mg | ORAL_TABLET | Freq: Three times a day (TID) | ORAL | Status: DC
  Administered 2021-07-15 – 2021-07-17 (×7): 50 mg via ORAL
  Filled 2021-07-15 (×7): qty 1

## 2021-07-15 MED ORDER — MELATONIN 5 MG PO TABS
10.0000 mg | ORAL_TABLET | Freq: Every evening | ORAL | Status: DC | PRN
  Administered 2021-07-15 – 2021-07-16 (×2): 10 mg via ORAL
  Filled 2021-07-15 (×2): qty 2

## 2021-07-15 NOTE — Progress Notes (Signed)
PROGRESS NOTE                                                                                                                                                                                                             Patient Demographics:    Lorraine Brandt, is a 88 y.o. female, DOB - [birth year], LXB:[id number]  Outpatient Primary MD for the patient is Reynold Bowen, MD    LOS - 3  Admit date - 07/12/2021    Chief Complaint  Patient presents with   Altered Mental Status       Brief Narrative (HPI from H&P)     Lorraine Brandt  is a 88 y.o. female, with history of osteoarthritis, hypothyroidism, hypertension, polymyalgia rheumatica, dyslipidemia who has been gradually getting weaker over the last few days, also has developed problems laying flat as she gets short of breath, she sustained 2 falls over the last 2 weeks and one of the falls she hurt her neck, she was diagnosed with C6-C7 fracture she saw neurosurgery outpatient and was placed in a c-collar.  She was also placed on a steroid taper.  She continues to get weak and presented to the ER today where she was found to have severe hyponatremia.   Subjective:   Patient in bed in no distress wearing her c-collar denies any headache or chest pain, still has wet cough, no phlegm, no abdominal pain or focal weakness.   Assessment  & Plan :     1.  Generalized weakness, falls at home due to severe hyponatremia.  Patient appears quite frail and deconditioned, due to her falls she  has sustained a C6-C7 fracture as well -she has neck pain was on Lexapro which could cause SIADH, urine sodium is 77 and urine osmolality is much higher than serum.  Neurology on board briefly placed on 3% normal saline, on fluid restriction  and IV Lasix, sodium gradually improving.  Once her sodium is crossed 130 we can try higher dose Lasix as I still think she has a large element of CHF as well.     2.  Acute on chronic diastolic CHF last EF 02%.  Echo noted ++ d CHF, BNP high, has orthopnea, SOB on exertion and crackles on exam, chest x-ray also shows fluid overload, continue diuresis as above.  If blood pressure allows will try to continue low-dose beta-blocker.  CT noted with chronic interstitial changes will require outpatient pulmonary follow-up postdischarge.   3.  Recent fall at home with C7 endplate and C6 vertebral body fracture.  She has seen neurosurgery in the outpatient setting currently had a c-collar which she has to wear at all times till she sees neurosurgery and they take it off.  She is finishing her prednisone taper in the next few days.   4.  Hypertension.  On Coreg will add hydralazine for better control and monitor we will avoid ACE and ARB till fluid status is stabilized.   5.  Osteoarthritis.  Supportive care.   6.  Dyslipidemia.  On statin.   7.  Depression.  Will hold Lexapro until sodium levels stabilize.      Condition - Fair  Family Communication  :  daughter bedside 07/12/21, 07/14/2021  Code Status :  Full  Consults  :  Renal  PUD Prophylaxis : PPI   Procedures  :     TTE - 1. Prominent basal septal hypertrophy and hypertrophied papilary muscles no mid cavitary gradient recoreded No SAM . Left ventricular ejection fraction, by estimation, is 60 to 65%. The left ventricle has normal function. The left ventricle has no regional wall motion abnormalities. There is severe asymmetric left ventricular hypertrophy of the basal and septal segments. Left ventricular diastolic parameters are consistent with Grade II diastolic dysfunction (pseudonormalization). Elevated left ventricular end-diastolic pressure.  2. Right ventricular systolic function is normal. The right ventricular size is normal.  3. The  pericardial effusion is posterior to the left ventricle.  4. The mitral valve is abnormal. Trivial mitral valve regurgitation. No evidence of mitral stenosis.  5. Small gradient across AV but morphologically opens well . The aortic valve is tricuspid. There is mild calcification of the aortic valve. There is mild thickening of the aortic valve. Aortic valve regurgitation is mild. Aortic valve sclerosis is present, with no evidence of aortic valve stenosis.  6. The inferior vena cava is normal in size with greater than 50% respiratory variability, suggesting right atrial pressure of 3 mmHg.      Disposition Plan  :    Status is: Inpatient  DVT Prophylaxis  :    Place TED hose Start: 07/13/21 1028 heparin injection 5,000 Units Start: 07/12/21 1400  Lab Results  Component Value Date   PLT 235 07/14/2021    Diet :  Diet Order             Diet regular Room service appropriate? Yes with Assist; Fluid consistency: Thin; Fluid restriction: Other (see comments)  Diet effective now                    Inpatient Medications  Scheduled Meds:  carvedilol  3.125 mg Oral BID WC   diltiazem  180 mg Oral q morning   furosemide  20 mg Intravenous BID   heparin  5,000 Units Subcutaneous Q8H   hydrocerin   Topical Daily   levothyroxine  125 mcg Oral Once per day on Mon Tue Wed Thu Fri   pantoprazole  40 mg Oral Daily   predniSONE  5 mg Oral Q breakfast   rosuvastatin  20 mg Oral Daily   urea  15 g Oral BID   Continuous Infusions:   PRN Meds:.acetaminophen **OR** acetaminophen, bisacodyl, dextromethorphan-guaiFENesin, ondansetron **OR** ondansetron (ZOFRAN) IV, traMADol  Antibiotics  :    Anti-infectives (From admission, onward)    None        Time Spent in minutes  30   Lala Lund M.D on 07/15/2021 at 10:24 AM  To page go to [email or web address]   [facility] -  Office  [phone]  See all Orders from today for further details    Objective:   Vitals:    07/14/21 2017 07/15/21 0000 07/15/21 0406 07/15/21 0807  BP: 132/79 (!) 165/65 (!) 161/69 (!) 165/84  Pulse: 73 79 67 71  Resp: '20 14 10 15  '$ Temp: 97.7 F (36.5 C) 98 F (36.7 C) 97.9 F (36.6 C) 98.3 F (36.8 C)  TempSrc: Oral Oral Oral Oral  SpO2: 95% 94% 93% 95%  Weight:      Height:        Wt Readings from Last 3 Encounters:  07/13/21 60.2 kg  07/03/21 56.7 kg  07/28/19 58.5 kg     Intake/Output Summary (Last 24 hours) at 07/15/2021 1024 Last data filed at 07/15/2021 1740 Gross per 24 hour  Intake 240 ml  Output 2600 ml  Net -2360 ml     Physical Exam  Awake Alert, No new F.N deficits, Normal affect [place].AT,PERRAL Wearing a c-collar Symmetrical Chest wall movement, Good air movement bilaterally, coarse bibasilar crackles RRR,No Gallops, Rubs or new Murmurs,  +ve B.Sounds, Abd Soft, No tenderness,   No Cyanosis, Clubbing or edema      Data Review:    CBC Recent Labs  Lab 07/12/21 0945 07/12/21 1005 07/13/21 0201 07/14/21 0420 07/14/21 2359  WBC 15.5*  --  14.3* 12.2* 11.0*  HGB 13.5 13.6 12.6 12.0 11.8*  HCT 36.5 40.0 33.9* 32.4* 32.6*  PLT 268  --  254 240 235  MCV 89.5  --  89.4 89.5 91.8  MCH 33.1  --  33.2 33.1 33.2  MCHC 37.0*  --  37.2* 37.0* 36.2*  RDW 14.9  --  15.0 15.2 15.5  LYMPHSABS 1.4  --  1.2 0.6* 0.5*  MONOABS 1.0  --  0.6 0.5 0.5  EOSABS 0.1  --  0.1 0.0 0.1  BASOSABS 0.0  --  0.0 0.0 0.0    Recent Labs  Lab 07/12/21 0945 07/12/21 1005 07/12/21 1250 07/12/21 1525 07/13/21 0201 07/13/21 1440 07/14/21 0042 07/14/21 0420 07/14/21 0735 07/14/21 1143 07/14/21 1554 07/14/21 1931 07/14/21 2359 07/15/21 0751  NA 114* 114*  --    < > 116*   116*   < >  --  120*   < > 123* 122* 123* 123* 126*  K 3.8 3.7  --   --  3.2*  --   --  4.1  --   --   --   --  3.7  --   CL 82* 80*  --   --  83*  --   --  89*  --   --   --   --  88*  --   CO2 24  --   --   --  27  --   --  25  --   --   --   --  27  --   GLUCOSE 86 88  --   --  91  --    --  100*  --   --   --   --  114*  --   BUN 16 19  --   --  12  --   --  12  --   --   --   --  45*  --   CREATININE 0.46 0.40*  --   --  0.40*  --   --  0.48  --   --   --   --  0.59  --   CALCIUM 7.9*  --   --   --  7.7*  --   --  7.6*  --   --   --   --  8.2*  --   AST 27  --   --   --  19  --   --  17  --   --   --   --  24  --   ALT 25  --   --   --  25  --   --  25  --   --   --   --  29  --   ALKPHOS 73  --   --   --  79  --   --  86  --   --   --   --  92  --   BILITOT 1.3*  --   --   --  1.0  --   --  0.5  --   --   --   --  0.5  --   ALBUMIN 3.3*  --   --   --  2.9*  --   --  2.8*  --   --   --   --  2.8*  --   MG 1.8  --   --   --  1.7  --   --  2.2  --   --   --   --  2.0  --   CRP  --   --  <0.5  --  0.5  --   --  1.8*  --   --   --   --  5.3*  --   PROCALCITON  --   --   --   --  <0.10   <0.10  --   --  <0.10  --   --   --   --   --   --   TSH  --   --   --   --   --   --  0.329*  --   --   --   --   --   --   --   BNP 204.3*  --   --   --  221.1*  --   --  136.0*  --   --   --   --  112.0*  --    < > = values in this interval not displayed.    ------------------------------------------------------------------------------------------------------------------ No results for input(s): CHOL, HDL, LDLCALC, TRIG, CHOLHDL, LDLDIRECT in the last 72 hours.  No results found for:  HGBA1C ------------------------------------------------------------------------------------------------------------------ Recent Labs    07/14/21 0042  TSH 0.329*    Cardiac Enzymes No results for input(s): CKMB, TROPONINI, MYOGLOBIN in the last 168 hours.  Invalid input(s): CK ------------------------------------------------------------------------------------------------------------------    Component Value Date/Time   BNP 112.0 (H) 07/14/2021 2359   Radiology Reports DG Chest 2 View  Result Date: 07/12/2021 CLINICAL DATA:  Pt brought from home for c/o intermittent AMS, increased general  weakness and decreased mobility. Pt has non productive cough. Pt denies any CP but states she has been feeling SOB x a few days Nonsmoker Hx of HTN, cancer, GERD, EXAM: CHEST - 2 VIEW COMPARISON:  04/19/2014 FINDINGS: Progressive development of coarse peripheral interstitial opacities, worse left than right, involving bases more than apices. Heart size upper limits normal.  Tortuous ectatic thoracic aorta. No effusion. Levoscoliosis in the upper thoracic spine with suspected vertebral segmentation anomaly, incompletely characterized. IMPRESSION: 1. Coarse asymmetric interstitial opacities left greater than right, suggesting progressive chronic interstitial lung disease. Cannot exclude superimposed interstitial infiltrate or asymmetric edema. Electronically Signed   By: Lucrezia Europe M.D.   On: 07/12/2021 10:10   DG Lumbar Spine Complete  Result Date: 07/03/2021 CLINICAL DATA:  Status post fall with lower back pain. EXAM: LUMBAR SPINE - COMPLETE 4+ VIEW COMPARISON:  February 25, 2021 FINDINGS: There is no evidence of acute lumbar spine fracture. Mild dextroscoliosis is seen with stable grade 1 anterolisthesis of the L4 vertebral body on L5. Mild to moderate severity multilevel endplate sclerosis is seen. Stable marked severity intervertebral disc space narrowing is noted at the level of L4-L5. IMPRESSION: 1. No evidence of acute lumbar spine fracture. 2. Stable marked severity degenerative disc disease at L4-L5 with stable grade 1 anterolisthesis of the L4 vertebral body. Electronically Signed   By: Virgina Norfolk M.D.   On: 07/03/2021 02:47   CT Head Wo Contrast  Result Date: 07/04/2021 CLINICAL DATA:  Trauma EXAM: CT HEAD WITHOUT CONTRAST TECHNIQUE: Contiguous axial images were obtained from the base of the skull through the vertex without intravenous contrast. RADIATION DOSE REDUCTION: This exam was performed according to the departmental dose-optimization program which includes automated exposure control,  adjustment of the mA and/or kV according to patient size and/or use of iterative reconstruction technique. COMPARISON:  07/03/2021 FINDINGS: Brain: No acute intracranial findings are seen. There are no signs of intracranial bleeding. There are no epidural or subdural fluid collections. Cortical sulci are prominent. There is decreased density in the periventricular white matter. Vascular: Unremarkable. Skull: No fracture is seen in the calvarium. There is subcutaneous contusion/hematoma in the posterior parietal scalp. Sinuses/Orbits: There are no air-fluid levels. Other: None IMPRESSION: No acute intracranial findings are seen in noncontrast CT brain. There is subcutaneous contusion/hematoma in the parietal scalp. No fracture is seen in the calvarium. Electronically Signed   By: Elmer Picker M.D.   On: 07/04/2021 17:47   CT Head Wo Contrast  Result Date: 07/03/2021 CLINICAL DATA:  Recent slip and fall with headaches and neck pain, initial encounter EXAM: CT HEAD WITHOUT CONTRAST CT CERVICAL SPINE WITHOUT CONTRAST TECHNIQUE: Multidetector CT imaging of the head and cervical spine was performed following the standard protocol without intravenous contrast. Multiplanar CT image reconstructions of the cervical spine were also generated. RADIATION DOSE REDUCTION: This exam was performed according to the departmental dose-optimization program which includes automated exposure control, adjustment of the mA and/or kV according to patient size and/or use of iterative reconstruction technique. COMPARISON:  04/18/2019 FINDINGS: CT HEAD FINDINGS Brain: No evidence  of acute infarction, hemorrhage, hydrocephalus, extra-axial collection or mass lesion/mass effect. Chronic atrophic and ischemic changes are noted. Vascular: No hyperdense vessel or unexpected calcification. Skull: Normal. Negative for fracture or focal lesion. Sinuses/Orbits: No acute finding. Other: None. CT CERVICAL SPINE FINDINGS Alignment: Loss of the  normal cervical lordosis is noted. Anterolisthesis of C4 on C5 is seen felt to be degenerative in nature. Skull base and vertebrae: 7 cervical segments are well visualized. Vertebral body height is well maintained. At least partial fusion at C5-6 is noted. Anterolisthesis of C4 on C5 is noted of a degenerative nature. No acute fracture or acute facet abnormality is noted. Multilevel facet hypertrophic changes are seen. Significant scoliosis in the upper thoracic spine is noted concave to the left. There are findings suspicious for hemi vertebra between C6 and T1 vertebra on the left. Soft tissues and spinal canal: Surrounding soft tissue structures show vascular calcifications. Upper chest: Visualized lung apices are within normal limits. Other: None IMPRESSION: CT of the head: Chronic atrophic and ischemic changes. CT of the cervical spine: Multilevel degenerative change. No acute abnormality noted. 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 Electronically Signed   By: Inez Catalina M.D.   On: 07/03/2021 04:02   CT CHEST WO CONTRAST  Result Date: 07/14/2021 CLINICAL DATA:  Cough for greater than 8 weeks. Progressive weakness. EXAM: CT CHEST WITHOUT CONTRAST TECHNIQUE: Multidetector CT imaging of the chest was performed following the standard protocol without IV contrast. RADIATION DOSE REDUCTION: This exam was performed according to the departmental dose-optimization program which includes automated exposure control, adjustment of the mA and/or kV according to patient size and/or use of iterative reconstruction technique. COMPARISON:  None. FINDINGS: Cardiovascular: Normal heart size. No pericardial effusion. Aortic atherosclerosis and coronary artery calcifications. Mediastinum/Nodes: No enlarged mediastinal or axillary lymph nodes. Thyroid gland, trachea, and esophagus demonstrate no significant findings. Lungs/Pleura: No pleural effusion, airspace consolidation, atelectasis, or pneumothorax. No interstitial  edema. Signs of chronic interstitial lung disease identified with peripheral predominant subpleural reticulation, traction bronchiectasis and multifocal patchy areas of ground-glass attenuation. Thickening of the peribronchovascular interstitium is also identified. No frank honeycombing noted. Calcified granuloma is noted within the left upper lobe. Upper Abdomen: No acute findings within the imaged portions of the upper abdomen. Subcapsular cyst within medial segment of left hepatic lobe measures 9 mm. Aortic atherosclerotic calcifications. Musculoskeletal: Thoracolumbar scoliosis deformity with mild degenerative disc disease. No acute or suspicious osseous findings. IMPRESSION: 1. No acute cardiopulmonary abnormality. 2. Signs of chronic interstitial lung disease including peripheral subpleural reticulation, traction bronchiectasis and multifocal areas of ground-glass attenuation. Recommend follow-up imaging with high-resolution CT of the chest in 6 months to assess for temporal change in the appearance of the lungs. 3. Thoracolumbar scoliosis and degenerative disc disease. 4. Aortic Atherosclerosis (ICD10-I70.0). Electronically Signed   By: Kerby Moors M.D.   On: 07/14/2021 14:38   CT Cervical Spine Wo Contrast  Addendum Date: 07/04/2021   ADDENDUM REPORT: 07/04/2021 17:44 ADDENDUM: Imaging findings were relayed to patient's provider Theodis Blaze by telephone call. Electronically Signed   By: Elmer Picker M.D.   On: 07/04/2021 17:44   Result Date: 07/04/2021 CLINICAL DATA:  Trauma EXAM: CT CERVICAL SPINE WITHOUT CONTRAST TECHNIQUE: Multidetector CT imaging of the cervical spine was performed without intravenous contrast. Multiplanar CT image reconstructions were also generated. RADIATION DOSE REDUCTION: This exam was performed according to the departmental dose-optimization program which includes automated exposure control, adjustment of the mA and/or kV according to patient size and/or  use of  iterative reconstruction technique. COMPARISON:  07/03/2021 FINDINGS: Alignment: There is minimal anterolisthesis at C4-C5 level suggesting previous ligament injury and facet degeneration. There is congenital partial fusion of bodies of C5 and C6 vertebrae. There is a radiolucent line in the right lamina of C6 vertebra. There is radiolucent line in the left anterior superior C7 vertebra. There is congenital partial fusion of bodies of T1 and T2 vertebrae. 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 Skull base and vertebrae: Degenerative changes are noted with bony spurs and facet hypertrophy at multiple levels in the cervical spine. Soft tissues and spinal canal: There is no significant central spinal stenosis. Disc levels: There is encroachment of neural foramina from C2-C7 levels. Upper chest: Unremarkable. Other: Thyroid is not seen suggesting atrophy or previous removal. IMPRESSION: There is radiolucent line in the right lamina of C6 vertebra. There is radiolucent line in the anterior superior left side body C7 vertebra. Possibility of recent fractures is not excluded. Follow-up MRI as clinically warranted should be considered. Cervical spondylosis with encroachment of neural foramina at multiple levels. Congenital deformity is seen in the lower cervical and upper thoracic spine. Other findings as described in the body of the report. Electronically Signed: By: Elmer Picker M.D. On: 07/04/2021 17:40   CT Cervical Spine Wo Contrast  Result Date: 07/03/2021 CLINICAL DATA:  Recent slip and fall with headaches and neck pain, initial encounter EXAM: CT HEAD WITHOUT CONTRAST CT CERVICAL SPINE WITHOUT CONTRAST TECHNIQUE: Multidetector CT imaging of the head and cervical spine was performed following the standard protocol without intravenous contrast. Multiplanar CT image reconstructions of the cervical spine were also generated. RADIATION DOSE REDUCTION: This exam was  performed according to the departmental dose-optimization program which includes automated exposure control, adjustment of the mA and/or kV according to patient size and/or use of iterative reconstruction technique. COMPARISON:  04/18/2019 FINDINGS: CT HEAD FINDINGS Brain: No evidence of acute infarction, hemorrhage, hydrocephalus, extra-axial collection or mass lesion/mass effect. Chronic atrophic and ischemic changes are noted. Vascular: No hyperdense vessel or unexpected calcification. Skull: Normal. Negative for fracture or focal lesion. Sinuses/Orbits: No acute finding. Other: None. CT CERVICAL SPINE FINDINGS Alignment: Loss of the normal cervical lordosis is noted. Anterolisthesis of C4 on C5 is seen felt to be degenerative in nature. Skull base and vertebrae: 7 cervical segments are well visualized. Vertebral body height is well maintained. At least partial fusion at C5-6 is noted. Anterolisthesis of C4 on C5 is noted of a degenerative nature. No acute fracture or acute facet abnormality is noted. Multilevel facet hypertrophic changes are seen. Significant scoliosis in the upper thoracic spine is noted concave to the left. There are findings suspicious for hemi vertebra between C6 and T1 vertebra on the left. Soft tissues and spinal canal: Surrounding soft tissue structures show vascular calcifications. Upper chest: Visualized lung apices are within normal limits. Other: None IMPRESSION: CT of the head: Chronic atrophic and ischemic changes. CT of the cervical spine: Multilevel degenerative change. No acute abnormality noted. 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 Electronically Signed   By: Inez Catalina M.D.   On: 07/03/2021 04:02   MR CERVICAL SPINE WO CONTRAST  Result Date: 07/10/2021 CLINICAL DATA:  Cervicalgia EXAM: MRI CERVICAL SPINE WITHOUT CONTRAST TECHNIQUE: Multiplanar, multisequence MR imaging of the cervical spine was performed. No intravenous contrast was administered. COMPARISON:   Cervical spine CT 07/04/2021 FINDINGS: Alignment: Segmentation anomalies at the cervicothoracic junction contribute to severe levoscoliosis  of the upper thoracic spine. Grade 1 anterolisthesis at C3-4 and C4-5. Vertebrae: There is edema at the superior C7 endplate in the region of the fracture identified on the earlier CT. There is also edema at the site of the C6 spinous process fracture. Assessment of the bones is otherwise limited by the degree of motion. Cord: Assessment severely limited by motion. Posterior Fossa, vertebral arteries, paraspinal tissues: Negative. Disc levels: C1-2: Unremarkable. C2-3: No disc herniation. There is no spinal canal stenosis. No neural foraminal stenosis. C3-4: Facet arthrosis, left-greater-than-right, and left foraminal disc protrusion. There is no spinal canal stenosis. Severe left neural foraminal stenosis. C4-5: Severe left facet arthrosis. There is no spinal canal stenosis. Severe left neural foraminal stenosis. C5-6: No disc herniation, spinal canal stenosis or neural impingement C6-7: No disc herniation, spinal canal stenosis or neural impingement . C7-T1: No disc herniation, spinal canal stenosis or neural impingement IMPRESSION: 1. Severely motion degraded study. 2. Edema at the site of the superior C7 endplate fracture and C6 spinous process fracture. 3. Severe left C3-4 and C4-5 neural foraminal stenosis secondary to combination of facet arthrosis and left foraminal disc protrusion. 4. Cervicothoracic segmentation anomalies with marked scoliosis. Electronically Signed   By: Ulyses Jarred M.D.   On: 07/10/2021 21:34   DG Chest Port 1 View  Result Date: 07/13/2021 CLINICAL DATA:  88 year old female with history of shortness of breath. EXAM: PORTABLE CHEST 1 VIEW COMPARISON:  Chest x-ray 07/12/2021. FINDINGS: Lung volumes are low. No consolidative airspace disease. No pleural effusions. Widespread interstitial prominence, most evident throughout the mid to lower lungs  bilaterally, similar to the prior examination no pneumothorax. No evidence of pulmonary edema. Heart size is normal. Numerous calcified mediastinal and bilateral hilar lymph nodes are incidentally noted. Upper mediastinal contours are distorted by patient positioning. Atherosclerosis in the thoracic aorta. IMPRESSION: 1. Low lung volumes with diffuse interstitial prominence, most evident throughout the mid to lower lungs bilaterally, concerning for interstitial lung disease. Further evaluation with nonemergent high-resolution chest CT is recommended in the near future to better evaluate these findings. 2. Aortic atherosclerosis. Electronically Signed   By: Vinnie Langton M.D.   On: 07/13/2021 06:36   ECHOCARDIOGRAM COMPLETE  Result Date: 07/12/2021    ECHOCARDIOGRAM REPORT   Patient Name:   ABBIGALE MCELHANEY Date of Exam: 07/12/2021 Medical Rec #:  [id number]           Height:       61.0 in Accession #:    [id number]          Weight:       125.0 lb Date of Birth:  [birth year]          BSA:          1.547 m Patient Age:    11 years            BP:           136/84 mmHg Patient Gender: F                   HR:           71 bpm. Exam Location:  Inpatient Procedure: 2D Echo, Cardiac Doppler and Color Doppler Indications:    CHF  History:        Patient has prior history of Echocardiogram examinations, most                 recent 07/28/2019. CHF; Risk Factors:Hypertension and  Dyslipidemia.  Sonographer:    Luisa Hart RDCS Referring Phys: [id number]  1. Prominent basal septal hypertrophy and hypertrophied papilary muscles no mid cavitary gradient recoreded No SAM . Left ventricular ejection fraction, by estimation, is 60 to 65%. The left ventricle has normal function. The left ventricle has no regional wall motion abnormalities. There is severe asymmetric left ventricular hypertrophy of the basal and septal segments. Left ventricular diastolic parameters are consistent with Grade  II diastolic dysfunction (pseudonormalization). Elevated left ventricular end-diastolic pressure.  2. Right ventricular systolic function is normal. The right ventricular size is normal.  3. The pericardial effusion is posterior to the left ventricle.  4. The mitral valve is abnormal. Trivial mitral valve regurgitation. No evidence of mitral stenosis.  5. Small gradient across AV but morphologically opens well . The aortic valve is tricuspid. There is mild calcification of the aortic valve. There is mild thickening of the aortic valve. Aortic valve regurgitation is mild. Aortic valve sclerosis is present, with no evidence of aortic valve stenosis.  6. The inferior vena cava is normal in size with greater than 50% respiratory variability, suggesting right atrial pressure of 3 mmHg. FINDINGS  Left Ventricle: Prominent basal septal hypertrophy and hypertrophied papilary muscles no mid cavitary gradient recoreded No SAM. Left ventricular ejection fraction, by estimation, is 60 to 65%. The left ventricle has normal function. The left ventricle has no regional wall motion abnormalities. The left ventricular internal cavity size was normal in size. There is severe asymmetric left ventricular hypertrophy of the basal and septal segments. Left ventricular diastolic parameters are consistent with Grade II diastolic dysfunction (pseudonormalization). Elevated left ventricular end-diastolic pressure. Right Ventricle: The right ventricular size is normal. No increase in right ventricular wall thickness. Right ventricular systolic function is normal. Left Atrium: Left atrial size was normal in size. Right Atrium: Right atrial size was normal in size. Pericardium: Trivial pericardial effusion is present. The pericardial effusion is posterior to the left ventricle. Mitral Valve: The mitral valve is abnormal. There is mild thickening of the mitral valve leaflet(s). There is moderate calcification of the mitral valve leaflet(s). Mild  mitral annular calcification. Trivial mitral valve regurgitation. No evidence of mitral valve stenosis. MV peak gradient, 4.4 mmHg. The mean mitral valve gradient is 2.0 mmHg. Tricuspid Valve: The tricuspid valve is normal in structure. Tricuspid valve regurgitation is trivial. No evidence of tricuspid stenosis. Aortic Valve: Small gradient across AV but morphologically opens well. The aortic valve is tricuspid. There is mild calcification of the aortic valve. There is mild thickening of the aortic valve. Aortic valve regurgitation is mild. Aortic regurgitation PHT measures 565 msec. Aortic valve sclerosis is present, with no evidence of aortic valve stenosis. Aortic valve mean gradient measures 7.0 mmHg. Aortic valve peak gradient measures 13.3 mmHg. Aortic valve area, by VTI measures 1.78 cm. Pulmonic Valve: The pulmonic valve was normal in structure. Pulmonic valve regurgitation is not visualized. No evidence of pulmonic stenosis. Aorta: The aortic root is normal in size and structure. Venous: The inferior vena cava is normal in size with greater than 50% respiratory variability, suggesting right atrial pressure of 3 mmHg. IAS/Shunts: No atrial level shunt detected by color flow Doppler.  LEFT VENTRICLE PLAX 2D LVIDd:         3.00 cm     Diastology LVIDs:         1.40 cm     LV e' medial:    4.43 cm/s LV PW:  1.00 cm     LV E/e' medial:  17.2 LV IVS:        1.80 cm     LV e' lateral:   6.30 cm/s LVOT diam:     1.80 cm     LV E/e' lateral: 12.1 LV SV:         57 LV SV Index:   37 LVOT Area:     2.54 cm  LV Volumes (MOD) LV vol d, MOD A2C: 29.0 ml LV vol d, MOD A4C: 22.0 ml LV vol s, MOD A2C: 7.9 ml LV vol s, MOD A4C: 4.8 ml LV SV MOD A2C:     21.1 ml LV SV MOD A4C:     22.0 ml LV SV MOD BP:      20.8 ml RIGHT VENTRICLE RV Basal diam:  2.60 cm RV Mid diam:    2.60 cm RV S prime:     13.40 cm/s TAPSE (M-mode): 1.6 cm LEFT ATRIUM             Index        RIGHT ATRIUM          Index LA diam:        3.20 cm  2.07 cm/m   RA Area:     8.13 cm LA Vol (A2C):   40.2 ml 25.99 ml/m  RA Volume:   12.60 ml 8.15 ml/m LA Vol (A4C):   39.1 ml 25.28 ml/m LA Biplane Vol: 41.9 ml 27.09 ml/m  AORTIC VALVE                     PULMONIC VALVE AV Area (Vmax):    1.76 cm      PV Vmax:       1.00 m/s AV Area (Vmean):   1.76 cm      PV Vmean:      65.550 cm/s AV Area (VTI):     1.78 cm      PV VTI:        0.196 m AV Vmax:           182.50 cm/s   PV Peak grad:  4.0 mmHg AV Vmean:          120.000 cm/s  PV Mean grad:  2.5 mmHg AV VTI:            0.322 m AV Peak Grad:      13.3 mmHg AV Mean Grad:      7.0 mmHg LVOT Vmax:         126.00 cm/s LVOT Vmean:        83.200 cm/s LVOT VTI:          0.225 m LVOT/AV VTI ratio: 0.70 AI PHT:            565 msec  AORTA Ao Root diam: 2.60 cm Ao Asc diam:  3.20 cm MITRAL VALVE               TRICUSPID VALVE MV Area (PHT): 3.42 cm    TR Peak grad:   33.9 mmHg MV Area VTI:   1.90 cm    TR Vmax:        291.00 cm/s MV Peak grad:  4.4 mmHg MV Mean grad:  2.0 mmHg    SHUNTS MV Vmax:       1.05 m/s    Systemic VTI:  0.22 m MV Vmean:      60.5 cm/s   Systemic Diam: 1.80 cm  MV Decel Time: 222 msec MV E velocity: 76.20 cm/s MV A velocity: 82.50 cm/s MV E/A ratio:  0.92 Jenkins Rouge MD Electronically signed by Jenkins Rouge MD Signature Date/Time: 07/12/2021/2:40:27 PM    Final

## 2021-07-15 NOTE — Progress Notes (Signed)
[facility] ROUNDING NOTE   Subjective:   Interval History: 88 year old lady history of falls with new euvolemic hypotonic hyponatremia and a diagnosis of likely SIADH.  She has been treated with saline 3% 07/13/2021-07/14/2021.  She was placed on fluid restriction.  She initially presented with some confusion but is now neurologically intact.  She does have a history of PMR and has been on chronic corticosteroids as well as a history of hypothyroidism on thyroid replacement.  Blood pressure 161/69 pulse 79 temperature 97.9 O2 sats 91%  Sodium 123 potassium 3.7 chloride 88 CO2 27 BUN 45 creatinine 0.59 glucose 114 calcium 8.2 magnesium 2 albumin 2.8.  Urine output 3 L 07/14/2020  Objective:  Vital signs in last 24 hours:  Temp:  [97.7 F (36.5 C)-98.3 F (36.8 C)] 97.9 F (36.6 C) (03/06 0406) Pulse Rate:  [67-79] 67 (03/06 0406) Resp:  [10-20] 10 (03/06 0406) BP: (113-165)/(59-95) 161/69 (03/06 0406) SpO2:  [93 %-96 %] 93 % (03/06 0406)  Weight change:  Filed Weights   07/12/21 0922 07/13/21 0120  Weight: 56.7 kg 60.2 kg    Intake/Output: I/O last 3 completed shifts: In: 598.8 [P.O.:480; I.V.:118.8] Out: 3225 [Urine:3225]   Intake/Output this shift:  No intake/output data recorded.  CVS- RRR RS- CTA ABD- BS present soft non-distended EXT- no edema   Basic Metabolic Panel: Recent Labs  Lab 07/12/21 0945 07/12/21 1005 07/12/21 1525 07/13/21 0201 07/13/21 1440 07/14/21 0420 07/14/21 0735 07/14/21 1143 07/14/21 1554 07/14/21 1931 07/14/21 2359  NA 114* 114*   < > 116*   116*   < > 120* 123* 123* 122* 123* 123*  K 3.8 3.7  --  3.2*  --  4.1  --   --   --   --  3.7  CL 82* 80*  --  83*  --  89*  --   --   --   --  88*  CO2 24  --   --  27  --  25  --   --   --   --  27  GLUCOSE 86 88  --  91  --  100*  --   --   --   --  114*  BUN 16 19  --  12  --  12  --   --   --   --  45*  CREATININE 0.46 0.40*  --  0.40*  --  0.48  --   --   --   --  0.59   CALCIUM 7.9*  --   --  7.7*  --  7.6*  --   --   --   --  8.2*  MG 1.8  --   --  1.7  --  2.2  --   --   --   --  2.0   < > = values in this interval not displayed.    Liver Function Tests: Recent Labs  Lab 07/12/21 0945 07/13/21 0201 07/14/21 0420 07/14/21 2359  AST '27 19 17 24  '$ ALT '25 25 25 29  '$ ALKPHOS 73 79 86 92  BILITOT 1.3* 1.0 0.5 0.5  PROT 5.3* 4.9* 5.0* 5.1*  ALBUMIN 3.3* 2.9* 2.8* 2.8*   No results for input(s): LIPASE, AMYLASE in the last 168 hours. No results for input(s): AMMONIA in the last 168 hours.  CBC: Recent Labs  Lab 07/12/21 0945 07/12/21 1005 07/13/21 0201 07/14/21 0420 07/14/21 2359  WBC 15.5*  --  14.3* 12.2* 11.0*  NEUTROABS 12.5*  --  11.9* 10.7* 9.5*  HGB 13.5 13.6 12.6 12.0 11.8*  HCT 36.5 40.0 33.9* 32.4* 32.6*  MCV 89.5  --  89.4 89.5 91.8  PLT 268  --  254 240 235    Cardiac Enzymes: No results for input(s): CKTOTAL, CKMB, CKMBINDEX, TROPONINI in the last 168 hours.  BNP: Invalid input(s): POCBNP  CBG: No results for input(s): GLUCAP in the last 168 hours.  Microbiology: Results for orders placed or performed during the hospital encounter of 07/12/21  Resp Panel by RT-PCR (Flu A&B, Covid) Nasopharyngeal Swab     Status: None   Collection Time: 07/12/21  9:42 AM   Specimen: Nasopharyngeal Swab; Nasopharyngeal(NP) swabs in vial transport medium  Result Value Ref Range Status   SARS Coronavirus 2 by RT PCR NEGATIVE NEGATIVE Final    Comment: (NOTE) SARS-CoV-2 target nucleic acids are NOT DETECTED.  The SARS-CoV-2 RNA is generally detectable in upper respiratory specimens during the acute phase of infection. The lowest concentration of SARS-CoV-2 viral copies this assay can detect is 138 copies/mL. A negative result does not preclude SARS-Cov-2 infection and should not be used as the sole basis for treatment or other patient management decisions. A negative result may occur with  improper specimen collection/handling,  submission of specimen other than nasopharyngeal swab, presence of viral mutation(s) within the areas targeted by this assay, and inadequate number of viral copies(<138 copies/mL). A negative result must be combined with clinical observations, patient history, and epidemiological information. The expected result is Negative.  Fact Sheet for Patients:  EntrepreneurPulse.com.au  Fact Sheet for Healthcare Providers:  IncredibleEmployment.be  This test is no t yet approved or cleared by the [place] FDA and  has been authorized for detection and/or diagnosis of SARS-CoV-2 by FDA under an Emergency Use Authorization (EUA). This EUA will remain  in effect (meaning this test can be used) for the duration of the COVID-19 declaration under Section 564(b)(1) of the Act, 21 U.S.C.section 360bbb-3(b)(1), unless the authorization is terminated  or revoked sooner.       Influenza A by PCR NEGATIVE NEGATIVE Final   Influenza B by PCR NEGATIVE NEGATIVE Final    Comment: (NOTE) The Xpert Xpress SARS-CoV-2/FLU/RSV plus assay is intended as an aid in the diagnosis of influenza from Nasopharyngeal swab specimens and should not be used as a sole basis for treatment. Nasal washings and aspirates are unacceptable for Xpert Xpress SARS-CoV-2/FLU/RSV testing.  Fact Sheet for Patients: EntrepreneurPulse.com.au  Fact Sheet for Healthcare Providers: IncredibleEmployment.be  This test is not yet approved or cleared by the [place] FDA and has been authorized for detection and/or diagnosis of SARS-CoV-2 by FDA under an Emergency Use Authorization (EUA). This EUA will remain in effect (meaning this test can be used) for the duration of the COVID-19 declaration under Section 564(b)(1) of the Act, 21 U.S.C. section 360bbb-3(b)(1), unless the authorization is terminated or revoked.  Performed at [facility], [place] [address], [place], [place] [address]     Coagulation Studies: No results for input(s): LABPROT, INR in the last 72 hours.  Urinalysis: Recent Labs    07/12/21 1005  COLORURINE YELLOW  LABSPEC 1.015  PHURINE 7.0  GLUCOSEU NEGATIVE  HGBUR NEGATIVE  BILIRUBINUR NEGATIVE  KETONESUR NEGATIVE  PROTEINUR NEGATIVE  NITRITE NEGATIVE  LEUKOCYTESUR NEGATIVE      Imaging: CT CHEST WO CONTRAST  Result Date: 07/14/2021 CLINICAL DATA:  Cough for greater than 8 weeks. Progressive weakness. EXAM: CT CHEST WITHOUT  CONTRAST TECHNIQUE: Multidetector CT imaging of the chest was performed following the standard protocol without IV contrast. RADIATION DOSE REDUCTION: This exam was performed according to the departmental dose-optimization program which includes automated exposure control, adjustment of the mA and/or kV according to patient size and/or use of iterative reconstruction technique. COMPARISON:  None. FINDINGS: Cardiovascular: Normal heart size. No pericardial effusion. Aortic atherosclerosis and coronary artery calcifications. Mediastinum/Nodes: No enlarged mediastinal or axillary lymph nodes. Thyroid gland, trachea, and esophagus demonstrate no significant findings. Lungs/Pleura: No pleural effusion, airspace consolidation, atelectasis, or pneumothorax. No interstitial edema. Signs of chronic interstitial lung disease identified with peripheral predominant subpleural reticulation, traction bronchiectasis and multifocal patchy areas of ground-glass attenuation. Thickening of the peribronchovascular interstitium is also identified. No frank honeycombing noted. Calcified granuloma is noted within the left upper lobe. Upper Abdomen: No acute findings within the imaged portions of the upper abdomen. Subcapsular cyst within medial segment of left hepatic lobe measures 9 mm. Aortic atherosclerotic calcifications. Musculoskeletal: Thoracolumbar scoliosis deformity with mild degenerative disc disease. No acute or  suspicious osseous findings. IMPRESSION: 1. No acute cardiopulmonary abnormality. 2. Signs of chronic interstitial lung disease including peripheral subpleural reticulation, traction bronchiectasis and multifocal areas of ground-glass attenuation. Recommend follow-up imaging with high-resolution CT of the chest in 6 months to assess for temporal change in the appearance of the lungs. 3. Thoracolumbar scoliosis and degenerative disc disease. 4. Aortic Atherosclerosis (ICD10-I70.0). Electronically Signed   By: Kerby Moors M.D.   On: 07/14/2021 14:38     Medications:     carvedilol  3.125 mg Oral BID WC   diltiazem  180 mg Oral q morning   furosemide  20 mg Intravenous BID   heparin  5,000 Units Subcutaneous Q8H   hydrocerin   Topical Daily   levothyroxine  125 mcg Oral Once per day on Mon Tue Wed Thu Fri   pantoprazole  40 mg Oral Daily   predniSONE  5 mg Oral Q breakfast   rosuvastatin  20 mg Oral Daily   urea  15 g Oral BID   acetaminophen **OR** acetaminophen, bisacodyl, dextromethorphan-guaiFENesin, ondansetron **OR** ondansetron (ZOFRAN) IV, traMADol  Assessment/ Plan:  Euvolemic hyponatremia.  Patient has been treated with 3% saline which has been discontinued on 07/14/2021.  Also on a 1 L fluid restriction.  Receiving Lasix 20 mg IV twice daily.  Good urine output.  We will continue to follow hopefully should resolve    LOS: [place] '@TODAY''@7'$ :24 AM

## 2021-07-15 NOTE — Progress Notes (Signed)
[facility] informed via chat patient is requesting sleep medication and a k-pad ?

## 2021-07-15 NOTE — Progress Notes (Signed)
Physical Therapy Treatment ?Patient Details ?Name: Lorraine Brandt ?MRN: [id number] ?DOB: [birth year] ?Today's Date: 07/15/2021 ? ? ?History of Present Illness 88 y.o. female, who has been gradually getting weaker over the last few days, also has developed problems laying flat as she gets short of breath. She presented to the ER 07/12/21 where she was found to have severe hyponatremia--likely SIADH.  She sustained 2 falls over the last 2 weeks and one of the falls she hurt her neck, she was diagnosed with C6-C7 fracture she saw neurosurgery outpatient and was placed in a c-collar.  PMH- osteoarthritis, hypothyroidism, hypertension, polymyalgia rheumatica, dyslipidemia , ? ?  ?PT Comments  ? ? Pt admitted with above diagnosis. Pt was able to ambulate with RW with min assist and mod cues as she has poor safety awareness.  Pt wanted to go to bathroom to have BM but was unable to use the bathroom once there. She walked back to chair. Pt needs SNF for therapy prior to return to I living and pt agrees.  Pt currently with functional limitations due to balance and endurance deficits. Pt will benefit from skilled PT to increase their independence and safety with mobility to allow discharge to the venue listed below.      ?Recommendations for follow up therapy are one component of a multi-disciplinary discharge planning process, led by the attending physician.  Recommendations may be updated based on patient status, additional functional criteria and insurance authorization. ? ?Follow Up Recommendations ? Skilled nursing-short term rehab (<3 hours/day) ?  ?  ?Assistance Recommended at Discharge Frequent or constant Supervision/Assistance  ?Patient can return home with the following A lot of help with walking and/or transfers;A lot of help with bathing/dressing/bathroom;Assistance with cooking/housework;Assist for transportation ?  ?Equipment Recommendations ? Rollator (4 wheels)  ?  ?Recommendations for Other Services OT  consult ? ? ?  ?Precautions / Restrictions Precautions ?Precautions: Fall ?Precaution Comments: 2 falls in past week ?Required Braces or Orthoses: Cervical Brace ?Cervical Brace: Hard collar;At all times ?Restrictions ?Weight Bearing Restrictions: No  ?  ? ?Mobility ? Bed Mobility ?  ?  ?  ?  ?  ?  ?  ?General bed mobility comments: up in recliner ?  ? ?Transfers ?Overall transfer level: Needs assistance ?Equipment used: Rolling walker (2 wheels) ?Transfers: Sit to/from Stand ?Sit to Stand: Min assist, Mod assist ?  ?  ?  ?  ?  ?General transfer comment: Able to stand with a little assist and mod cues for hand placement.  Pt did use momentum. ?  ? ?Ambulation/Gait ?Ambulation/Gait assistance: Min assist ?Gait Distance (Feet): 50 Feet ?Assistive device: Rolling walker (2 wheels) ?Gait Pattern/deviations: Shuffle, Trunk flexed, Wide base of support, Drifts right/left, Antalgic, Decreased weight shift to right, Decreased weight shift to left, Decreased step length - right, Decreased step length - left, Leaning posteriorly ?  ?Gait velocity interpretation: <1.31 ft/sec, indicative of household ambulator ?  ?General Gait Details: Pt wanted to ambulate to batrhoom to have BM.  Pt needed cues to stay close to RW as she kept it too far in front of her. Pt also needed assist to steer the RW. Pt with flexed posture and overall poor safety awareness.  Constant cues for walker management. Pt also taking shuffle steps throughout. Could incr step length with cues but needs constant cues. ? ? ?Stairs ?  ?  ?  ?  ?  ? ? ?Wheelchair Mobility ?  ? ?Modified Rankin (Stroke Patients Only) ?  ? ? ?  ?  Balance Overall balance assessment: Needs assistance ?Sitting-balance support: No upper extremity supported, Feet supported ?Sitting balance-Leahy Scale: Fair ?  ?  ?Standing balance support: Bilateral upper extremity supported, During functional activity ?Standing balance-Leahy Scale: Poor ?Standing balance comment: posterior lean in  standing ?  ?  ?  ?  ?  ?  ?  ?  ?  ?  ?  ?  ? ?  ?Cognition Arousal/Alertness: Awake/alert ?Behavior During Therapy: [facility] for tasks assessed/performed ?Overall Cognitive Status: Within Functional Limits for tasks assessed ?  ?  ?  ?  ?  ?  ?  ?  ?  ?  ?  ?  ?  ?  ?  ?  ?  ?  ?  ? ?  ?Exercises   ? ?  ?General Comments General comments (skin integrity, edema, etc.): 95% on RA.  BP sitting 126/68 with HR 93 bpm.  STand 90/78 but pt not dizzy.  Then BP after 3 min 96/80.  Pt without c/o dizziness. ?  ?  ? ?Pertinent Vitals/Pain Pain Assessment ?Pain Assessment: No/denies pain  ? ? ?Home Living   ?  ?  ?  ?  ?  ?  ?  ?  ?  ?   ?  ?Prior Function    ?  ?  ?   ? ?PT Goals (current goals can now be found in the care plan section) Acute Rehab PT Goals ?Patient Stated Goal: wants to be strong enough to not fall ?Progress towards PT goals: Progressing toward goals ? ?  ?Frequency ? ? ? Min 3X/week ? ? ? ?  ?PT Plan Current plan remains appropriate  ? ? ?Co-evaluation   ?  ?  ?  ?  ? ?  ?AM-PAC PT "6 Clicks" Mobility   ?Outcome Measure ? Help needed turning from your back to your side while in a flat bed without using bedrails?: A Little ?Help needed moving from lying on your back to sitting on the side of a flat bed without using bedrails?: A Little ?Help needed moving to and from a bed to a chair (including a wheelchair)?: A Little ?Help needed standing up from a chair using your arms (e.g., wheelchair or bedside chair)?: A Lot ?Help needed to walk in hospital room?: A Lot ?Help needed climbing 3-5 steps with a railing? : Total ?6 Click Score: 14 ? ?  ?End of Session Equipment Utilized During Treatment: Gait belt ?Activity Tolerance: Other (comment) (weakness) ?Patient left: in chair;with call bell/phone within reach;with chair alarm set ?Nurse Communication: Mobility status ?PT Visit Diagnosis: Difficulty in walking, not elsewhere classified (R26.2);Muscle weakness (generalized) (M62.81);History of falling (Z91.81) ?   ? ? ?Time: 4888-9169 ?PT Time Calculation (min) (ACUTE ONLY): 23 min ? ?Charges:  $Gait Training: 23-37 mins          ?          ? ?Maxim Bedel M,PT ?Acute Rehab Services ?[phone] ?[phone] (pager)  ? ? ?Alvira Philips ?07/15/2021, 3:59 PM ? ?

## 2021-07-15 NOTE — TOC Initial Note (Addendum)
Transition of Care (TOC) - Initial/Assessment Note  ? ? ?Patient Details  ?Name: Lorraine Brandt ?MRN: [id number] ?Date of Birth: 12-04-1933 ? ?Transition of Care (TOC) CM/SW Contact:    ?Benard Halsted, LCSW ?Phone Number: ?07/15/2021, 9:17 AM ? ?Clinical Narrative:                 ?9:17am-CSW left voicemail for Katie at [facility] in the event that patient will require SNF at discharge (they no longer do SNF at their [facility] location). Therapy to see patient again today.  ? ?11:45am-Katie returned CSW's call and stated that patient does have a spot on the SNF side if needed. No covid test required. Will continue to follow for recommendations.  ? ?  ?Barriers to Discharge: Continued Medical Work up ? ? ?Patient Goals and CMS Choice ?Patient states their goals for this hospitalization and ongoing recovery are:: Rehab ?  ?  ? ?Expected Discharge Plan and Services ?  ?In-house Referral: Clinical Social Work ?  ?  ?Living arrangements for the past 2 months: [place] ?                ?  ?  ?  ?  ?  ?  ?  ?  ?  ?  ? ?Prior Living Arrangements/Services ?Living arrangements for the past 2 months: [place] ?Lives with:: Spouse ?Patient language and need for interpreter reviewed:: Yes ?Do you feel safe going back to the place where you live?: Yes      ?Need for Family Participation in Patient Care: No (Comment) ?Care giver support system in place?: Yes (comment) ?  ?Criminal Activity/Legal Involvement Pertinent to Current Situation/Hospitalization: No - Comment as needed ? ?Activities of Daily Living ?Home Assistive Devices/Equipment: C-collar, Eyeglasses, Hearing aid, Walker (specify type) (standard) ?ADL Screening (condition at time of admission) ?Patient's cognitive ability adequate to safely complete daily activities?: Yes ?Is the patient deaf or have difficulty hearing?: Yes (hearing aids) ?Does the patient have difficulty seeing, even when wearing glasses/contacts?:  No ?Does the patient have difficulty concentrating, remembering, or making decisions?: No ?Patient able to express need for assistance with ADLs?: Yes ?Does the patient have difficulty dressing or bathing?: Yes (recent change due to fall/injury) ?Independently performs ADLs?: Yes (appropriate for developmental age) ?Does the patient have difficulty walking or climbing stairs?: Yes ?Weakness of Legs: Both ?Weakness of Arms/Hands: None ? ?Permission Sought/Granted ?Permission sought to share information with : Facility Sport and exercise psychologist, Family Supports ?Permission granted to share information with : Yes, Verbal Permission Granted ? Share Information with NAME: [name] ? Permission granted to share info w AGENCY: [facility] ? Permission granted to share info w Relationship: Daughter ? Permission granted to share info w Contact Information: [phone] ? ?Emotional Assessment ?Appearance:: Appears stated age ?  ?  ?Orientation: : Oriented to Self, Oriented to Place, Oriented to  Time, Oriented to Situation ?Alcohol / Substance Use: Not Applicable ?Psych Involvement: No (comment) ? ?Admission diagnosis:  Hyponatremia [E87.1] ?Atrial flutter, unspecified type ([place]) [I48.92] ?Patient Active Problem List  ? Diagnosis Date Noted  ? Hyponatremia 07/12/2021  ? Closed C6 fracture ([place]) 07/12/2021  ? Fall at home, initial encounter 07/12/2021  ? Acute on chronic diastolic CHF (congestive heart failure) ([place]) 07/12/2021  ? Retinal artery occlusion   ? Central retinal artery occlusion 07/07/2019  ? Essential hypertension 07/07/2019  ? Pure hypercholesterolemia 07/07/2019  ? Postoperative state 11/02/2014  ? OA (osteoarthritis) of knee 04/25/2014  ? ?PCP:  Reynold Bowen, MD ?Pharmacy:   ?[facility]) - [place], [address] ?[address] ?[address] ?[place] [address] ?Phone: [phone] Fax: [phone] ? ?[id number] [id number]?Phone: [phone]: [phone]? ? ? ? ?Social Determinants of Health (SDOH) Interventions ?  ? ?Readmission Risk Interventions ?No flowsheet data found. ? ? ?

## 2021-07-15 NOTE — Care Management Important Message (Signed)
Important Message ? ?Patient Details  ?Name: Lorraine Brandt ?MRN: [id number] ?Date of Birth: 1933/06/16 ? ? ?Medicare Important Message Given:  Yes ? ? ? ? ?Kenia Teagarden ?07/15/2021, 3:04 PM ?

## 2021-07-15 NOTE — Telephone Encounter (Signed)
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 Pt asked that we deliver this message to [place] ? ?Routed to [place]  ?

## 2021-07-16 DIAGNOSIS — I4892 Unspecified atrial flutter: Secondary | ICD-10-CM

## 2021-07-16 DIAGNOSIS — I5033 Acute on chronic diastolic (congestive) heart failure: Secondary | ICD-10-CM

## 2021-07-16 LAB — CBC WITH DIFFERENTIAL/PLATELET
Abs Immature Granulocytes: 0.37 10*3/uL — ABNORMAL HIGH (ref 0.00–0.07)
Basophils Absolute: 0.1 10*3/uL (ref 0.0–0.1)
Basophils Relative: 0 %
Eosinophils Absolute: 0.1 10*3/uL (ref 0.0–0.5)
Eosinophils Relative: 1 %
HCT: 34.5 % — ABNORMAL LOW (ref 36.0–46.0)
Hemoglobin: 12.7 g/dL (ref 12.0–15.0)
Immature Granulocytes: 3 %
Lymphocytes Relative: 6 %
Lymphs Abs: 0.7 10*3/uL (ref 0.7–4.0)
MCH: 33.4 pg (ref 26.0–34.0)
MCHC: 36.8 g/dL — ABNORMAL HIGH (ref 30.0–36.0)
MCV: 90.8 fL (ref 80.0–100.0)
Monocytes Absolute: 0.6 10*3/uL (ref 0.1–1.0)
Monocytes Relative: 5 %
Neutro Abs: 9.8 10*3/uL — ABNORMAL HIGH (ref 1.7–7.7)
Neutrophils Relative %: 85 %
Platelets: 249 10*3/uL (ref 150–400)
RBC: 3.8 MIL/uL — ABNORMAL LOW (ref 3.87–5.11)
RDW: 15.7 % — ABNORMAL HIGH (ref 11.5–15.5)
WBC: 11.6 10*3/uL — ABNORMAL HIGH (ref 4.0–10.5)
nRBC: 0 % (ref 0.0–0.2)

## 2021-07-16 LAB — SODIUM
Sodium: 124 mmol/L — ABNORMAL LOW (ref 135–145)
Sodium: 124 mmol/L — ABNORMAL LOW (ref 135–145)
Sodium: 126 mmol/L — ABNORMAL LOW (ref 135–145)

## 2021-07-16 LAB — COMPREHENSIVE METABOLIC PANEL
ALT: 32 U/L (ref 0–44)
AST: 26 U/L (ref 15–41)
Albumin: 2.9 g/dL — ABNORMAL LOW (ref 3.5–5.0)
Alkaline Phosphatase: 93 U/L (ref 38–126)
Anion gap: 10 (ref 5–15)
BUN: 40 mg/dL — ABNORMAL HIGH (ref 8–23)
CO2: 27 mmol/L (ref 22–32)
Calcium: 8.9 mg/dL (ref 8.9–10.3)
Chloride: 89 mmol/L — ABNORMAL LOW (ref 98–111)
Creatinine, Ser: 0.56 mg/dL (ref 0.44–1.00)
GFR, Estimated: >60 mL/min (ref >60)
Glucose, Bld: 99 mg/dL (ref 70–99)
Potassium: 3.9 mmol/L (ref 3.5–5.1)
Sodium: 126 mmol/L — ABNORMAL LOW (ref 135–145)
Total Bilirubin: 1.1 mg/dL (ref 0.3–1.2)
Total Protein: 5.4 g/dL — ABNORMAL LOW (ref 6.5–8.1)

## 2021-07-16 LAB — BASIC METABOLIC PANEL
Anion gap: 10 (ref 5–15)
BUN: 61 mg/dL — ABNORMAL HIGH (ref 8–23)
CO2: 25 mmol/L (ref 22–32)
Calcium: 8.9 mg/dL (ref 8.9–10.3)
Chloride: 91 mmol/L — ABNORMAL LOW (ref 98–111)
Creatinine, Ser: 1.04 mg/dL — ABNORMAL HIGH (ref 0.44–1.00)
GFR, Estimated: 53 mL/min — ABNORMAL LOW (ref >60)
Glucose, Bld: 145 mg/dL — ABNORMAL HIGH (ref 70–99)
Potassium: 3.6 mmol/L (ref 3.5–5.1)
Sodium: 126 mmol/L — ABNORMAL LOW (ref 135–145)

## 2021-07-16 LAB — GLUCOSE, CAPILLARY: Glucose-Capillary: 145 mg/dL — ABNORMAL HIGH (ref 70–99)

## 2021-07-16 LAB — C-REACTIVE PROTEIN: CRP: 6.3 mg/dL — ABNORMAL HIGH (ref <1.0)

## 2021-07-16 LAB — MAGNESIUM: Magnesium: 2 mg/dL (ref 1.7–2.4)

## 2021-07-16 LAB — BRAIN NATRIURETIC PEPTIDE: B Natriuretic Peptide: 83.6 pg/mL (ref 0.0–100.0)

## 2021-07-16 MED ORDER — ENSURE ENLIVE PO LIQD
237.0000 mL | Freq: Two times a day (BID) | ORAL | Status: DC
  Administered 2021-07-16 – 2021-07-18 (×5): 237 mL via ORAL

## 2021-07-16 MED ORDER — BISACODYL 10 MG RE SUPP
10.0000 mg | Freq: Once | RECTAL | Status: AC
  Administered 2021-07-16: 10 mg via RECTAL
  Filled 2021-07-16: qty 1

## 2021-07-16 MED ORDER — PREDNISONE 5 MG PO TABS
5.0000 mg | ORAL_TABLET | ORAL | Status: DC
  Administered 2021-07-17: 5 mg via ORAL
  Filled 2021-07-16: qty 1

## 2021-07-16 MED ORDER — POLYETHYLENE GLYCOL 3350 17 G PO PACK
34.0000 g | PACK | Freq: Once | ORAL | Status: AC
Start: 2021-07-16 — End: 2021-07-16
  Administered 2021-07-16: 34 g via ORAL
  Filled 2021-07-16: qty 2

## 2021-07-16 MED ORDER — LEVOTHYROXINE SODIUM 100 MCG PO TABS
100.0000 ug | ORAL_TABLET | ORAL | Status: DC
  Administered 2021-07-17 – 2021-07-18 (×2): 100 ug via ORAL
  Filled 2021-07-16 (×2): qty 1

## 2021-07-16 NOTE — Plan of Care (Signed)

## 2021-07-16 NOTE — Progress Notes (Signed)
Occupational Therapy Treatment Patient Details Name: Lorraine Brandt MRN: [id number] DOB: 07-28-1933 Today's Date: 07/16/2021   History of present illness 88 y.o. female, who has been gradually getting weaker over the last few days, also has developed problems laying flat as she gets short of breath. She presented to the ER 07/12/21 where she was found to have severe hyponatremia--likely SIADH.  She sustained 2 falls over the last 2 weeks and one of the falls she hurt her neck, she was diagnosed with C6-C7 fracture she saw neurosurgery outpatient and was placed in a c-collar.  PMH- osteoarthritis, hypothyroidism, hypertension, polymyalgia rheumatica, dyslipidemia ,   OT comments  Pt remains eager to participate in therapies. Session focused on in-room mobility using RW and ADL completion seated at sink. Pt requires consistent cueing for sequencing tasks, DME use, and to maintain cervical precautions throughout. Pt able to progress UB ADLs to Setup-Min A at sink though continues to require extensive assist for LB ADLs due to weakness, balance deficits and reports of burning in B feet (RN aware). Pt requires Mod A for short mobility and at increased risk for falls with current deficits. Continue to rec SNF rehab at DC.  VSS on RA   Recommendations for follow up therapy are one component of a multi-disciplinary discharge planning process, led by the attending physician.  Recommendations may be updated based on patient status, additional functional criteria and insurance authorization.    Follow Up Recommendations  Skilled nursing-short term rehab (<3 hours/day)    Assistance Recommended at Discharge Frequent or constant Supervision/Assistance  Patient can return home with the following  A lot of help with walking and/or transfers;A lot of help with bathing/dressing/bathroom;Direct supervision/assist for financial management;Help with stairs or ramp for entrance;Assist for transportation    Equipment Recommendations  None recommended by OT    Recommendations for Other Services      Precautions / Restrictions Precautions Precautions: Fall;Cervical Precaution Booklet Issued: Yes (comment) Precaution Comments: 2 falls in past week Required Braces or Orthoses: Cervical Brace Cervical Brace: Hard collar;At all times Restrictions Weight Bearing Restrictions: No       Mobility Bed Mobility Overal bed mobility: Needs Assistance Bed Mobility: Sidelying to Sit, Rolling Rolling: Supervision Sidelying to sit: Mod assist       General bed mobility comments: Guided in log rolling. Mod A to lift trunk and scoot hips to EOB    Transfers Overall transfer level: Needs assistance Equipment used: Rolling walker (2 wheels) Transfers: Sit to/from Stand Sit to Stand: Mod assist           General transfer comment: Mod A to stand from bed and chair w/o armrests at sink (x3 during ADLs). consistent cueing needed for hand placement, use of momentum, etc with pt difficulty getting bottom to upright position     Balance Overall balance assessment: Needs assistance Sitting-balance support: No upper extremity supported, Feet supported Sitting balance-Leahy Scale: Fair     Standing balance support: Bilateral upper extremity supported, During functional activity Standing balance-Leahy Scale: Poor                             ADL either performed or assessed with clinical judgement   ADL Overall ADL's : Needs assistance/impaired Eating/Feeding: Set up;Sitting Eating/Feeding Details (indicate cue type and reason): assist to open mustard packet; placed napkin in cervical collar to decrease spillage Grooming: Set up;Sitting;Brushing hair;Oral care;Applying deodorant;Wash/dry face Grooming Details (indicate cue type and  reason): seated at sink Upper Body Bathing: Minimal assistance;Sitting Upper Body Bathing Details (indicate cue type and reason): to bathe back Lower  Body Bathing: Moderate assistance;Sit to/from stand Lower Body Bathing Details (indicate cue type and reason): for lower LEs, bottom in standing Upper Body Dressing : Set up;Sitting   Lower Body Dressing: Maximal assistance;Sitting/lateral leans;Sit to/from stand Lower Body Dressing Details (indicate cue type and reason): able to cross LEs but difficulty coordinating for dressing tasks. A lot of difficulty pushing B heels into shoes (likely from LE weakness). Assist for doffing/donning mesh underwear due to poor balance in standing             Functional mobility during ADLs: Moderate assistance;Rolling walker (2 wheels) General ADL Comments: Guided pt in ADLs seated at sink, progressing mobility to/from sink and to recliner with RW at end of session. Consistent cueing needed for DME mgmt, cervical precautions and sequencing    Extremity/Trunk Assessment Upper Extremity Assessment Upper Extremity Assessment: RUE deficits/detail;LUE deficits/detail RUE Deficits / Details: reports mild burning in hands at end of session LUE Deficits / Details: reports mild burning in hands at end of session   Lower Extremity Assessment Lower Extremity Assessment: Defer to PT evaluation        Vision   Vision Assessment?: No apparent visual deficits   Perception     Praxis      Cognition Arousal/Alertness: Awake/alert Behavior During Therapy: WFL for tasks assessed/performed, Anxious Overall Cognitive Status: Impaired/Different from baseline Area of Impairment: Memory, Attention, Following commands, Safety/judgement, Awareness, Problem solving                   Current Attention Level: Sustained Memory: Decreased short-term memory Following Commands: Follows one step commands with increased time Safety/Judgement: Decreased awareness of deficits, Decreased awareness of safety Awareness: Intellectual Problem Solving: Slow processing, Requires verbal cues, Requires tactile cues General  Comments: Pt pleasant, eager to improve. limited by anxiety vs cognitive impairments with frequent cues needed for cervical precautions, hand placement with transitional movements and general awareness during session.        Exercises      Shoulder Instructions       General Comments VSS on RA    Pertinent Vitals/ Pain       Pain Assessment Pain Assessment: Faces Faces Pain Scale: Hurts little more Pain Location: B feet Pain Descriptors / Indicators: Burning Pain Intervention(s): Monitored during session, Patient requesting pain meds-RN notified  Home Living                                          Prior Functioning/Environment              Frequency  Min 2X/week        Progress Toward Goals  OT Goals(current goals can now be found in the care plan section)  Progress towards OT goals: Progressing toward goals  Acute Rehab OT Goals Patient Stated Goal: get up and walk more OT Goal Formulation: With patient Time For Goal Achievement: 2021-07-29 Potential to Achieve Goals: Good ADL Goals Pt Will Perform Lower Body Bathing: with modified independence;sitting/lateral leans;sit to/from stand Pt Will Perform Lower Body Dressing: with modified independence;sitting/lateral leans;sit to/from stand Pt Will Transfer to Toilet: with modified independence;ambulating Pt Will Perform Toileting - Clothing Manipulation and hygiene: with modified independence;sitting/lateral leans;sit to/from stand Additional ADL Goal #1: Pt will recall 3  energy conservation techniques that she can use at home Additional ADL Goal #2: Pt will tolerate standing for 5 mins at the sink with independence as precursor to standing ADL's.  Plan Discharge plan remains appropriate    Co-evaluation                 AM-PAC OT "6 Clicks" Daily Activity     Outcome Measure   Help from another person eating meals?: A Little Help from another person taking care of personal grooming?:  A Little Help from another person toileting, which includes using toliet, bedpan, or urinal?: A Lot Help from another person bathing (including washing, rinsing, drying)?: A Lot Help from another person to put on and taking off regular upper body clothing?: A Little Help from another person to put on and taking off regular lower body clothing?: A Lot 6 Click Score: 15    End of Session Equipment Utilized During Treatment: Gait belt;Rolling walker (2 wheels);Cervical collar  OT Visit Diagnosis: Unsteadiness on feet (R26.81);Other abnormalities of gait and mobility (R26.89);Muscle weakness (generalized) (M62.81)   Activity Tolerance Patient tolerated treatment well   Patient Left in chair;with call bell/phone within reach;with chair alarm set   Nurse Communication Mobility status        Time: 6237-6283 OT Time Calculation (min): 46 min  Charges: OT General Charges $OT Visit: 1 Visit OT Treatments $Self Care/Home Management : 38-52 mins  Malachy Chamber, OTR/L Acute Rehab Services Office: [phone]   Layla Maw 07/16/2021, 1:40 PM

## 2021-07-16 NOTE — TOC Progression Note (Signed)
Transition of Care (TOC) - Progression Note  ? ? ?Patient Details  ?Name: Lorraine Brandt ?MRN: [id number] ?Date of Birth: Mar 25, 1934 ? ?Transition of Care (TOC) CM/SW Contact  ?Benard Halsted, LCSW ?Phone Number: ?07/16/2021, 11:31 AM ? ?Clinical Narrative:    ?CSW spoke with patient at bedside regarding discharge plan. She is requesting SNF at discharge and is aware that it will take place at [place]. She will require PTAR for transport.  ? ? ?Expected Discharge Plan: [place] ?Barriers to Discharge: Continued Medical Work up ? ?Expected Discharge Plan and Services ?Expected Discharge Plan: [place] ?In-house Referral: Clinical Social Work ?  ?Post Acute Care Choice: [place] ?Living arrangements for the past 2 months: [place] ?                ?  ?  ?  ?  ?  ?  ?  ?  ?  ?  ? ? ?Social Determinants of Health (SDOH) Interventions ?  ? ?Readmission Risk Interventions ?No flowsheet data found. ? ?

## 2021-07-16 NOTE — Progress Notes (Signed)
[facility] ROUNDING NOTE   Subjective:   Interval History: 88 year old lady history of falls with new euvolemic hypotonic hyponatremia and a diagnosis of likely SIADH.  She has been treated with saline 3% 07/13/2021-07/14/2021.  She was placed on fluid restriction.  She initially presented with some confusion but is now neurologically intact.  She does have a history of PMR and has been on chronic corticosteroids as well as a history of hypothyroidism on thyroid replacement.  Blood pressure 160/74 pulse 61 temperature 97.4 O2 sats 90%  Sodium 126 potassium 3.9 chloride 89 CO2 27 BUN 40 creatinine 0.56 glucose 99.  Albumin 2.9 hemoglobin 12.7  Urine output 1.2 L 07/16/2020  Objective:  Vital signs in last 24 hours:  Temp:  [97.5 F (36.4 C)-98.3 F (36.8 C)] 97.6 F (36.4 C) (03/07 0752) Pulse Rate:  [61-84] 61 (03/07 0752) Resp:  [15-19] 16 (03/07 0752) BP: (107-166)/(70-122) 160/74 (03/07 0752) SpO2:  [95 %-97 %] 95 % (03/07 0752)  Weight change:  Filed Weights   07/12/21 0922 07/13/21 0120  Weight: 56.7 kg 60.2 kg    Intake/Output: I/O last 3 completed shifts: In: 360 [P.O.:360] Out: 3050 [Urine:3050]   Intake/Output this shift:  No intake/output data recorded.  CVS- RRR RS- CTA ABD- BS present soft non-distended EXT- no edema   Basic Metabolic Panel: Recent Labs  Lab 07/12/21 0945 07/12/21 1005 07/12/21 1525 07/13/21 0201 07/13/21 1440 07/14/21 0420 07/14/21 0735 07/14/21 2359 07/15/21 0751 07/15/21 1143 07/15/21 1608 07/15/21 1943 07/16/21 0026 07/16/21 0352  NA 114* 114*   < > 116*   116*   < > 120*   < > 123*   < > 122* 121* 120* 124* 126*  K 3.8 3.7  --  3.2*  --  4.1  --  3.7  --   --   --   --   --  3.9  CL 82* 80*  --  83*  --  89*  --  88*  --   --   --   --   --  89*  CO2 24  --   --  27  --  25  --  27  --   --   --   --   --  27  GLUCOSE 86 88  --  91  --  100*  --  114*  --   --   --   --   --  99  BUN 16 19  --  12  --  12   --  45*  --   --   --   --   --  40*  CREATININE 0.46 0.40*  --  0.40*  --  0.48  --  0.59  --   --   --   --   --  0.56  CALCIUM 7.9*  --   --  7.7*  --  7.6*  --  8.2*  --   --   --   --   --  8.9  MG 1.8  --   --  1.7  --  2.2  --  2.0  --   --   --   --   --  2.0   < > = values in this interval not displayed.     Liver Function Tests: Recent Labs  Lab 07/12/21 0945 07/13/21 0201 07/14/21 0420 07/14/21 2359 07/16/21 0352  AST '27 19 17 24 26  '$ ALT 25 25 25  29 32  ALKPHOS 73 79 86 92 93  BILITOT 1.3* 1.0 0.5 0.5 1.1  PROT 5.3* 4.9* 5.0* 5.1* 5.4*  ALBUMIN 3.3* 2.9* 2.8* 2.8* 2.9*    No results for input(s): LIPASE, AMYLASE in the last 168 hours. No results for input(s): AMMONIA in the last 168 hours.  CBC: Recent Labs  Lab 07/12/21 0945 07/12/21 1005 07/13/21 0201 07/14/21 0420 07/14/21 2359 07/16/21 0352  WBC 15.5*  --  14.3* 12.2* 11.0* 11.6*  NEUTROABS 12.5*  --  11.9* 10.7* 9.5* 9.8*  HGB 13.5 13.6 12.6 12.0 11.8* 12.7  HCT 36.5 40.0 33.9* 32.4* 32.6* 34.5*  MCV 89.5  --  89.4 89.5 91.8 90.8  PLT 268  --  254 240 235 249     Cardiac Enzymes: No results for input(s): CKTOTAL, CKMB, CKMBINDEX, TROPONINI in the last 168 hours.  BNP: Invalid input(s): POCBNP  CBG: No results for input(s): GLUCAP in the last 168 hours.  Microbiology: Results for orders placed or performed during the hospital encounter of 07/12/21  Resp Panel by RT-PCR (Flu A&B, Covid) Nasopharyngeal Swab     Status: None   Collection Time: 07/12/21  9:42 AM   Specimen: Nasopharyngeal Swab; Nasopharyngeal(NP) swabs in vial transport medium  Result Value Ref Range Status   SARS Coronavirus 2 by RT PCR NEGATIVE NEGATIVE Final    Comment: (NOTE) SARS-CoV-2 target nucleic acids are NOT DETECTED.  The SARS-CoV-2 RNA is generally detectable in upper respiratory specimens during the acute phase of infection. The lowest concentration of SARS-CoV-2 viral copies this assay can detect is 138  copies/mL. A negative result does not preclude SARS-Cov-2 infection and should not be used as the sole basis for treatment or other patient management decisions. A negative result may occur with  improper specimen collection/handling, submission of specimen other than nasopharyngeal swab, presence of viral mutation(s) within the areas targeted by this assay, and inadequate number of viral copies(<138 copies/mL). A negative result must be combined with clinical observations, patient history, and epidemiological information. The expected result is Negative.  Fact Sheet for Patients:  EntrepreneurPulse.com.au  Fact Sheet for Healthcare Providers:  IncredibleEmployment.be  This test is no t yet approved or cleared by the [place] FDA and  has been authorized for detection and/or diagnosis of SARS-CoV-2 by FDA under an Emergency Use Authorization (EUA). This EUA will remain  in effect (meaning this test can be used) for the duration of the COVID-19 declaration under Section 564(b)(1) of the Act, 21 U.S.C.section 360bbb-3(b)(1), unless the authorization is terminated  or revoked sooner.       Influenza A by PCR NEGATIVE NEGATIVE Final   Influenza B by PCR NEGATIVE NEGATIVE Final    Comment: (NOTE) The Xpert Xpress SARS-CoV-2/FLU/RSV plus assay is intended as an aid in the diagnosis of influenza from Nasopharyngeal swab specimens and should not be used as a sole basis for treatment. Nasal washings and aspirates are unacceptable for Xpert Xpress SARS-CoV-2/FLU/RSV testing.  Fact Sheet for Patients: EntrepreneurPulse.com.au  Fact Sheet for Healthcare Providers: IncredibleEmployment.be  This test is not yet approved or cleared by the [place] FDA and has been authorized for detection and/or diagnosis of SARS-CoV-2 by FDA under an Emergency Use Authorization (EUA). This EUA will remain in effect (meaning  this test can be used) for the duration of the COVID-19 declaration under Section 564(b)(1) of the Act, 21 U.S.C. section 360bbb-3(b)(1), unless the authorization is terminated or revoked.  Performed at [facility], [place]  [address] [place], [place] [address]     Coagulation Studies: No results for input(s): LABPROT, INR in the last 72 hours.  Urinalysis: No results for input(s): COLORURINE, LABSPEC, PHURINE, GLUCOSEU, HGBUR, BILIRUBINUR, KETONESUR, PROTEINUR, UROBILINOGEN, NITRITE, LEUKOCYTESUR in the last 72 hours.  Invalid input(s): APPERANCEUR     Imaging: CT CHEST WO CONTRAST  Result Date: 07/14/2021 CLINICAL DATA:  Cough for greater than 8 weeks. Progressive weakness. EXAM: CT CHEST WITHOUT CONTRAST TECHNIQUE: Multidetector CT imaging of the chest was performed following the standard protocol without IV contrast. RADIATION DOSE REDUCTION: This exam was performed according to the departmental dose-optimization program which includes automated exposure control, adjustment of the mA and/or kV according to patient size and/or use of iterative reconstruction technique. COMPARISON:  None. FINDINGS: Cardiovascular: Normal heart size. No pericardial effusion. Aortic atherosclerosis and coronary artery calcifications. Mediastinum/Nodes: No enlarged mediastinal or axillary lymph nodes. Thyroid gland, trachea, and esophagus demonstrate no significant findings. Lungs/Pleura: No pleural effusion, airspace consolidation, atelectasis, or pneumothorax. No interstitial edema. Signs of chronic interstitial lung disease identified with peripheral predominant subpleural reticulation, traction bronchiectasis and multifocal patchy areas of ground-glass attenuation. Thickening of the peribronchovascular interstitium is also identified. No frank honeycombing noted. Calcified granuloma is noted within the left upper lobe. Upper Abdomen: No acute findings within the imaged portions of the upper abdomen.  Subcapsular cyst within medial segment of left hepatic lobe measures 9 mm. Aortic atherosclerotic calcifications. Musculoskeletal: Thoracolumbar scoliosis deformity with mild degenerative disc disease. No acute or suspicious osseous findings. IMPRESSION: 1. No acute cardiopulmonary abnormality. 2. Signs of chronic interstitial lung disease including peripheral subpleural reticulation, traction bronchiectasis and multifocal areas of ground-glass attenuation. Recommend follow-up imaging with high-resolution CT of the chest in 6 months to assess for temporal change in the appearance of the lungs. 3. Thoracolumbar scoliosis and degenerative disc disease. 4. Aortic Atherosclerosis (ICD10-I70.0). Electronically Signed   By: Kerby Moors M.D.   On: 07/14/2021 14:38     Medications:     carvedilol  3.125 mg Oral BID WC   diltiazem  180 mg Oral q morning   furosemide  20 mg Intravenous BID   heparin  5,000 Units Subcutaneous Q8H   hydrALAZINE  50 mg Oral Q8H   hydrocerin   Topical Daily   levothyroxine  125 mcg Oral Once per day on Mon Tue Wed Thu Fri   pantoprazole  40 mg Oral Daily   predniSONE  5 mg Oral Q breakfast   rosuvastatin  20 mg Oral Daily   urea  15 g Oral BID   acetaminophen **OR** acetaminophen, bisacodyl, dextromethorphan-guaiFENesin, melatonin, ondansetron **OR** ondansetron (ZOFRAN) IV, traMADol  Assessment/ Plan:  Euvolemic hyponatremia.  Patient has been treated with 3% saline which has been discontinued on 07/14/2021.  Also on a 1 L fluid restriction.  Receiving Lasix 20 mg IV twice daily.  Good urine output.  Continues to resolve. Continue oral furosemide and fluid restriction.  We will sign off    LOS: 4 Sherril Croon '@TODAY''@8'$ :03 AM

## 2021-07-16 NOTE — Progress Notes (Deleted)
PROGRESS NOTE                                                                                                                                                                                                             Patient Demographics:    Lorraine Brandt, is a 88 y.o. female, DOB - April 07, 1934, FHQ:[id number]  Outpatient Primary MD for the patient is Reynold Bowen, MD    LOS - 4  Admit date - 07/12/2021    Chief Complaint  Patient presents with   Altered Mental Status       Brief Narrative (HPI from H&P)      Lorraine Brandt  is a 88 y.o. female, with history of osteoarthritis, hypothyroidism, hypertension, polymyalgia rheumatica, dyslipidemia who has been gradually getting weaker over the last few days, also has developed problems laying flat as she gets short of breath, she sustained 2 falls over the last 2 weeks and one of the falls she hurt her neck, she was diagnosed with C6-C7 fracture she saw neurosurgery outpatient and was placed in a c-collar.  She was also placed on a steroid taper.  She continues to get weak and presented to the ER today where she was found to have severe hyponatremia.   Subjective:   Patient in bed in no distress wearing her c-collar denies any headache or chest pain, still has wet cough, no phlegm, no abdominal pain or focal weakness.   Assessment  & Plan :     1.  Generalized weakness, falls at home due to severe hyponatremia.  Patient appears quite frail and deconditioned, due to her falls  she has sustained a C6-C7 fracture as well -she has neck pain was on Lexapro which could cause SIADH, urine sodium is 77 and urine osmolality is much higher than serum.  Neurology on board briefly placed on 3% normal saline, on fluid  restriction and IV Lasix, sodium gradually improving.  Once her sodium is crossed 130 we can try higher dose Lasix as I still think she has a large element of CHF as well.     2.  Acute on chronic diastolic CHF last EF 93%.  Echo noted ++ d CHF, BNP high, has orthopnea, SOB on exertion and crackles on exam, chest x-ray also shows fluid overload, continue diuresis as above.  If blood pressure allows will try to continue low-dose beta-blocker.  CT noted with chronic interstitial changes will require outpatient pulmonary follow-up postdischarge.   3.  Recent fall at home with C7 endplate and C6 vertebral body fracture.  She has seen neurosurgery in the outpatient setting currently had a c-collar which she has to wear at all times till she sees neurosurgery and they take it off.  She is finishing her prednisone taper in the next few days.   4.  Hypertension.  On Coreg will add hydralazine for better control and monitor we will avoid ACE and ARB till fluid status is stabilized.   5.  Osteoarthritis.  Supportive care.   6.  Dyslipidemia.  On statin.   7.  Depression.  Will hold Lexapro until sodium levels stabilize.      Condition - Fair  Family Communication  :  daughter bedside 07/12/21, 07/14/2021  Code Status :  Full  Consults  :  Renal  PUD Prophylaxis : PPI   Procedures  :     TTE - 1. Prominent basal septal hypertrophy and hypertrophied papilary muscles no mid cavitary gradient recoreded No SAM . Left ventricular ejection fraction, by estimation, is 60 to 65%. The left ventricle has normal function. The left ventricle has no regional wall motion abnormalities. There is severe asymmetric left ventricular hypertrophy of the basal and septal segments. Left ventricular diastolic parameters are consistent with Grade II diastolic dysfunction (pseudonormalization). Elevated left ventricular end-diastolic pressure.  2. Right ventricular systolic function is normal. The right ventricular size is  normal.  3. The pericardial effusion is posterior to the left ventricle.  4. The mitral valve is abnormal. Trivial mitral valve regurgitation. No evidence of mitral stenosis.  5. Small gradient across AV but morphologically opens well . The aortic valve is tricuspid. There is mild calcification of the aortic valve. There is mild thickening of the aortic valve. Aortic valve regurgitation is mild. Aortic valve sclerosis is present, with no evidence of aortic valve stenosis.  6. The inferior vena cava is normal in size with greater than 50% respiratory variability, suggesting right atrial pressure of 3 mmHg.      Disposition Plan  :    Status is: Inpatient  DVT Prophylaxis  :    Place TED hose Start: 07/13/21 1028 heparin injection 5,000 Units Start: 07/12/21 1400  Lab Results  Component Value Date   PLT 249 07/16/2021    Diet :  Diet Order             Diet regular Room service appropriate? Yes with Assist; Fluid consistency: Thin; Fluid restriction: Other (see comments)  Diet effective now                    Inpatient Medications  Scheduled Meds:  carvedilol  3.125 mg Oral BID WC   diltiazem  180 mg Oral q morning   furosemide  20 mg Intravenous BID   heparin  5,000 Units Subcutaneous Q8H   hydrALAZINE  50 mg Oral Q8H   hydrocerin   Topical Daily   levothyroxine  125 mcg Oral Once per day on Mon Tue Wed Thu Fri   pantoprazole  40 mg Oral Daily   predniSONE  5 mg Oral Q breakfast   rosuvastatin  20 mg Oral Daily   urea  15 g Oral BID   Continuous Infusions:   PRN Meds:.acetaminophen **OR** acetaminophen, bisacodyl, dextromethorphan-guaiFENesin, melatonin, ondansetron **OR** ondansetron (ZOFRAN) IV, traMADol  Antibiotics  :    Anti-infectives (From admission, onward)    None        Time Spent in minutes  30   Phillips Climes M.D on 07/16/2021 at 1:58 PM  To page go to [email or web address]   [facility] -  Office  [phone]  See all Orders from today  for further details    Objective:   Vitals:   07/16/21 0000 07/16/21 0400 07/16/21 0752 07/16/21 1139  BP: (!) 166/75 (!) 140/122 (!) 160/74 115/70  Pulse: 67 69 61 80  Resp: '18 19 16 16  '$ Temp: (!) 97.5 F (36.4 C)  97.6 F (36.4 C) 98.3 F (36.8 C)  TempSrc: Oral  Oral Oral  SpO2: 97% 95% 95% 94%  Weight:      Height:        Wt Readings from Last 3 Encounters:  07/13/21 60.2 kg  07/03/21 56.7 kg  07/28/19 58.5 kg     Intake/Output Summary (Last 24 hours) at 07/16/2021 1358 Last data filed at 07/16/2021 1139 Gross per 24 hour  Intake 120 ml  Output 2600 ml  Net -2480 ml     Physical Exam  Awake Alert, No new F.N deficits, Normal affect [place].AT,PERRAL Wearing a c-collar Symmetrical Chest wall movement, Good air movement bilaterally, coarse bibasilar crackles RRR,No Gallops, Rubs or new Murmurs,  +ve B.Sounds, Abd Soft, No tenderness,   No Cyanosis, Clubbing or edema      Data Review:    CBC Recent Labs  Lab 07/12/21 0945 07/12/21 1005 07/13/21 0201 07/14/21 0420 07/14/21 2359 07/16/21 0352  WBC 15.5*  --  14.3* 12.2* 11.0* 11.6*  HGB 13.5 13.6 12.6 12.0 11.8* 12.7  HCT 36.5 40.0 33.9* 32.4* 32.6* 34.5*  PLT 268  --  254 240 235 249  MCV 89.5  --  89.4 89.5 91.8 90.8  MCH 33.1  --  33.2 33.1 33.2 33.4  MCHC 37.0*  --  37.2* 37.0* 36.2* 36.8*  RDW 14.9  --  15.0 15.2 15.5 15.7*  LYMPHSABS 1.4  --  1.2 0.6* 0.5* 0.7  MONOABS 1.0  --  0.6 0.5 0.5 0.6  EOSABS 0.1  --  0.1 0.0 0.1 0.1  BASOSABS 0.0  --  0.0 0.0 0.0 0.1    Recent Labs  Lab 07/12/21 0945 07/12/21 1005 07/12/21 1250 07/12/21 1525 07/13/21 0201 07/13/21 1440 07/14/21 0042 07/14/21 0420 07/14/21 0735 07/14/21 2359 07/15/21 0751 07/15/21 1943 07/16/21 0026 07/16/21 0352 07/16/21 0742 07/16/21 1159  NA 114* 114*  --    < > 116*   116*   < >  --  120*   < > 123*   < > 120* 124* 126* 124* 126*  K 3.8 3.7  --   --  3.2*  --   --  4.1  --  3.7  --   --   --  3.9  --   --   CL 82*  80*  --   --  83*  --   --  89*  --  88*  --   --   --  89*  --   --   CO2 24  --   --   --  27  --   --  25  --  27  --   --   --  27  --   --   GLUCOSE 86 88  --   --  91  --   --  100*  --  114*  --   --   --  99  --   --   BUN 16 19  --   --  12  --   --  12  --  45*  --   --   --  40*  --   --   CREATININE 0.46 0.40*  --   --  0.40*  --   --  0.48  --  0.59  --   --   --  0.56  --   --   CALCIUM 7.9*  --   --   --  7.7*  --   --  7.6*  --  8.2*  --   --   --  8.9  --   --   AST 27  --   --   --  19  --   --  17  --  24  --   --   --  26  --   --   ALT 25  --   --   --  25  --   --  25  --  29  --   --   --  32  --   --   ALKPHOS 73  --   --   --  79  --   --  86  --  92  --   --   --  93  --   --   BILITOT 1.3*  --   --   --  1.0  --   --  0.5  --  0.5  --   --   --  1.1  --   --   ALBUMIN 3.3*  --   --   --  2.9*  --   --  2.8*  --  2.8*  --   --   --  2.9*  --   --   MG 1.8  --   --   --  1.7  --   --  2.2  --  2.0  --   --   --  2.0  --   --   CRP  --   --  <0.5  --  0.5  --   --  1.8*  --  5.3*  --   --   --  6.3*  --   --   PROCALCITON  --   --   --   --  <0.10   <0.10  --   --  <0.10  --   --   --   --   --   --   --   --   TSH  --   --   --   --   --   --  0.329*  --   --   --   --   --   --   --   --   --  BNP 204.3*  --   --   --  221.1*  --   --  136.0*  --  112.0*  --   --   --  83.6  --   --    < > = values in this interval not displayed.    ------------------------------------------------------------------------------------------------------------------ No results for input(s): CHOL, HDL, LDLCALC, TRIG, CHOLHDL, LDLDIRECT in the last 72 hours.  No results found for: HGBA1C ------------------------------------------------------------------------------------------------------------------ Recent Labs    07/14/21 0042  TSH 0.329*    Cardiac Enzymes No results for input(s): CKMB, TROPONINI, MYOGLOBIN in the last 168 hours.  Invalid input(s):  CK ------------------------------------------------------------------------------------------------------------------    Component Value Date/Time   BNP 83.6 07/16/2021 0352   Radiology Reports DG Chest 2 View  Result Date: 07/12/2021 CLINICAL DATA:  Pt brought from home for c/o intermittent AMS, increased general weakness and decreased mobility. Pt has non productive cough. Pt denies any CP but states she has been feeling SOB x a few days Nonsmoker Hx of HTN, cancer, GERD, EXAM: CHEST - 2 VIEW COMPARISON:  04/19/2014 FINDINGS: Progressive development of coarse peripheral interstitial opacities, worse left than right, involving bases more than apices. Heart size upper limits normal.  Tortuous ectatic thoracic aorta. No effusion. Levoscoliosis in the upper thoracic spine with suspected vertebral segmentation anomaly, incompletely characterized. IMPRESSION: 1. Coarse asymmetric interstitial opacities left greater than right, suggesting progressive chronic interstitial lung disease. Cannot exclude superimposed interstitial infiltrate or asymmetric edema. Electronically Signed   By: Lucrezia Europe M.D.   On: 07/12/2021 10:10   DG Lumbar Spine Complete  Result Date: 07/03/2021 CLINICAL DATA:  Status post fall with lower back pain. EXAM: LUMBAR SPINE - COMPLETE 4+ VIEW COMPARISON:  February 25, 2021 FINDINGS: There is no evidence of acute lumbar spine fracture. Mild dextroscoliosis is seen with stable grade 1 anterolisthesis of the L4 vertebral body on L5. Mild to moderate severity multilevel endplate sclerosis is seen. Stable marked severity intervertebral disc space narrowing is noted at the level of L4-L5. IMPRESSION: 1. No evidence of acute lumbar spine fracture. 2. Stable marked severity degenerative disc disease at L4-L5 with stable grade 1 anterolisthesis of the L4 vertebral body. Electronically Signed   By: Virgina Norfolk M.D.   On: 07/03/2021 02:47   CT Head Wo Contrast  Result Date:  07/04/2021 CLINICAL DATA:  Trauma EXAM: CT HEAD WITHOUT CONTRAST TECHNIQUE: Contiguous axial images were obtained from the base of the skull through the vertex without intravenous contrast. RADIATION DOSE REDUCTION: This exam was performed according to the departmental dose-optimization program which includes automated exposure control, adjustment of the mA and/or kV according to patient size and/or use of iterative reconstruction technique. COMPARISON:  07/03/2021 FINDINGS: Brain: No acute intracranial findings are seen. There are no signs of intracranial bleeding. There are no epidural or subdural fluid collections. Cortical sulci are prominent. There is decreased density in the periventricular white matter. Vascular: Unremarkable. Skull: No fracture is seen in the calvarium. There is subcutaneous contusion/hematoma in the posterior parietal scalp. Sinuses/Orbits: There are no air-fluid levels. Other: None IMPRESSION: No acute intracranial findings are seen in noncontrast CT brain. There is subcutaneous contusion/hematoma in the parietal scalp. No fracture is seen in the calvarium. Electronically Signed   By: Elmer Picker M.D.   On: 07/04/2021 17:47   CT Head Wo Contrast  Result Date: 07/03/2021 CLINICAL DATA:  Recent slip and fall with headaches and neck pain, initial encounter EXAM: CT HEAD WITHOUT CONTRAST CT CERVICAL SPINE WITHOUT CONTRAST TECHNIQUE:  Multidetector CT imaging of the head and cervical spine was performed following the standard protocol without intravenous contrast. Multiplanar CT image reconstructions of the cervical spine were also generated. RADIATION DOSE REDUCTION: This exam was performed according to the departmental dose-optimization program which includes automated exposure control, adjustment of the mA and/or kV according to patient size and/or use of iterative reconstruction technique. COMPARISON:  04/18/2019 FINDINGS: CT HEAD FINDINGS Brain: No evidence of acute infarction,  hemorrhage, hydrocephalus, extra-axial collection or mass lesion/mass effect. Chronic atrophic and ischemic changes are noted. Vascular: No hyperdense vessel or unexpected calcification. Skull: Normal. Negative for fracture or focal lesion. Sinuses/Orbits: No acute finding. Other: None. CT CERVICAL SPINE FINDINGS Alignment: Loss of the normal cervical lordosis is noted. Anterolisthesis of C4 on C5 is seen felt to be degenerative in nature. Skull base and vertebrae: 7 cervical segments are well visualized. Vertebral body height is well maintained. At least partial fusion at C5-6 is noted. Anterolisthesis of C4 on C5 is noted of a degenerative nature. No acute fracture or acute facet abnormality is noted. Multilevel facet hypertrophic changes are seen. Significant scoliosis in the upper thoracic spine is noted concave to the left. There are findings suspicious for hemi vertebra between C6 and T1 vertebra on the left. Soft tissues and spinal canal: Surrounding soft tissue structures show vascular calcifications. Upper chest: Visualized lung apices are within normal limits. Other: None IMPRESSION: CT of the head: Chronic atrophic and ischemic changes. CT of the cervical spine: Multilevel degenerative change. No acute abnormality noted. 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 Electronically Signed   By: Inez Catalina M.D.   On: 07/03/2021 04:02   CT CHEST WO CONTRAST  Result Date: 07/14/2021 CLINICAL DATA:  Cough for greater than 8 weeks. Progressive weakness. EXAM: CT CHEST WITHOUT CONTRAST TECHNIQUE: Multidetector CT imaging of the chest was performed following the standard protocol without IV contrast. RADIATION DOSE REDUCTION: This exam was performed according to the departmental dose-optimization program which includes automated exposure control, adjustment of the mA and/or kV according to patient size and/or use of iterative reconstruction technique. COMPARISON:  None. FINDINGS: Cardiovascular: Normal heart  size. No pericardial effusion. Aortic atherosclerosis and coronary artery calcifications. Mediastinum/Nodes: No enlarged mediastinal or axillary lymph nodes. Thyroid gland, trachea, and esophagus demonstrate no significant findings. Lungs/Pleura: No pleural effusion, airspace consolidation, atelectasis, or pneumothorax. No interstitial edema. Signs of chronic interstitial lung disease identified with peripheral predominant subpleural reticulation, traction bronchiectasis and multifocal patchy areas of ground-glass attenuation. Thickening of the peribronchovascular interstitium is also identified. No frank honeycombing noted. Calcified granuloma is noted within the left upper lobe. Upper Abdomen: No acute findings within the imaged portions of the upper abdomen. Subcapsular cyst within medial segment of left hepatic lobe measures 9 mm. Aortic atherosclerotic calcifications. Musculoskeletal: Thoracolumbar scoliosis deformity with mild degenerative disc disease. No acute or suspicious osseous findings. IMPRESSION: 1. No acute cardiopulmonary abnormality. 2. Signs of chronic interstitial lung disease including peripheral subpleural reticulation, traction bronchiectasis and multifocal areas of ground-glass attenuation. Recommend follow-up imaging with high-resolution CT of the chest in 6 months to assess for temporal change in the appearance of the lungs. 3. Thoracolumbar scoliosis and degenerative disc disease. 4. Aortic Atherosclerosis (ICD10-I70.0). Electronically Signed   By: Kerby Moors M.D.   On: 07/14/2021 14:38   CT Cervical Spine Wo Contrast  Addendum Date: 07/04/2021   ADDENDUM REPORT: 07/04/2021 17:44 ADDENDUM: Imaging findings were relayed to patient's provider Theodis Blaze by telephone call. Electronically Signed   By:  Elmer Picker M.D.   On: 07/04/2021 17:44   Result Date: 07/04/2021 CLINICAL DATA:  Trauma EXAM: CT CERVICAL SPINE WITHOUT CONTRAST TECHNIQUE: Multidetector CT imaging of the  cervical spine was performed without intravenous contrast. Multiplanar CT image reconstructions were also generated. RADIATION DOSE REDUCTION: This exam was performed according to the departmental dose-optimization program which includes automated exposure control, adjustment of the mA and/or kV according to patient size and/or use of iterative reconstruction technique. COMPARISON:  07/03/2021 FINDINGS: Alignment: There is minimal anterolisthesis at C4-C5 level suggesting previous ligament injury and facet degeneration. There is congenital partial fusion of bodies of C5 and C6 vertebrae. There is a radiolucent line in the right lamina of C6 vertebra. There is radiolucent line in the left anterior superior C7 vertebra. There is congenital partial fusion of bodies of T1 and T2 vertebrae. 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 Skull base and vertebrae: Degenerative changes are noted with bony spurs and facet hypertrophy at multiple levels in the cervical spine. Soft tissues and spinal canal: There is no significant central spinal stenosis. Disc levels: There is encroachment of neural foramina from C2-C7 levels. Upper chest: Unremarkable. Other: Thyroid is not seen suggesting atrophy or previous removal. IMPRESSION: There is radiolucent line in the right lamina of C6 vertebra. There is radiolucent line in the anterior superior left side body C7 vertebra. Possibility of recent fractures is not excluded. Follow-up MRI as clinically warranted should be considered. Cervical spondylosis with encroachment of neural foramina at multiple levels. Congenital deformity is seen in the lower cervical and upper thoracic spine. Other findings as described in the body of the report. Electronically Signed: By: Elmer Picker M.D. On: 07/04/2021 17:40   CT Cervical Spine Wo Contrast  Result Date: 07/03/2021 CLINICAL DATA:  Recent slip and fall with headaches and neck pain, initial encounter  EXAM: CT HEAD WITHOUT CONTRAST CT CERVICAL SPINE WITHOUT CONTRAST TECHNIQUE: Multidetector CT imaging of the head and cervical spine was performed following the standard protocol without intravenous contrast. Multiplanar CT image reconstructions of the cervical spine were also generated. RADIATION DOSE REDUCTION: This exam was performed according to the departmental dose-optimization program which includes automated exposure control, adjustment of the mA and/or kV according to patient size and/or use of iterative reconstruction technique. COMPARISON:  04/18/2019 FINDINGS: CT HEAD FINDINGS Brain: No evidence of acute infarction, hemorrhage, hydrocephalus, extra-axial collection or mass lesion/mass effect. Chronic atrophic and ischemic changes are noted. Vascular: No hyperdense vessel or unexpected calcification. Skull: Normal. Negative for fracture or focal lesion. Sinuses/Orbits: No acute finding. Other: None. CT CERVICAL SPINE FINDINGS Alignment: Loss of the normal cervical lordosis is noted. Anterolisthesis of C4 on C5 is seen felt to be degenerative in nature. Skull base and vertebrae: 7 cervical segments are well visualized. Vertebral body height is well maintained. At least partial fusion at C5-6 is noted. Anterolisthesis of C4 on C5 is noted of a degenerative nature. No acute fracture or acute facet abnormality is noted. Multilevel facet hypertrophic changes are seen. Significant scoliosis in the upper thoracic spine is noted concave to the left. There are findings suspicious for hemi vertebra between C6 and T1 vertebra on the left. Soft tissues and spinal canal: Surrounding soft tissue structures show vascular calcifications. Upper chest: Visualized lung apices are within normal limits. Other: None IMPRESSION: CT of the head: Chronic atrophic and ischemic changes. CT of the cervical spine: Multilevel degenerative change. No acute abnormality noted. Congenital hemi vertebra at C7  on the left. Electronically  Signed   By: Inez Catalina M.D.   On: 07/03/2021 04:02   MR CERVICAL SPINE WO CONTRAST  Result Date: 07/10/2021 CLINICAL DATA:  Cervicalgia EXAM: MRI CERVICAL SPINE WITHOUT CONTRAST TECHNIQUE: Multiplanar, multisequence MR imaging of the cervical spine was performed. No intravenous contrast was administered. COMPARISON:  Cervical spine CT 07/04/2021 FINDINGS: Alignment: Segmentation anomalies at the cervicothoracic junction contribute to severe levoscoliosis of the upper thoracic spine. Grade 1 anterolisthesis at C3-4 and C4-5. Vertebrae: There is edema at the superior C7 endplate in the region of the fracture identified on the earlier CT. There is also edema at the site of the C6 spinous process fracture. Assessment of the bones is otherwise limited by the degree of motion. Cord: Assessment severely limited by motion. Posterior Fossa, vertebral arteries, paraspinal tissues: Negative. Disc levels: C1-2: Unremarkable. C2-3: No disc herniation. There is no spinal canal stenosis. No neural foraminal stenosis. C3-4: Facet arthrosis, left-greater-than-right, and left foraminal disc protrusion. There is no spinal canal stenosis. Severe left neural foraminal stenosis. C4-5: Severe left facet arthrosis. There is no spinal canal stenosis. Severe left neural foraminal stenosis. C5-6: No disc herniation, spinal canal stenosis or neural impingement C6-7: No disc herniation, spinal canal stenosis or neural impingement . C7-T1: No disc herniation, spinal canal stenosis or neural impingement IMPRESSION: 1. Severely motion degraded study. 2. Edema at the site of the superior C7 endplate fracture and C6 spinous process fracture. 3. Severe left C3-4 and C4-5 neural foraminal stenosis secondary to combination of facet arthrosis and left foraminal disc protrusion. 4. Cervicothoracic segmentation anomalies with marked scoliosis. Electronically Signed   By: Ulyses Jarred M.D.   On: 07/10/2021 21:34   DG Chest Port 1 View  Result  Date: 07/13/2021 CLINICAL DATA:  88 year old female with history of shortness of breath. EXAM: PORTABLE CHEST 1 VIEW COMPARISON:  Chest x-ray 07/12/2021. FINDINGS: Lung volumes are low. No consolidative airspace disease. No pleural effusions. Widespread interstitial prominence, most evident throughout the mid to lower lungs bilaterally, similar to the prior examination no pneumothorax. No evidence of pulmonary edema. Heart size is normal. Numerous calcified mediastinal and bilateral hilar lymph nodes are incidentally noted. Upper mediastinal contours are distorted by patient positioning. Atherosclerosis in the thoracic aorta. IMPRESSION: 1. Low lung volumes with diffuse interstitial prominence, most evident throughout the mid to lower lungs bilaterally, concerning for interstitial lung disease. Further evaluation with nonemergent high-resolution chest CT is recommended in the near future to better evaluate these findings. 2. Aortic atherosclerosis. Electronically Signed   By: Vinnie Langton M.D.   On: 07/13/2021 06:36   ECHOCARDIOGRAM COMPLETE  Result Date: 07/12/2021    ECHOCARDIOGRAM REPORT   Patient Name:   KADEN DUNKEL Date of Exam: 07/12/2021 Medical Rec #:  [id number]           Height:       61.0 in Accession #:    [id number]          Weight:       125.0 lb Date of Birth:  [birth year]          BSA:          1.547 m Patient Age:    43 years            BP:           136/84 mmHg Patient Gender: F                   HR:  71 bpm. Exam Location:  Inpatient Procedure: 2D Echo, Cardiac Doppler and Color Doppler Indications:    CHF  History:        Patient has prior history of Echocardiogram examinations, most                 recent 07/28/2019. CHF; Risk Factors:Hypertension and                 Dyslipidemia.  Sonographer:    Luisa Hart RDCS Referring Phys: [id number] [place]  1. Prominent basal septal hypertrophy and hypertrophied papilary muscles no mid cavitary gradient recoreded No  SAM . Left ventricular ejection fraction, by estimation, is 60 to 65%. The left ventricle has normal function. The left ventricle has no regional wall motion abnormalities. There is severe asymmetric left ventricular hypertrophy of the basal and septal segments. Left ventricular diastolic parameters are consistent with Grade II diastolic dysfunction (pseudonormalization). Elevated left ventricular end-diastolic pressure.  2. Right ventricular systolic function is normal. The right ventricular size is normal.  3. The pericardial effusion is posterior to the left ventricle.  4. The mitral valve is abnormal. Trivial mitral valve regurgitation. No evidence of mitral stenosis.  5. Small gradient across AV but morphologically opens well . The aortic valve is tricuspid. There is mild calcification of the aortic valve. There is mild thickening of the aortic valve. Aortic valve regurgitation is mild. Aortic valve sclerosis is present, with no evidence of aortic valve stenosis.  6. The inferior vena cava is normal in size with greater than 50% respiratory variability, suggesting right atrial pressure of 3 mmHg. FINDINGS  Left Ventricle: Prominent basal septal hypertrophy and hypertrophied papilary muscles no mid cavitary gradient recoreded No SAM. Left ventricular ejection fraction, by estimation, is 60 to 65%. The left ventricle has normal function. The left ventricle has no regional wall motion abnormalities. The left ventricular internal cavity size was normal in size. There is severe asymmetric left ventricular hypertrophy of the basal and septal segments. Left ventricular diastolic parameters are consistent with Grade II diastolic dysfunction (pseudonormalization). Elevated left ventricular end-diastolic pressure. Right Ventricle: The right ventricular size is normal. No increase in right ventricular wall thickness. Right ventricular systolic function is normal. Left Atrium: Left atrial size was normal in size. Right  Atrium: Right atrial size was normal in size. Pericardium: Trivial pericardial effusion is present. The pericardial effusion is posterior to the left ventricle. Mitral Valve: The mitral valve is abnormal. There is mild thickening of the mitral valve leaflet(s). There is moderate calcification of the mitral valve leaflet(s). Mild mitral annular calcification. Trivial mitral valve regurgitation. No evidence of mitral valve stenosis. MV peak gradient, 4.4 mmHg. The mean mitral valve gradient is 2.0 mmHg. Tricuspid Valve: The tricuspid valve is normal in structure. Tricuspid valve regurgitation is trivial. No evidence of tricuspid stenosis. Aortic Valve: Small gradient across AV but morphologically opens well. The aortic valve is tricuspid. There is mild calcification of the aortic valve. There is mild thickening of the aortic valve. Aortic valve regurgitation is mild. Aortic regurgitation PHT measures 565 msec. Aortic valve sclerosis is present, with no evidence of aortic valve stenosis. Aortic valve mean gradient measures 7.0 mmHg. Aortic valve peak gradient measures 13.3 mmHg. Aortic valve area, by VTI measures 1.78 cm. Pulmonic Valve: The pulmonic valve was normal in structure. Pulmonic valve regurgitation is not visualized. No evidence of pulmonic stenosis. Aorta: The aortic root is normal in size and structure. Venous: The inferior vena cava is normal  in size with greater than 50% respiratory variability, suggesting right atrial pressure of 3 mmHg. IAS/Shunts: No atrial level shunt detected by color flow Doppler.  LEFT VENTRICLE PLAX 2D LVIDd:         3.00 cm     Diastology LVIDs:         1.40 cm     LV e' medial:    4.43 cm/s LV PW:         1.00 cm     LV E/e' medial:  17.2 LV IVS:        1.80 cm     LV e' lateral:   6.30 cm/s LVOT diam:     1.80 cm     LV E/e' lateral: 12.1 LV SV:         57 LV SV Index:   37 LVOT Area:     2.54 cm  LV Volumes (MOD) LV vol d, MOD A2C: 29.0 ml LV vol d, MOD A4C: 22.0 ml LV vol  s, MOD A2C: 7.9 ml LV vol s, MOD A4C: 4.8 ml LV SV MOD A2C:     21.1 ml LV SV MOD A4C:     22.0 ml LV SV MOD BP:      20.8 ml RIGHT VENTRICLE RV Basal diam:  2.60 cm RV Mid diam:    2.60 cm RV S prime:     13.40 cm/s TAPSE (M-mode): 1.6 cm LEFT ATRIUM             Index        RIGHT ATRIUM          Index LA diam:        3.20 cm 2.07 cm/m   RA Area:     8.13 cm LA Vol (A2C):   40.2 ml 25.99 ml/m  RA Volume:   12.60 ml 8.15 ml/m LA Vol (A4C):   39.1 ml 25.28 ml/m LA Biplane Vol: 41.9 ml 27.09 ml/m  AORTIC VALVE                     PULMONIC VALVE AV Area (Vmax):    1.76 cm      PV Vmax:       1.00 m/s AV Area (Vmean):   1.76 cm      PV Vmean:      65.550 cm/s AV Area (VTI):     1.78 cm      PV VTI:        0.196 m AV Vmax:           182.50 cm/s   PV Peak grad:  4.0 mmHg AV Vmean:          120.000 cm/s  PV Mean grad:  2.5 mmHg AV VTI:            0.322 m AV Peak Grad:      13.3 mmHg AV Mean Grad:      7.0 mmHg LVOT Vmax:         126.00 cm/s LVOT Vmean:        83.200 cm/s LVOT VTI:          0.225 m LVOT/AV VTI ratio: 0.70 AI PHT:            565 msec  AORTA Ao Root diam: 2.60 cm Ao Asc diam:  3.20 cm MITRAL VALVE               TRICUSPID VALVE MV Area (PHT): 3.42 cm  TR Peak grad:   33.9 mmHg MV Area VTI:   1.90 cm    TR Vmax:        291.00 cm/s MV Peak grad:  4.4 mmHg MV Mean grad:  2.0 mmHg    SHUNTS MV Vmax:       1.05 m/s    Systemic VTI:  0.22 m MV Vmean:      60.5 cm/s   Systemic Diam: 1.80 cm MV Decel Time: 222 msec MV E velocity: 76.20 cm/s MV A velocity: 82.50 cm/s MV E/A ratio:  0.92 Jenkins Rouge MD Electronically signed by Jenkins Rouge MD Signature Date/Time: 07/12/2021/2:40:27 PM    Final

## 2021-07-16 NOTE — [place] FL2 (Signed)
?[place]  MEDICAID FL2 LEVEL OF CARE SCREENING TOOL  ?  ? ?IDENTIFICATION  ?Patient Name: ?Lorraine Brandt Birthdate: Jan 28, 1934 Sex: female Admission Date (Current Location): ?07/12/2021  ?[place] and [place] Number: ? [place] ?  Facility and Address:  ?[facility], [place] [address] [place], [place] [address] ?     Provider Number: ?[id number]  ?Attending Physician Name and Address:  ?Elgergawy, Silver Huguenin, MD ? Relative Name and Phone Number:  ?  ?   ?Current Level of Care: ?Hospital Recommended Level of Care: ?[place] Prior Approval Number: ?  ? ?Date Approved/Denied: ?  PASRR Number: ?[id number] A ? ?Discharge Plan: ?SNF ?  ? ?Current Diagnoses: ?Patient Active Problem List  ? Diagnosis Date Noted  ? Hyponatremia 07/12/2021  ? Closed C6 fracture ([place]) 07/12/2021  ? Fall at home, initial encounter 07/12/2021  ? Acute on chronic diastolic CHF (congestive heart failure) ([place]) 07/12/2021  ? Retinal artery occlusion   ? Central retinal artery occlusion 07/07/2019  ? Essential hypertension 07/07/2019  ? Pure hypercholesterolemia 07/07/2019  ? Postoperative state 11/02/2014  ? OA (osteoarthritis) of knee 04/25/2014  ? ? ?Orientation RESPIRATION BLADDER Height & Weight   ?  ?Self, Time, Situation, Place ? Normal Incontinent, External catheter Weight: 132 lb 11.5 oz (60.2 kg) ?Height:  '5\' 1"'$  (154.9 cm)  ?BEHAVIORAL SYMPTOMS/MOOD NEUROLOGICAL BOWEL NUTRITION STATUS  ?    Incontinent Diet (see dc summary)  ?AMBULATORY STATUS COMMUNICATION OF NEEDS Skin   ?Limited Assist Verbally Normal ?  ?  ?  ?    ?     ?     ? ? ?Personal Care Assistance Level of Assistance  ?Bathing, Feeding, Dressing Bathing Assistance: Maximum assistance ?Feeding assistance: Independent ?Dressing Assistance: Limited assistance ?   ? ?Functional Limitations Info  ?Sight, Hearing Sight Info: Impaired ?Hearing Info: Impaired ?   ? ? ?SPECIAL CARE FACTORS FREQUENCY  ?PT (By licensed PT), OT (By licensed OT)   ?   ?PT Frequency: 5x/week ?OT Frequency: 5x/week ?  ?  ?  ?   ? ? ?Contractures Contractures Info: Not present  ? ? ?Additional Factors Info  ?Code Status, Allergies Code Status Info: Full ?Allergies Info: Oxycodone, Celebrex (Celecoxib), Latex ?  ?  ?  ?   ? ?Current Medications (07/16/2021):  This is the current hospital active medication list ?Current Facility-Administered Medications  ?Medication Dose Route Frequency Provider Last Rate Last Admin  ? acetaminophen (TYLENOL) tablet 650 mg  650 mg Oral Q6H PRN Thurnell Lose, MD   650 mg at 07/16/21 0423  ? Or  ? acetaminophen (TYLENOL) suppository 650 mg  650 mg Rectal Q6H PRN Thurnell Lose, MD      ? bisacodyl (DULCOLAX) EC tablet 5 mg  5 mg Oral Daily PRN Thurnell Lose, MD   5 mg at 07/14/21 0901  ? carvedilol (COREG) tablet 3.125 mg  3.125 mg Oral BID WC Thurnell Lose, MD   3.125 mg at 07/16/21 0856  ? dextromethorphan-guaiFENesin (MUCINEX DM) 30-600 MG per 12 hr tablet 1 tablet  1 tablet Oral BID PRN Opyd, Ilene Qua, MD   1 tablet at 07/16/21 0117  ? diltiazem (CARDIZEM CD) 24 hr capsule 180 mg  180 mg Oral q morning Thurnell Lose, MD   180 mg at 07/16/21 0859  ? furosemide (LASIX) injection 20 mg  20 mg Intravenous BID Pearson Grippe B, MD   20 mg at 07/16/21 0856  ? heparin injection  5,000 Units  5,000 Units Subcutaneous Q8H Thurnell Lose, MD   5,000 Units at 07/16/21 0518  ? hydrALAZINE (APRESOLINE) tablet 50 mg  50 mg Oral Q8H Thurnell Lose, MD   50 mg at 07/16/21 0973  ? hydrocerin (EUCERIN) cream   Topical Daily Thurnell Lose, MD   Given at 07/16/21 [phone]  ? levothyroxine (SYNTHROID) tablet 125 mcg  125 mcg Oral Once per day on Mon Tue Wed Thu Fri Thurnell Lose, MD   125 mcg at 07/16/21 9242  ? melatonin tablet 10 mg  10 mg Oral QHS PRN Kristopher Oppenheim, DO   10 mg at 07/15/21 2119  ? ondansetron (ZOFRAN) tablet 4 mg  4 mg Oral Q6H PRN Thurnell Lose, MD      ? Or  ? ondansetron (ZOFRAN) injection 4 mg  4 mg Intravenous Q6H PRN  Thurnell Lose, MD      ? pantoprazole (PROTONIX) EC tablet 40 mg  40 mg Oral Daily Thurnell Lose, MD   40 mg at 07/16/21 0856  ? predniSONE (DELTASONE) tablet 5 mg  5 mg Oral Q breakfast Thurnell Lose, MD   5 mg at 07/15/21 0851  ? rosuvastatin (CRESTOR) tablet 20 mg  20 mg Oral Daily Thurnell Lose, MD   20 mg at 07/16/21 0855  ? traMADol (ULTRAM) tablet 50 mg  50 mg Oral Q12H PRN Thurnell Lose, MD   50 mg at 07/14/21 1950  ? urea (URE-NA) oral packet 15 g  15 g Oral BID Pearson Grippe B, MD   15 g at 07/16/21 6834  ? ? ? ?Discharge Medications: ?Please see discharge summary for a list of discharge medications. ? ?Relevant Imaging Results: ? ?Relevant Lab Results: ? ? ?Additional Information ?SSN: [id number]. Received 5 moderna vaccines. ? ?Benard Halsted, LCSW ? ? ? ? ?

## 2021-07-16 NOTE — Progress Notes (Signed)
PROGRESS NOTE                                                                                                                                                                                                             Patient Demographics:    Lorraine Brandt, is a 88 y.o. female, DOB - Jul 10, 1933, DEY:[id number]  Outpatient Primary MD for the patient is Reynold Bowen, MD    LOS - 4  Admit date - 07/12/2021    Chief Complaint  Patient presents with   Altered Mental Status       Brief Narrative (HPI from H&P)      Lorraine Brandt  is a 88 y.o. female, with history of osteoarthritis, hypothyroidism, hypertension, polymyalgia rheumatica, dyslipidemia who has been gradually getting weaker over the last few days, also has developed problems laying flat as she gets short of breath, she sustained 2 falls over the last 2 weeks and one of the falls she hurt her neck, she was diagnosed with C6-C7 fracture she saw neurosurgery outpatient and was placed in a c-collar.  She was also placed on a steroid taper.  She continues to get weak and presented to the ER today where she was found to have severe hyponatremia.   Subjective:   Patient reports some cough, she was encouraged to his incentive spirometer, discussed with staff she is compliant with the fluid restriction, and her appetite overall is good . -Patient had some confusion and hospital delirium upon reevaluation this afternoon.     Assessment  & Plan :     Euvolemic hyponatremia due to SIADH : -Presents with  generalized weakness, falls, and this is likely due to her hyponatremia . - due to SIADH with urine osmolality of 455, sodium of 77, serum osmolality 243. -Most likely combination of tapering her off steroids,  fall with head trauma, and using Lexapro, with possible low solute intake with normal fluid intake . -She required hypertonic saline, currently on fluid restriction, urea and the Lasix . -Discussed with renal, so far no indication for Samsca given it is improving gradually.     Acute on chronic diastolic CHF last EF 68%.  Echo noted ++ d CHF, BNP high, has orthopnea, SOB on exertion and crackles on exam, chest x-ray also shows fluid overload, continue diuresis as above.  If blood pressure allows will try to continue low-dose beta-blocker.    CT noted with chronic interstitial changes  - will require outpatient pulmonary follow-up postdischarge.   Recent fall at home with C7 endplate and C6 vertebral body fracture.  She has seen neurosurgery in the outpatient setting currently had a c-collar which she has to wear at all times till she sees neurosurgery and they take it off.  She is finishing her prednisone taper in the next few days.   Hypertension.  On Coreg will add hydralazine for better control and monitor we will avoid ACE and ARB till fluid status is stabilized.   Osteoarthritis.  Supportive care.  Hypothyroidism -TSH on the lower side at 0.32, possible sick euthyroid, but I will go ahead and decrease her Synthroid from 125 to 100 mcg.    Dyslipidemia.  On statin.   Depression.   -Continue to hold Lexapro due to hyponatremia, discontinue on discharge.      Condition - Fair  Family Communication  : Discussed with her daughter Dr. Steward Drone by phone  Code Status :  Full  Consults  :  Renal  PUD Prophylaxis : PPI   Procedures  :     TTE - 1. Prominent basal septal hypertrophy and hypertrophied papilary muscles no mid cavitary gradient recoreded No SAM . Left ventricular ejection fraction, by estimation, is 60 to 65%. The left ventricle has normal function. The left ventricle has no regional wall motion abnormalities. There is severe asymmetric left ventricular hypertrophy of the  basal and septal segments. Left ventricular diastolic parameters are consistent with Grade II diastolic dysfunction (pseudonormalization). Elevated left ventricular end-diastolic pressure.  2. Right ventricular systolic function is normal. The right ventricular size is normal.  3. The pericardial effusion is posterior to the left ventricle.  4. The mitral valve is abnormal. Trivial mitral valve regurgitation. No evidence of mitral stenosis.  5. Small gradient across AV but morphologically opens well . The aortic valve is tricuspid. There is mild calcification of the aortic valve. There is mild thickening of the aortic valve. Aortic valve regurgitation is mild. Aortic valve sclerosis is present, with no evidence of aortic valve stenosis.  6. The inferior vena cava is normal in size with greater than 50% respiratory variability, suggesting right atrial pressure of 3 mmHg.      Disposition Plan  :    Status is: Inpatient  DVT Prophylaxis  :    Place TED hose Start: 07/13/21 1028 heparin injection 5,000 Units Start: 07/12/21 1400  Lab Results  Component Value Date   PLT 249 07/16/2021    Diet :  Diet Order             Diet regular Room service appropriate? Yes with Assist; Fluid consistency: Thin; Fluid restriction: Other (see  comments)  Diet effective now                    Inpatient Medications  Scheduled Meds:  carvedilol  3.125 mg Oral BID WC   diltiazem  180 mg Oral q morning   feeding supplement  237 mL Oral BID BM   furosemide  20 mg Intravenous BID   heparin  5,000 Units Subcutaneous Q8H   hydrALAZINE  50 mg Oral Q8H   hydrocerin   Topical Daily   levothyroxine  125 mcg Oral Once per day on Mon Tue Wed Thu Fri   pantoprazole  40 mg Oral Daily   predniSONE  5 mg Oral Q breakfast   [START ON 07/17/2021] predniSONE  5 mg Oral Q48H   rosuvastatin  20 mg Oral Daily   urea  15 g Oral BID   Continuous Infusions:   PRN Meds:.acetaminophen **OR** acetaminophen, bisacodyl,  dextromethorphan-guaiFENesin, melatonin, ondansetron **OR** ondansetron (ZOFRAN) IV, traMADol  Antibiotics  :    Anti-infectives (From admission, onward)    None        Time Spent in minutes  30   Phillips Climes M.D on 07/16/2021 at 2:38 PM  To page go to [email or web address]   [facility] -  Office  [phone]  See all Orders from today for further details    Objective:   Vitals:   07/16/21 0000 07/16/21 0400 07/16/21 0752 07/16/21 1139  BP: (!) 166/75 (!) 140/122 (!) 160/74 115/70  Pulse: 67 69 61 80  Resp: '18 19 16 16  '$ Temp: (!) 97.5 F (36.4 C)  97.6 F (36.4 C) 98.3 F (36.8 C)  TempSrc: Oral  Oral Oral  SpO2: 97% 95% 95% 94%  Weight:      Height:        Wt Readings from Last 3 Encounters:  07/13/21 60.2 kg  07/03/21 56.7 kg  07/28/19 58.5 kg     Intake/Output Summary (Last 24 hours) at 07/16/2021 1438 Last data filed at 07/16/2021 1139 Gross per 24 hour  Intake 120 ml  Output 2600 ml  Net -2480 ml     Physical Exam  Awake Alert, frail, deconditioned, still wearing c-collar with upper airway congestion. Symmetrical Chest wall movement, Good air movement bilaterally upper airway congestion RRR,No Gallops,Rubs or new Murmurs, No Parasternal Heave +ve B.Sounds, Abd Soft, No tenderness, No rebound - guarding or rigidity. No Cyanosis, Clubbing or edema, No new Rash or bruise        Data Review:    CBC Recent Labs  Lab 07/12/21 0945 07/12/21 1005 07/13/21 0201 07/14/21 0420 07/14/21 2359 07/16/21 0352  WBC 15.5*  --  14.3* 12.2* 11.0* 11.6*  HGB 13.5 13.6 12.6 12.0 11.8* 12.7  HCT 36.5 40.0 33.9* 32.4* 32.6* 34.5*  PLT 268  --  254 240 235 249  MCV 89.5  --  89.4 89.5 91.8 90.8  MCH 33.1  --  33.2 33.1 33.2 33.4  MCHC 37.0*  --  37.2* 37.0* 36.2* 36.8*  RDW 14.9  --  15.0 15.2 15.5 15.7*  LYMPHSABS 1.4  --  1.2 0.6* 0.5* 0.7  MONOABS 1.0  --  0.6 0.5 0.5 0.6  EOSABS 0.1  --  0.1 0.0 0.1 0.1  BASOSABS 0.0  --  0.0 0.0 0.0 0.1     Recent Labs  Lab 07/12/21 0945 07/12/21 1005 07/12/21 1250 07/12/21 1525 07/13/21 0201 07/13/21 1440 07/14/21 0042 07/14/21 0420 07/14/21 0981 07/14/21 2359 07/15/21 1914 07/15/21 1943 07/16/21 7829  07/16/21 0352 07/16/21 0742 07/16/21 1159  NA 114* 114*  --    < > 116*   116*   < >  --  120*   < > 123*   < > 120* 124* 126* 124* 126*  K 3.8 3.7  --   --  3.2*  --   --  4.1  --  3.7  --   --   --  3.9  --   --   CL 82* 80*  --   --  83*  --   --  89*  --  88*  --   --   --  89*  --   --   CO2 24  --   --   --  27  --   --  25  --  27  --   --   --  27  --   --   GLUCOSE 86 88  --   --  91  --   --  100*  --  114*  --   --   --  99  --   --   BUN 16 19  --   --  12  --   --  12  --  45*  --   --   --  40*  --   --   CREATININE 0.46 0.40*  --   --  0.40*  --   --  0.48  --  0.59  --   --   --  0.56  --   --   CALCIUM 7.9*  --   --   --  7.7*  --   --  7.6*  --  8.2*  --   --   --  8.9  --   --   AST 27  --   --   --  19  --   --  17  --  24  --   --   --  26  --   --   ALT 25  --   --   --  25  --   --  25  --  29  --   --   --  32  --   --   ALKPHOS 73  --   --   --  79  --   --  86  --  92  --   --   --  93  --   --   BILITOT 1.3*  --   --   --  1.0  --   --  0.5  --  0.5  --   --   --  1.1  --   --   ALBUMIN 3.3*  --   --   --  2.9*  --   --  2.8*  --  2.8*  --   --   --  2.9*  --   --   MG 1.8  --   --   --  1.7  --   --  2.2  --  2.0  --   --   --  2.0  --   --   CRP  --   --  <0.5  --  0.5  --   --  1.8*  --  5.3*  --   --   --  6.3*  --   --   PROCALCITON  --   --   --   --  <  0.10   <0.10  --   --  <0.10  --   --   --   --   --   --   --   --   TSH  --   --   --   --   --   --  0.329*  --   --   --   --   --   --   --   --   --   BNP 204.3*  --   --   --  221.1*  --   --  136.0*  --  112.0*  --   --   --  83.6  --   --    < > = values in this interval not displayed.     ------------------------------------------------------------------------------------------------------------------ No results for input(s): CHOL, HDL, LDLCALC, TRIG, CHOLHDL, LDLDIRECT in the last 72 hours.  No results found for: HGBA1C ------------------------------------------------------------------------------------------------------------------ Recent Labs    07/14/21 0042  TSH 0.329*    Cardiac Enzymes No results for input(s): CKMB, TROPONINI, MYOGLOBIN in the last 168 hours.  Invalid input(s): CK ------------------------------------------------------------------------------------------------------------------    Component Value Date/Time   BNP 83.6 07/16/2021 0352   Radiology Reports DG Chest 2 View  Result Date: 07/12/2021 CLINICAL DATA:  Pt brought from home for c/o intermittent AMS, increased general weakness and decreased mobility. Pt has non productive cough. Pt denies any CP but states she has been feeling SOB x a few days Nonsmoker Hx of HTN, cancer, GERD, EXAM: CHEST - 2 VIEW COMPARISON:  04/19/2014 FINDINGS: Progressive development of coarse peripheral interstitial opacities, worse left than right, involving bases more than apices. Heart size upper limits normal.  Tortuous ectatic thoracic aorta. No effusion. Levoscoliosis in the upper thoracic spine with suspected vertebral segmentation anomaly, incompletely characterized. IMPRESSION: 1. Coarse asymmetric interstitial opacities left greater than right, suggesting progressive chronic interstitial lung disease. Cannot exclude superimposed interstitial infiltrate or asymmetric edema. Electronically Signed   By: Lucrezia Europe M.D.   On: 07/12/2021 10:10   DG Lumbar Spine Complete  Result Date: 07/03/2021 CLINICAL DATA:  Status post fall with lower back pain. EXAM: LUMBAR SPINE - COMPLETE 4+ VIEW COMPARISON:  February 25, 2021 FINDINGS: There is no evidence of acute lumbar spine fracture. Mild dextroscoliosis is seen with  stable grade 1 anterolisthesis of the L4 vertebral body on L5. Mild to moderate severity multilevel endplate sclerosis is seen. Stable marked severity intervertebral disc space narrowing is noted at the level of L4-L5. IMPRESSION: 1. No evidence of acute lumbar spine fracture. 2. Stable marked severity degenerative disc disease at L4-L5 with stable grade 1 anterolisthesis of the L4 vertebral body. Electronically Signed   By: Virgina Norfolk M.D.   On: 07/03/2021 02:47   CT Head Wo Contrast  Result Date: 07/04/2021 CLINICAL DATA:  Trauma EXAM: CT HEAD WITHOUT CONTRAST TECHNIQUE: Contiguous axial images were obtained from the base of the skull through the vertex without intravenous contrast. RADIATION DOSE REDUCTION: This exam was performed according to the departmental dose-optimization program which includes automated exposure control, adjustment of the mA and/or kV according to patient size and/or use of iterative reconstruction technique. COMPARISON:  07/03/2021 FINDINGS: Brain: No acute intracranial findings are seen. There are no signs of intracranial bleeding. There are no epidural or subdural fluid collections. Cortical sulci are prominent. There is decreased density in the periventricular white matter. Vascular: Unremarkable. Skull: No fracture is seen in the calvarium. There is subcutaneous contusion/hematoma in the posterior parietal  scalp. Sinuses/Orbits: There are no air-fluid levels. Other: None IMPRESSION: No acute intracranial findings are seen in noncontrast CT brain. There is subcutaneous contusion/hematoma in the parietal scalp. No fracture is seen in the calvarium. Electronically Signed   By: Elmer Picker M.D.   On: 07/04/2021 17:47   CT Head Wo Contrast  Result Date: 07/03/2021 CLINICAL DATA:  Recent slip and fall with headaches and neck pain, initial encounter EXAM: CT HEAD WITHOUT CONTRAST CT CERVICAL SPINE WITHOUT CONTRAST TECHNIQUE: Multidetector CT imaging of the head and  cervical spine was performed following the standard protocol without intravenous contrast. Multiplanar CT image reconstructions of the cervical spine were also generated. RADIATION DOSE REDUCTION: This exam was performed according to the departmental dose-optimization program which includes automated exposure control, adjustment of the mA and/or kV according to patient size and/or use of iterative reconstruction technique. COMPARISON:  04/18/2019 FINDINGS: CT HEAD FINDINGS Brain: No evidence of acute infarction, hemorrhage, hydrocephalus, extra-axial collection or mass lesion/mass effect. Chronic atrophic and ischemic changes are noted. Vascular: No hyperdense vessel or unexpected calcification. Skull: Normal. Negative for fracture or focal lesion. Sinuses/Orbits: No acute finding. Other: None. CT CERVICAL SPINE FINDINGS Alignment: Loss of the normal cervical lordosis is noted. Anterolisthesis of C4 on C5 is seen felt to be degenerative in nature. Skull base and vertebrae: 7 cervical segments are well visualized. Vertebral body height is well maintained. At least partial fusion at C5-6 is noted. Anterolisthesis of C4 on C5 is noted of a degenerative nature. No acute fracture or acute facet abnormality is noted. Multilevel facet hypertrophic changes are seen. Significant scoliosis in the upper thoracic spine is noted concave to the left. There are findings suspicious for hemi vertebra between C6 and T1 vertebra on the left. Soft tissues and spinal canal: Surrounding soft tissue structures show vascular calcifications. Upper chest: Visualized lung apices are within normal limits. Other: None IMPRESSION: CT of the head: Chronic atrophic and ischemic changes. CT of the cervical spine: Multilevel degenerative change. No acute abnormality noted. 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 Electronically Signed   By: Inez Catalina M.D.   On: 07/03/2021 04:02   CT CHEST WO CONTRAST  Result Date: 07/14/2021 CLINICAL DATA:   Cough for greater than 8 weeks. Progressive weakness. EXAM: CT CHEST WITHOUT CONTRAST TECHNIQUE: Multidetector CT imaging of the chest was performed following the standard protocol without IV contrast. RADIATION DOSE REDUCTION: This exam was performed according to the departmental dose-optimization program which includes automated exposure control, adjustment of the mA and/or kV according to patient size and/or use of iterative reconstruction technique. COMPARISON:  None. FINDINGS: Cardiovascular: Normal heart size. No pericardial effusion. Aortic atherosclerosis and coronary artery calcifications. Mediastinum/Nodes: No enlarged mediastinal or axillary lymph nodes. Thyroid gland, trachea, and esophagus demonstrate no significant findings. Lungs/Pleura: No pleural effusion, airspace consolidation, atelectasis, or pneumothorax. No interstitial edema. Signs of chronic interstitial lung disease identified with peripheral predominant subpleural reticulation, traction bronchiectasis and multifocal patchy areas of ground-glass attenuation. Thickening of the peribronchovascular interstitium is also identified. No frank honeycombing noted. Calcified granuloma is noted within the left upper lobe. Upper Abdomen: No acute findings within the imaged portions of the upper abdomen. Subcapsular cyst within medial segment of left hepatic lobe measures 9 mm. Aortic atherosclerotic calcifications. Musculoskeletal: Thoracolumbar scoliosis deformity with mild degenerative disc disease. No acute or suspicious osseous findings. IMPRESSION: 1. No acute cardiopulmonary abnormality. 2. Signs of chronic interstitial lung disease including peripheral subpleural reticulation, traction bronchiectasis and multifocal areas of ground-glass  attenuation. Recommend follow-up imaging with high-resolution CT of the chest in 6 months to assess for temporal change in the appearance of the lungs. 3. Thoracolumbar scoliosis and degenerative disc disease. 4.  Aortic Atherosclerosis (ICD10-I70.0). Electronically Signed   By: Kerby Moors M.D.   On: 07/14/2021 14:38   CT Cervical Spine Wo Contrast  Addendum Date: 07/04/2021   ADDENDUM REPORT: 07/04/2021 17:44 ADDENDUM: Imaging findings were relayed to patient's provider Theodis Blaze by telephone call. Electronically Signed   By: Elmer Picker M.D.   On: 07/04/2021 17:44   Result Date: 07/04/2021 CLINICAL DATA:  Trauma EXAM: CT CERVICAL SPINE WITHOUT CONTRAST TECHNIQUE: Multidetector CT imaging of the cervical spine was performed without intravenous contrast. Multiplanar CT image reconstructions were also generated. RADIATION DOSE REDUCTION: This exam was performed according to the departmental dose-optimization program which includes automated exposure control, adjustment of the mA and/or kV according to patient size and/or use of iterative reconstruction technique. COMPARISON:  07/03/2021 FINDINGS: Alignment: There is minimal anterolisthesis at C4-C5 level suggesting previous ligament injury and facet degeneration. There is congenital partial fusion of bodies of C5 and C6 vertebrae. There is a radiolucent line in the right lamina of C6 vertebra. There is radiolucent line in the left anterior superior C7 vertebra. There is congenital partial fusion of bodies of T1 and T2 vertebrae. 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 Skull base and vertebrae: Degenerative changes are noted with bony spurs and facet hypertrophy at multiple levels in the cervical spine. Soft tissues and spinal canal: There is no significant central spinal stenosis. Disc levels: There is encroachment of neural foramina from C2-C7 levels. Upper chest: Unremarkable. Other: Thyroid is not seen suggesting atrophy or previous removal. IMPRESSION: There is radiolucent line in the right lamina of C6 vertebra. There is radiolucent line in the anterior superior left side body C7 vertebra. Possibility of recent  fractures is not excluded. Follow-up MRI as clinically warranted should be considered. Cervical spondylosis with encroachment of neural foramina at multiple levels. Congenital deformity is seen in the lower cervical and upper thoracic spine. Other findings as described in the body of the report. Electronically Signed: By: Elmer Picker M.D. On: 07/04/2021 17:40   CT Cervical Spine Wo Contrast  Result Date: 07/03/2021 CLINICAL DATA:  Recent slip and fall with headaches and neck pain, initial encounter EXAM: CT HEAD WITHOUT CONTRAST CT CERVICAL SPINE WITHOUT CONTRAST TECHNIQUE: Multidetector CT imaging of the head and cervical spine was performed following the standard protocol without intravenous contrast. Multiplanar CT image reconstructions of the cervical spine were also generated. RADIATION DOSE REDUCTION: This exam was performed according to the departmental dose-optimization program which includes automated exposure control, adjustment of the mA and/or kV according to patient size and/or use of iterative reconstruction technique. COMPARISON:  04/18/2019 FINDINGS: CT HEAD FINDINGS Brain: No evidence of acute infarction, hemorrhage, hydrocephalus, extra-axial collection or mass lesion/mass effect. Chronic atrophic and ischemic changes are noted. Vascular: No hyperdense vessel or unexpected calcification. Skull: Normal. Negative for fracture or focal lesion. Sinuses/Orbits: No acute finding. Other: None. CT CERVICAL SPINE FINDINGS Alignment: Loss of the normal cervical lordosis is noted. Anterolisthesis of C4 on C5 is seen felt to be degenerative in nature. Skull base and vertebrae: 7 cervical segments are well visualized. Vertebral body height is well maintained. At least partial fusion at C5-6 is noted. Anterolisthesis of C4 on C5 is noted of a degenerative nature. No acute fracture or acute facet abnormality is noted.  Multilevel facet hypertrophic changes are seen. Significant scoliosis in the upper  thoracic spine is noted concave to the left. There are findings suspicious for hemi vertebra between C6 and T1 vertebra on the left. Soft tissues and spinal canal: Surrounding soft tissue structures show vascular calcifications. Upper chest: Visualized lung apices are within normal limits. Other: None IMPRESSION: CT of the head: Chronic atrophic and ischemic changes. CT of the cervical spine: Multilevel degenerative change. No acute abnormality noted. 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 Electronically Signed   By: Inez Catalina M.D.   On: 07/03/2021 04:02   MR CERVICAL SPINE WO CONTRAST  Result Date: 07/10/2021 CLINICAL DATA:  Cervicalgia EXAM: MRI CERVICAL SPINE WITHOUT CONTRAST TECHNIQUE: Multiplanar, multisequence MR imaging of the cervical spine was performed. No intravenous contrast was administered. COMPARISON:  Cervical spine CT 07/04/2021 FINDINGS: Alignment: Segmentation anomalies at the cervicothoracic junction contribute to severe levoscoliosis of the upper thoracic spine. Grade 1 anterolisthesis at C3-4 and C4-5. Vertebrae: There is edema at the superior C7 endplate in the region of the fracture identified on the earlier CT. There is also edema at the site of the C6 spinous process fracture. Assessment of the bones is otherwise limited by the degree of motion. Cord: Assessment severely limited by motion. Posterior Fossa, vertebral arteries, paraspinal tissues: Negative. Disc levels: C1-2: Unremarkable. C2-3: No disc herniation. There is no spinal canal stenosis. No neural foraminal stenosis. C3-4: Facet arthrosis, left-greater-than-right, and left foraminal disc protrusion. There is no spinal canal stenosis. Severe left neural foraminal stenosis. C4-5: Severe left facet arthrosis. There is no spinal canal stenosis. Severe left neural foraminal stenosis. C5-6: No disc herniation, spinal canal stenosis or neural impingement C6-7: No disc herniation, spinal canal stenosis or neural impingement .  C7-T1: No disc herniation, spinal canal stenosis or neural impingement IMPRESSION: 1. Severely motion degraded study. 2. Edema at the site of the superior C7 endplate fracture and C6 spinous process fracture. 3. Severe left C3-4 and C4-5 neural foraminal stenosis secondary to combination of facet arthrosis and left foraminal disc protrusion. 4. Cervicothoracic segmentation anomalies with marked scoliosis. Electronically Signed   By: Ulyses Jarred M.D.   On: 07/10/2021 21:34   DG Chest Port 1 View  Result Date: 07/13/2021 CLINICAL DATA:  88 year old female with history of shortness of breath. EXAM: PORTABLE CHEST 1 VIEW COMPARISON:  Chest x-ray 07/12/2021. FINDINGS: Lung volumes are low. No consolidative airspace disease. No pleural effusions. Widespread interstitial prominence, most evident throughout the mid to lower lungs bilaterally, similar to the prior examination no pneumothorax. No evidence of pulmonary edema. Heart size is normal. Numerous calcified mediastinal and bilateral hilar lymph nodes are incidentally noted. Upper mediastinal contours are distorted by patient positioning. Atherosclerosis in the thoracic aorta. IMPRESSION: 1. Low lung volumes with diffuse interstitial prominence, most evident throughout the mid to lower lungs bilaterally, concerning for interstitial lung disease. Further evaluation with nonemergent high-resolution chest CT is recommended in the near future to better evaluate these findings. 2. Aortic atherosclerosis. Electronically Signed   By: Vinnie Langton M.D.   On: 07/13/2021 06:36   ECHOCARDIOGRAM COMPLETE  Result Date: 07/12/2021    ECHOCARDIOGRAM REPORT   Patient Name:   ADYLEE LEONARDO Date of Exam: 07/12/2021 Medical Rec #:  [id number]           Height:       61.0 in Accession #:    [id number]          Weight:  125.0 lb Date of Birth:  [birth year]          BSA:          1.547 m Patient Age:    73 years            BP:           136/84 mmHg Patient Gender: F                    HR:           71 bpm. Exam Location:  Inpatient Procedure: 2D Echo, Cardiac Doppler and Color Doppler Indications:    CHF  History:        Patient has prior history of Echocardiogram examinations, most                 recent 07/28/2019. CHF; Risk Factors:Hypertension and                 Dyslipidemia.  Sonographer:    Luisa Hart RDCS Referring Phys: [id number] [place]  1. Prominent basal septal hypertrophy and hypertrophied papilary muscles no mid cavitary gradient recoreded No SAM . Left ventricular ejection fraction, by estimation, is 60 to 65%. The left ventricle has normal function. The left ventricle has no regional wall motion abnormalities. There is severe asymmetric left ventricular hypertrophy of the basal and septal segments. Left ventricular diastolic parameters are consistent with Grade II diastolic dysfunction (pseudonormalization). Elevated left ventricular end-diastolic pressure.  2. Right ventricular systolic function is normal. The right ventricular size is normal.  3. The pericardial effusion is posterior to the left ventricle.  4. The mitral valve is abnormal. Trivial mitral valve regurgitation. No evidence of mitral stenosis.  5. Small gradient across AV but morphologically opens well . The aortic valve is tricuspid. There is mild calcification of the aortic valve. There is mild thickening of the aortic valve. Aortic valve regurgitation is mild. Aortic valve sclerosis is present, with no evidence of aortic valve stenosis.  6. The inferior vena cava is normal in size with greater than 50% respiratory variability, suggesting right atrial pressure of 3 mmHg. FINDINGS  Left Ventricle: Prominent basal septal hypertrophy and hypertrophied papilary muscles no mid cavitary gradient recoreded No SAM. Left ventricular ejection fraction, by estimation, is 60 to 65%. The left ventricle has normal function. The left ventricle has no regional wall motion abnormalities. The left  ventricular internal cavity size was normal in size. There is severe asymmetric left ventricular hypertrophy of the basal and septal segments. Left ventricular diastolic parameters are consistent with Grade II diastolic dysfunction (pseudonormalization). Elevated left ventricular end-diastolic pressure. Right Ventricle: The right ventricular size is normal. No increase in right ventricular wall thickness. Right ventricular systolic function is normal. Left Atrium: Left atrial size was normal in size. Right Atrium: Right atrial size was normal in size. Pericardium: Trivial pericardial effusion is present. The pericardial effusion is posterior to the left ventricle. Mitral Valve: The mitral valve is abnormal. There is mild thickening of the mitral valve leaflet(s). There is moderate calcification of the mitral valve leaflet(s). Mild mitral annular calcification. Trivial mitral valve regurgitation. No evidence of mitral valve stenosis. MV peak gradient, 4.4 mmHg. The mean mitral valve gradient is 2.0 mmHg. Tricuspid Valve: The tricuspid valve is normal in structure. Tricuspid valve regurgitation is trivial. No evidence of tricuspid stenosis. Aortic Valve: Small gradient across AV but morphologically opens well. The aortic valve is tricuspid. There is mild calcification of the aortic  valve. There is mild thickening of the aortic valve. Aortic valve regurgitation is mild. Aortic regurgitation PHT measures 565 msec. Aortic valve sclerosis is present, with no evidence of aortic valve stenosis. Aortic valve mean gradient measures 7.0 mmHg. Aortic valve peak gradient measures 13.3 mmHg. Aortic valve area, by VTI measures 1.78 cm. Pulmonic Valve: The pulmonic valve was normal in structure. Pulmonic valve regurgitation is not visualized. No evidence of pulmonic stenosis. Aorta: The aortic root is normal in size and structure. Venous: The inferior vena cava is normal in size with greater than 50% respiratory variability,  suggesting right atrial pressure of 3 mmHg. IAS/Shunts: No atrial level shunt detected by color flow Doppler.  LEFT VENTRICLE PLAX 2D LVIDd:         3.00 cm     Diastology LVIDs:         1.40 cm     LV e' medial:    4.43 cm/s LV PW:         1.00 cm     LV E/e' medial:  17.2 LV IVS:        1.80 cm     LV e' lateral:   6.30 cm/s LVOT diam:     1.80 cm     LV E/e' lateral: 12.1 LV SV:         57 LV SV Index:   37 LVOT Area:     2.54 cm  LV Volumes (MOD) LV vol d, MOD A2C: 29.0 ml LV vol d, MOD A4C: 22.0 ml LV vol s, MOD A2C: 7.9 ml LV vol s, MOD A4C: 4.8 ml LV SV MOD A2C:     21.1 ml LV SV MOD A4C:     22.0 ml LV SV MOD BP:      20.8 ml RIGHT VENTRICLE RV Basal diam:  2.60 cm RV Mid diam:    2.60 cm RV S prime:     13.40 cm/s TAPSE (M-mode): 1.6 cm LEFT ATRIUM             Index        RIGHT ATRIUM          Index LA diam:        3.20 cm 2.07 cm/m   RA Area:     8.13 cm LA Vol (A2C):   40.2 ml 25.99 ml/m  RA Volume:   12.60 ml 8.15 ml/m LA Vol (A4C):   39.1 ml 25.28 ml/m LA Biplane Vol: 41.9 ml 27.09 ml/m  AORTIC VALVE                     PULMONIC VALVE AV Area (Vmax):    1.76 cm      PV Vmax:       1.00 m/s AV Area (Vmean):   1.76 cm      PV Vmean:      65.550 cm/s AV Area (VTI):     1.78 cm      PV VTI:        0.196 m AV Vmax:           182.50 cm/s   PV Peak grad:  4.0 mmHg AV Vmean:          120.000 cm/s  PV Mean grad:  2.5 mmHg AV VTI:            0.322 m AV Peak Grad:      13.3 mmHg AV Mean Grad:      7.0 mmHg LVOT  Vmax:         126.00 cm/s LVOT Vmean:        83.200 cm/s LVOT VTI:          0.225 m LVOT/AV VTI ratio: 0.70 AI PHT:            565 msec  AORTA Ao Root diam: 2.60 cm Ao Asc diam:  3.20 cm MITRAL VALVE               TRICUSPID VALVE MV Area (PHT): 3.42 cm    TR Peak grad:   33.9 mmHg MV Area VTI:   1.90 cm    TR Vmax:        291.00 cm/s MV Peak grad:  4.4 mmHg MV Mean grad:  2.0 mmHg    SHUNTS MV Vmax:       1.05 m/s    Systemic VTI:  0.22 m MV Vmean:      60.5 cm/s   Systemic Diam: 1.80 cm MV  Decel Time: 222 msec MV E velocity: 76.20 cm/s MV A velocity: 82.50 cm/s MV E/A ratio:  0.92 Jenkins Rouge MD Electronically signed by Jenkins Rouge MD Signature Date/Time: 07/12/2021/2:40:27 PM    Final

## 2021-07-17 ENCOUNTER — Inpatient Hospital Stay: Payer: Self-pay

## 2021-07-17 LAB — CBC
HCT: 35.8 % — ABNORMAL LOW (ref 36.0–46.0)
Hemoglobin: 13.2 g/dL (ref 12.0–15.0)
MCH: 34 pg (ref 26.0–34.0)
MCHC: 36.9 g/dL — ABNORMAL HIGH (ref 30.0–36.0)
MCV: 92.3 fL (ref 80.0–100.0)
Platelets: 289 10*3/uL (ref 150–400)
RBC: 3.88 MIL/uL (ref 3.87–5.11)
RDW: 15.8 % — ABNORMAL HIGH (ref 11.5–15.5)
WBC: 12.7 10*3/uL — ABNORMAL HIGH (ref 4.0–10.5)
nRBC: 0 % (ref 0.0–0.2)

## 2021-07-17 LAB — BASIC METABOLIC PANEL
Anion gap: 9 (ref 5–15)
BUN: 49 mg/dL — ABNORMAL HIGH (ref 8–23)
CO2: 30 mmol/L (ref 22–32)
Calcium: 8.9 mg/dL (ref 8.9–10.3)
Chloride: 88 mmol/L — ABNORMAL LOW (ref 98–111)
Creatinine, Ser: 0.68 mg/dL (ref 0.44–1.00)
GFR, Estimated: >60 mL/min (ref >60)
Glucose, Bld: 106 mg/dL — ABNORMAL HIGH (ref 70–99)
Potassium: 3.2 mmol/L — ABNORMAL LOW (ref 3.5–5.1)
Sodium: 127 mmol/L — ABNORMAL LOW (ref 135–145)

## 2021-07-17 LAB — PHOSPHORUS: Phosphorus: 4.8 mg/dL — ABNORMAL HIGH (ref 2.5–4.6)

## 2021-07-17 MED ORDER — POTASSIUM CHLORIDE CRYS ER 20 MEQ PO TBCR
40.0000 meq | EXTENDED_RELEASE_TABLET | Freq: Four times a day (QID) | ORAL | Status: AC
  Administered 2021-07-17 (×2): 40 meq via ORAL
  Filled 2021-07-17 (×2): qty 2

## 2021-07-17 NOTE — Progress Notes (Addendum)
PROGRESS NOTE                                                                                                                                                                                                             Lorraine Demographics:    Lorraine Brandt, is a 88 y.o. female, DOB - 01-10-1934, YHC:[id number]  Outpatient Primary MD for the Lorraine is Reynold Bowen, MD    LOS - 5  Admit date - 07/12/2021    Chief Complaint  Lorraine presents with   Altered Mental Status       Brief Narrative (HPI from H&P)      Lorraine Brandt  is a 88 y.o. female, with history of osteoarthritis, hypothyroidism, hypertension, polymyalgia rheumatica, dyslipidemia who has been gradually getting weaker over the last few days, also has developed problems laying flat as she gets short of breath, she sustained 2 falls over the last 2 weeks and one of the falls she hurt her neck, she was diagnosed with C6-C7 fracture she saw neurosurgery outpatient and was placed in a c-collar.  She was also placed on a steroid taper.  She continues to get weak and presented to the ER today where she was found to have severe hyponatremia.   Subjective:   Lorraine had hospital delirium yesterday, she remains mildly confused today, no significant events overnight as discussed with staff, she had poor appetite with breakfast but she had drank her Ensure.     Assessment  & Plan :     Euvolemic hyponatremia due to SIADH : -Presents with generalized weakness, falls, and this is likely due to  her hyponatremia . - due to SIADH with urine osmolality of 455, sodium of 77, serum osmolality 243. -Most likely combination of tapering her off steroids, fall with head trauma, and using Lexapro, with possible low  solute intake with normal fluid intake . -She required hypertonic saline, currently on fluid restriction, urea and the Lasix . -Discussed with renal, so far no indication for Samsca given it is improving gradually.   -Given improved to 127 today. -Continue to hold Lexapro  Acute on chronic diastolic CHF last EF 70%.  Echo noted ++ d CHF, BNP high, has orthopnea, SOB on exertion and crackles on exam, chest x-ray also shows fluid overload, continue diuresis as above.  If blood pressure allows will try to continue low-dose beta-blocker.    CT noted with chronic interstitial changes  - will require outpatient pulmonary follow-up postdischarge.   Recent fall at home with C7 endplate and C6 vertebral body fracture.  She has seen neurosurgery in the outpatient setting currently had a c-collar which she has to wear at all times till she sees neurosurgery and they take it off.  She is finishing her prednisone taper in the next few days.   Hypertension. -Lorraine is on Cardizem CD 300 mg oral daily, losartan, during hospital stay her regimen has been adjusted to Coreg, lower dose Cardizem and hydralazine, overall blood pressure is soft I will discontinue her hydralazine for now .   Osteoarthritis.  Supportive care.  Hypothyroidism -TSH on the lower side at 0.32, possible sick euthyroid, but I will go ahead and decrease her Synthroid from 125 to 100 mcg.  Dyslipidemia.  On statin.  Hypokalemia -Potassium low at 3.2 today, repleted, recheck in a.m. with magnesium level.   Depression.   -Continue to hold Lexapro due to hyponatremia, discontinue on discharge.  Lorraine with history of right retinal artery occlusion, where she has been evaluated by EP and cardiology for A-fib work-up, status post TEE and 30 days heart monitor, cannot review these results, she is with questionable a flutter on EKG on admission, and some irregular rhythm yesterday resembles A-fib, but difficult to interpret, I have  requested cardiology to evaluate the Lorraine if she is having paroxysmal A-fib.      Condition - Fair  Family Communication  : Discussed with her daughter Dr. Steward Drone by phone 3/7, discussed with daughter Olivia Mackie at bedside today.  Code Status :  Full  Consults  :  Renal  PUD Prophylaxis : PPI   Procedures  :     TTE - 1. Prominent basal septal hypertrophy and hypertrophied papilary muscles no mid cavitary gradient recoreded No SAM . Left ventricular ejection fraction, by estimation, is 60 to 65%. The left ventricle has normal function. The left ventricle has no regional wall motion abnormalities. There is severe asymmetric left ventricular hypertrophy of the basal and septal segments. Left ventricular diastolic parameters are consistent with Grade II diastolic dysfunction (pseudonormalization). Elevated left ventricular end-diastolic pressure.  2. Right ventricular systolic function is normal. The right ventricular size is normal.  3. The pericardial effusion is posterior to the left ventricle.  4. The mitral valve is abnormal. Trivial mitral valve regurgitation. No evidence of mitral stenosis.  5. Small gradient across AV but morphologically opens well . The aortic valve is tricuspid. There is mild calcification of the aortic valve. There is mild thickening of the aortic valve. Aortic valve regurgitation is mild. Aortic valve sclerosis is present, with no evidence of aortic valve stenosis.  6. The  inferior vena cava is normal in size with greater than 50% respiratory variability, suggesting right atrial pressure of 3 mmHg.      Disposition Plan  :    Status is: Inpatient  DVT Prophylaxis  :    Place TED hose Start: 07/13/21 1028 heparin injection 5,000 Units Start: 07/12/21 1400  Lab Results  Component Value Date   PLT 289 07/17/2021    Diet :  Diet Order             Diet regular Room service appropriate? Yes with Assist; Fluid consistency: Thin; Fluid restriction: Other (see  comments)  Diet effective now                    Inpatient Medications  Scheduled Meds:  carvedilol  3.125 mg Oral BID WC   diltiazem  180 mg Oral q morning   feeding supplement  237 mL Oral BID BM   heparin  5,000 Units Subcutaneous Q8H   hydrALAZINE  50 mg Oral Q8H   hydrocerin   Topical Daily   levothyroxine  100 mcg Oral Once per day on Mon Tue Wed Thu Fri   pantoprazole  40 mg Oral Daily   predniSONE  5 mg Oral Q48H   rosuvastatin  20 mg Oral Daily   urea  15 g Oral BID   Continuous Infusions:   PRN Meds:.acetaminophen **OR** acetaminophen, bisacodyl, dextromethorphan-guaiFENesin, melatonin, ondansetron **OR** ondansetron (ZOFRAN) IV, traMADol  Antibiotics  :    Anti-infectives (From admission, onward)    None        Phillips Climes M.D on 07/17/2021 at 2:15 PM  To page go to [email or web address]   [facility] -  Office  [phone]  See all Orders from today for further details    Objective:   Vitals:   07/17/21 0000 07/17/21 0532 07/17/21 0809 07/17/21 1146  BP: 103/62 117/64 134/63 123/81  Pulse: 85 84 88 82  Resp: '16 17 16 18  '$ Temp: 98.1 F (36.7 C) (!) 96.9 F (36.1 C) 97.9 F (36.6 C) 98 F (36.7 C)  TempSrc: Axillary Axillary Oral Oral  SpO2: 94% 95% 97% 93%  Weight:      Height:        Wt Readings from Last 3 Encounters:  07/13/21 60.2 kg  07/03/21 56.7 kg  07/28/19 58.5 kg     Intake/Output Summary (Last 24 hours) at 07/17/2021 1415 Last data filed at 07/17/2021 0709 Gross per 24 hour  Intake --  Output 1100 ml  Net -1100 ml     Physical Exam  Awake Alert, frail, deconditioned, still wearing c-collar with upper airway congestion. Symmetrical Chest wall movement, Good air movement bilaterally, CTAB RRR,No Gallops,Rubs or new Murmurs, No Parasternal Heave +ve B.Sounds, Abd Soft, No tenderness, No rebound - guarding or rigidity. No Cyanosis, Clubbing or edema, No new Rash or bruise         Data Review:     CBC Recent Labs  Lab 07/12/21 0945 07/12/21 1005 07/13/21 0201 07/14/21 0420 07/14/21 2359 07/16/21 0352 07/17/21 0428  WBC 15.5*  --  14.3* 12.2* 11.0* 11.6* 12.7*  HGB 13.5   < > 12.6 12.0 11.8* 12.7 13.2  HCT 36.5   < > 33.9* 32.4* 32.6* 34.5* 35.8*  PLT 268  --  254 240 235 249 289  MCV 89.5  --  89.4 89.5 91.8 90.8 92.3  MCH 33.1  --  33.2 33.1 33.2 33.4 34.0  MCHC 37.0*  --  37.2* 37.0* 36.2* 36.8* 36.9*  RDW 14.9  --  15.0 15.2 15.5 15.7* 15.8*  LYMPHSABS 1.4  --  1.2 0.6* 0.5* 0.7  --   MONOABS 1.0  --  0.6 0.5 0.5 0.6  --   EOSABS 0.1  --  0.1 0.0 0.1 0.1  --   BASOSABS 0.0  --  0.0 0.0 0.0 0.1  --    < > = values in this interval not displayed.    Recent Labs  Lab 07/12/21 0945 07/12/21 1005 07/12/21 1250 07/12/21 1525 07/13/21 0201 07/13/21 1440 07/14/21 0042 07/14/21 0420 07/14/21 0735 07/14/21 2359 07/15/21 0751 07/16/21 0352 07/16/21 0742 07/16/21 1159 07/16/21 1613 07/17/21 0428  NA 114*   < >  --    < > 116*   116*   < >  --  120*   < > 123*   < > 126* 124* 126* 126* 127*  K 3.8   < >  --   --  3.2*  --   --  4.1  --  3.7  --  3.9  --   --  3.6 3.2*  CL 82*   < >  --   --  83*  --   --  89*  --  88*  --  89*  --   --  91* 88*  CO2 24  --   --   --  27  --   --  25  --  27  --  27  --   --  25 30  GLUCOSE 86   < >  --   --  91  --   --  100*  --  114*  --  99  --   --  145* 106*  BUN 16   < >  --   --  12  --   --  12  --  45*  --  40*  --   --  61* 49*  CREATININE 0.46   < >  --   --  0.40*  --   --  0.48  --  0.59  --  0.56  --   --  1.04* 0.68  CALCIUM 7.9*  --   --   --  7.7*  --   --  7.6*  --  8.2*  --  8.9  --   --  8.9 8.9  AST 27  --   --   --  19  --   --  17  --  24  --  26  --   --   --   --   ALT 25  --   --   --  25  --   --  25  --  29  --  32  --   --   --   --   ALKPHOS 73  --   --   --  79  --   --  86  --  92  --  93  --   --   --   --   BILITOT 1.3*  --   --   --  1.0  --   --  0.5  --  0.5  --  1.1  --   --   --   --    ALBUMIN 3.3*  --   --   --  2.9*  --   --  2.8*  --  2.8*  --  2.9*  --   --   --   --   MG 1.8  --   --   --  1.7  --   --  2.2  --  2.0  --  2.0  --   --   --   --   CRP  --   --  <0.5  --  0.5  --   --  1.8*  --  5.3*  --  6.3*  --   --   --   --   PROCALCITON  --   --   --   --  <0.10   <0.10  --   --  <0.10  --   --   --   --   --   --   --   --   TSH  --   --   --   --   --   --  0.329*  --   --   --   --   --   --   --   --   --   BNP 204.3*  --   --   --  221.1*  --   --  136.0*  --  112.0*  --  83.6  --   --   --   --    < > = values in this interval not displayed.    ------------------------------------------------------------------------------------------------------------------ No results for input(s): CHOL, HDL, LDLCALC, TRIG, CHOLHDL, LDLDIRECT in the last 72 hours.  No results found for: HGBA1C ------------------------------------------------------------------------------------------------------------------ No results for input(s): TSH, T4TOTAL, T3FREE, THYROIDAB in the last 72 hours.  Invalid input(s): FREET3   Cardiac Enzymes No results for input(s): CKMB, TROPONINI, MYOGLOBIN in the last 168 hours.  Invalid input(s): CK ------------------------------------------------------------------------------------------------------------------    Component Value Date/Time   BNP 83.6 07/16/2021 0352   Radiology Reports DG Chest 2 View  Result Date: 07/12/2021 CLINICAL DATA:  Pt brought from home for c/o intermittent AMS, increased general weakness and decreased mobility. Pt has non productive cough. Pt denies any CP but states she has been feeling SOB x a few days Nonsmoker Hx of HTN, cancer, GERD, EXAM: CHEST - 2 VIEW COMPARISON:  04/19/2014 FINDINGS: Progressive development of coarse peripheral interstitial opacities, worse left than right, involving bases more than apices. Heart size upper limits normal.  Tortuous ectatic thoracic aorta. No effusion. Levoscoliosis in the  upper thoracic spine with suspected vertebral segmentation anomaly, incompletely characterized. IMPRESSION: 1. Coarse asymmetric interstitial opacities left greater than right, suggesting progressive chronic interstitial lung disease. Cannot exclude superimposed interstitial infiltrate or asymmetric edema. Electronically Signed   By: Lucrezia Europe M.D.   On: 07/12/2021 10:10   DG Lumbar Spine Complete  Result Date: 07/03/2021 CLINICAL DATA:  Status post fall with lower back pain. EXAM: LUMBAR SPINE - COMPLETE 4+ VIEW COMPARISON:  February 25, 2021 FINDINGS: There is no evidence of acute lumbar spine fracture. Mild dextroscoliosis is seen with stable grade 1 anterolisthesis of the L4 vertebral body on L5. Mild to moderate severity multilevel endplate sclerosis is seen. Stable marked severity intervertebral disc space narrowing is noted at the level of L4-L5. IMPRESSION: 1. No evidence of acute lumbar spine fracture. 2. Stable marked severity degenerative disc disease at L4-L5 with stable grade 1 anterolisthesis of the L4 vertebral body. Electronically Signed   By: Virgina Norfolk M.D.   On: 07/03/2021 02:47   CT Head Wo Contrast  Result Date: 07/04/2021 CLINICAL DATA:  Trauma EXAM: CT  HEAD WITHOUT CONTRAST TECHNIQUE: Contiguous axial images were obtained from the base of the skull through the vertex without intravenous contrast. RADIATION DOSE REDUCTION: This exam was performed according to the departmental dose-optimization program which includes automated exposure control, adjustment of the mA and/or kV according to Lorraine size and/or use of iterative reconstruction technique. COMPARISON:  07/03/2021 FINDINGS: Brain: No acute intracranial findings are seen. There are no signs of intracranial bleeding. There are no epidural or subdural fluid collections. Cortical sulci are prominent. There is decreased density in the periventricular white matter. Vascular: Unremarkable. Skull: No fracture is seen in the  calvarium. There is subcutaneous contusion/hematoma in the posterior parietal scalp. Sinuses/Orbits: There are no air-fluid levels. Other: None IMPRESSION: No acute intracranial findings are seen in noncontrast CT brain. There is subcutaneous contusion/hematoma in the parietal scalp. No fracture is seen in the calvarium. Electronically Signed   By: Elmer Picker M.D.   On: 07/04/2021 17:47   CT Head Wo Contrast  Result Date: 07/03/2021 CLINICAL DATA:  Recent slip and fall with headaches and neck pain, initial encounter EXAM: CT HEAD WITHOUT CONTRAST CT CERVICAL SPINE WITHOUT CONTRAST TECHNIQUE: Multidetector CT imaging of the head and cervical spine was performed following the standard protocol without intravenous contrast. Multiplanar CT image reconstructions of the cervical spine were also generated. RADIATION DOSE REDUCTION: This exam was performed according to the departmental dose-optimization program which includes automated exposure control, adjustment of the mA and/or kV according to Lorraine size and/or use of iterative reconstruction technique. COMPARISON:  04/18/2019 FINDINGS: CT HEAD FINDINGS Brain: No evidence of acute infarction, hemorrhage, hydrocephalus, extra-axial collection or mass lesion/mass effect. Chronic atrophic and ischemic changes are noted. Vascular: No hyperdense vessel or unexpected calcification. Skull: Normal. Negative for fracture or focal lesion. Sinuses/Orbits: No acute finding. Other: None. CT CERVICAL SPINE FINDINGS Alignment: Loss of the normal cervical lordosis is noted. Anterolisthesis of C4 on C5 is seen felt to be degenerative in nature. Skull base and vertebrae: 7 cervical segments are well visualized. Vertebral body height is well maintained. At least partial fusion at C5-6 is noted. Anterolisthesis of C4 on C5 is noted of a degenerative nature. No acute fracture or acute facet abnormality is noted. Multilevel facet hypertrophic changes are seen. Significant  scoliosis in the upper thoracic spine is noted concave to the left. There are findings suspicious for hemi vertebra between C6 and T1 vertebra on the left. Soft tissues and spinal canal: Surrounding soft tissue structures show vascular calcifications. Upper chest: Visualized lung apices are within normal limits. Other: None IMPRESSION: CT of the head: Chronic atrophic and ischemic changes. CT of the cervical spine: Multilevel degenerative change. No acute abnormality noted. 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 Electronically Signed   By: Inez Catalina M.D.   On: 07/03/2021 04:02   CT CHEST WO CONTRAST  Result Date: 07/14/2021 CLINICAL DATA:  Cough for greater than 8 weeks. Progressive weakness. EXAM: CT CHEST WITHOUT CONTRAST TECHNIQUE: Multidetector CT imaging of the chest was performed following the standard protocol without IV contrast. RADIATION DOSE REDUCTION: This exam was performed according to the departmental dose-optimization program which includes automated exposure control, adjustment of the mA and/or kV according to Lorraine size and/or use of iterative reconstruction technique. COMPARISON:  None. FINDINGS: Cardiovascular: Normal heart size. No pericardial effusion. Aortic atherosclerosis and coronary artery calcifications. Mediastinum/Nodes: No enlarged mediastinal or axillary lymph nodes. Thyroid gland, trachea, and esophagus demonstrate no significant findings. Lungs/Pleura: No pleural effusion, airspace consolidation, atelectasis, or pneumothorax.  No interstitial edema. Signs of chronic interstitial lung disease identified with peripheral predominant subpleural reticulation, traction bronchiectasis and multifocal patchy areas of ground-glass attenuation. Thickening of the peribronchovascular interstitium is also identified. No frank honeycombing noted. Calcified granuloma is noted within the left upper lobe. Upper Abdomen: No acute findings within the imaged portions of the upper abdomen.  Subcapsular cyst within medial segment of left hepatic lobe measures 9 mm. Aortic atherosclerotic calcifications. Musculoskeletal: Thoracolumbar scoliosis deformity with mild degenerative disc disease. No acute or suspicious osseous findings. IMPRESSION: 1. No acute cardiopulmonary abnormality. 2. Signs of chronic interstitial lung disease including peripheral subpleural reticulation, traction bronchiectasis and multifocal areas of ground-glass attenuation. Recommend follow-up imaging with high-resolution CT of the chest in 6 months to assess for temporal change in the appearance of the lungs. 3. Thoracolumbar scoliosis and degenerative disc disease. 4. Aortic Atherosclerosis (ICD10-I70.0). Electronically Signed   By: Kerby Moors M.D.   On: 07/14/2021 14:38   CT Cervical Spine Wo Contrast  Addendum Date: 07/04/2021   ADDENDUM REPORT: 07/04/2021 17:44 ADDENDUM: Imaging findings were relayed to Lorraine's provider Theodis Blaze by telephone call. Electronically Signed   By: Elmer Picker M.D.   On: 07/04/2021 17:44   Result Date: 07/04/2021 CLINICAL DATA:  Trauma EXAM: CT CERVICAL SPINE WITHOUT CONTRAST TECHNIQUE: Multidetector CT imaging of the cervical spine was performed without intravenous contrast. Multiplanar CT image reconstructions were also generated. RADIATION DOSE REDUCTION: This exam was performed according to the departmental dose-optimization program which includes automated exposure control, adjustment of the mA and/or kV according to Lorraine size and/or use of iterative reconstruction technique. COMPARISON:  07/03/2021 FINDINGS: Alignment: There is minimal anterolisthesis at C4-C5 level suggesting previous ligament injury and facet degeneration. There is congenital partial fusion of bodies of C5 and C6 vertebrae. There is a radiolucent line in the right lamina of C6 vertebra. There is radiolucent line in the left anterior superior C7 vertebra. There is congenital partial fusion of bodies  of T1 and T2 vertebrae. 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 Skull base and vertebrae: Degenerative changes are noted with bony spurs and facet hypertrophy at multiple levels in the cervical spine. Soft tissues and spinal canal: There is no significant central spinal stenosis. Disc levels: There is encroachment of neural foramina from C2-C7 levels. Upper chest: Unremarkable. Other: Thyroid is not seen suggesting atrophy or previous removal. IMPRESSION: There is radiolucent line in the right lamina of C6 vertebra. There is radiolucent line in the anterior superior left side body C7 vertebra. Possibility of recent fractures is not excluded. Follow-up MRI as clinically warranted should be considered. Cervical spondylosis with encroachment of neural foramina at multiple levels. Congenital deformity is seen in the lower cervical and upper thoracic spine. Other findings as described in the body of the report. Electronically Signed: By: Elmer Picker M.D. On: 07/04/2021 17:40   CT Cervical Spine Wo Contrast  Result Date: 07/03/2021 CLINICAL DATA:  Recent slip and fall with headaches and neck pain, initial encounter EXAM: CT HEAD WITHOUT CONTRAST CT CERVICAL SPINE WITHOUT CONTRAST TECHNIQUE: Multidetector CT imaging of the head and cervical spine was performed following the standard protocol without intravenous contrast. Multiplanar CT image reconstructions of the cervical spine were also generated. RADIATION DOSE REDUCTION: This exam was performed according to the departmental dose-optimization program which includes automated exposure control, adjustment of the mA and/or kV according to Lorraine size and/or use of iterative reconstruction technique. COMPARISON:  04/18/2019 FINDINGS: CT HEAD FINDINGS  Brain: No evidence of acute infarction, hemorrhage, hydrocephalus, extra-axial collection or mass lesion/mass effect. Chronic atrophic and ischemic changes are noted. Vascular:  No hyperdense vessel or unexpected calcification. Skull: Normal. Negative for fracture or focal lesion. Sinuses/Orbits: No acute finding. Other: None. CT CERVICAL SPINE FINDINGS Alignment: Loss of the normal cervical lordosis is noted. Anterolisthesis of C4 on C5 is seen felt to be degenerative in nature. Skull base and vertebrae: 7 cervical segments are well visualized. Vertebral body height is well maintained. At least partial fusion at C5-6 is noted. Anterolisthesis of C4 on C5 is noted of a degenerative nature. No acute fracture or acute facet abnormality is noted. Multilevel facet hypertrophic changes are seen. Significant scoliosis in the upper thoracic spine is noted concave to the left. There are findings suspicious for hemi vertebra between C6 and T1 vertebra on the left. Soft tissues and spinal canal: Surrounding soft tissue structures show vascular calcifications. Upper chest: Visualized lung apices are within normal limits. Other: None IMPRESSION: CT of the head: Chronic atrophic and ischemic changes. CT of the cervical spine: Multilevel degenerative change. No acute abnormality noted. 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 Electronically Signed   By: Inez Catalina M.D.   On: 07/03/2021 04:02   MR CERVICAL SPINE WO CONTRAST  Result Date: 07/10/2021 CLINICAL DATA:  Cervicalgia EXAM: MRI CERVICAL SPINE WITHOUT CONTRAST TECHNIQUE: Multiplanar, multisequence MR imaging of the cervical spine was performed. No intravenous contrast was administered. COMPARISON:  Cervical spine CT 07/04/2021 FINDINGS: Alignment: Segmentation anomalies at the cervicothoracic junction contribute to severe levoscoliosis of the upper thoracic spine. Grade 1 anterolisthesis at C3-4 and C4-5. Vertebrae: There is edema at the superior C7 endplate in the region of the fracture identified on the earlier CT. There is also edema at the site of the C6 spinous process fracture. Assessment of the bones is otherwise limited by the  degree of motion. Cord: Assessment severely limited by motion. Posterior Fossa, vertebral arteries, paraspinal tissues: Negative. Disc levels: C1-2: Unremarkable. C2-3: No disc herniation. There is no spinal canal stenosis. No neural foraminal stenosis. C3-4: Facet arthrosis, left-greater-than-right, and left foraminal disc protrusion. There is no spinal canal stenosis. Severe left neural foraminal stenosis. C4-5: Severe left facet arthrosis. There is no spinal canal stenosis. Severe left neural foraminal stenosis. C5-6: No disc herniation, spinal canal stenosis or neural impingement C6-7: No disc herniation, spinal canal stenosis or neural impingement . C7-T1: No disc herniation, spinal canal stenosis or neural impingement IMPRESSION: 1. Severely motion degraded study. 2. Edema at the site of the superior C7 endplate fracture and C6 spinous process fracture. 3. Severe left C3-4 and C4-5 neural foraminal stenosis secondary to combination of facet arthrosis and left foraminal disc protrusion. 4. Cervicothoracic segmentation anomalies with marked scoliosis. Electronically Signed   By: Ulyses Jarred M.D.   On: 07/10/2021 21:34   DG Chest Port 1 View  Result Date: 07/13/2021 CLINICAL DATA:  88 year old female with history of shortness of breath. EXAM: PORTABLE CHEST 1 VIEW COMPARISON:  Chest x-ray 07/12/2021. FINDINGS: Lung volumes are low. No consolidative airspace disease. No pleural effusions. Widespread interstitial prominence, most evident throughout the mid to lower lungs bilaterally, similar to the prior examination no pneumothorax. No evidence of pulmonary edema. Heart size is normal. Numerous calcified mediastinal and bilateral hilar lymph nodes are incidentally noted. Upper mediastinal contours are distorted by Lorraine positioning. Atherosclerosis in the thoracic aorta. IMPRESSION: 1. Low lung volumes with diffuse interstitial prominence, most evident throughout the mid to lower  lungs bilaterally,  concerning for interstitial lung disease. Further evaluation with nonemergent high-resolution chest CT is recommended in the near future to better evaluate these findings. 2. Aortic atherosclerosis. Electronically Signed   By: Vinnie Langton M.D.   On: 07/13/2021 06:36   ECHOCARDIOGRAM COMPLETE  Result Date: 07/12/2021    ECHOCARDIOGRAM REPORT   Lorraine Brandt Date of Exam: 07/12/2021 Medical Rec #:  [id number]           Height:       61.0 in Accession #:    [id number]          Weight:       125.0 lb Date of Birth:  May 15, 1933          BSA:          1.547 m Lorraine Age:    67 years            BP:           136/84 mmHg Lorraine Gender: F                   HR:           71 bpm. Exam Location:  Inpatient Procedure: 2D Echo, Cardiac Doppler and Color Doppler Indications:    CHF  History:        L[place]raine has prior history of Echocardiogram examinations, most                 recent 07/28/2019. CHF; Risk Factors:Hypertension and                 Dyslipidemia.  Sonographer:    Luisa Hart RDCS Referring Phys: [id number]intosh [place]  1. Prominent basal septal hypertrophy and hypertrophied papilary muscles no mid cavitary gradient recoreded No SAM . Left ventricular ejection fraction, by estimation, is 60 to 65%. The left ventricle has normal function. The left ventricle has no regional wall motion abnormalities. There is severe asymmetric left ventricular hypertrophy of the basal and septal segments. Left ventricular diastolic parameters are consistent with Grade II diastolic dysfunction (pseudonormalization). Elevated left ventricular end-diastolic pressure.  2. Right ventricular systolic function is normal. The right ventricular size is normal.  3. The pericardial effusion is posterior to the left ventricle.  4. The mitral valve is abnormal. Trivial mitral valve regurgitation. No evidence of mitral stenosis.  5. Small gradient across AV but morphologically opens well . The aortic valve is  tricuspid. There is mild calcification of the aortic valve. There is mild thickening of the aortic valve. Aortic valve regurgitation is mild. Aortic valve sclerosis is present, with no evidence of aortic valve stenosis.  6. The inferior vena cava is normal in size with greater than 50% respiratory variability, suggesting right atrial pressure of 3 mmHg. FINDINGS  Left Ventricle: Prominent basal septal hypertrophy and hypertrophied papilary muscles no mid cavitary gradient recoreded No SAM. Left ventricular ejection fraction, by estimation, is 60 to 65%. The left ventricle has normal function. The left ventricle has no regional wall motion abnormalities. The left ventricular internal cavity size was normal in size. There is severe asymmetric left ventricular hypertrophy of the basal and septal segments. Left ventricular diastolic parameters are consistent with Grade II diastolic dysfunction (pseudonormalization). Elevated left ventricular end-diastolic pressure. Right Ventricle: The right ventricular size is normal. No increase in right ventricular wall thickness. Right ventricular systolic function is normal. Left Atrium: Left atrial size was normal in size. Right Atrium: Right  atrial size was normal in size. Pericardium: Trivial pericardial effusion is present. The pericardial effusion is posterior to the left ventricle. Mitral Valve: The mitral valve is abnormal. There is mild thickening of the mitral valve leaflet(s). There is moderate calcification of the mitral valve leaflet(s). Mild mitral annular calcification. Trivial mitral valve regurgitation. No evidence of mitral valve stenosis. MV peak gradient, 4.4 mmHg. The mean mitral valve gradient is 2.0 mmHg. Tricuspid Valve: The tricuspid valve is normal in structure. Tricuspid valve regurgitation is trivial. No evidence of tricuspid stenosis. Aortic Valve: Small gradient across AV but morphologically opens well. The aortic valve is tricuspid. There is mild  calcification of the aortic valve. There is mild thickening of the aortic valve. Aortic valve regurgitation is mild. Aortic regurgitation PHT measures 565 msec. Aortic valve sclerosis is present, with no evidence of aortic valve stenosis. Aortic valve mean gradient measures 7.0 mmHg. Aortic valve peak gradient measures 13.3 mmHg. Aortic valve area, by VTI measures 1.78 cm. Pulmonic Valve: The pulmonic valve was normal in structure. Pulmonic valve regurgitation is not visualized. No evidence of pulmonic stenosis. Aorta: The aortic root is normal in size and structure. Venous: The inferior vena cava is normal in size with greater than 50% respiratory variability, suggesting right atrial pressure of 3 mmHg. IAS/Shunts: No atrial level shunt detected by color flow Doppler.  LEFT VENTRICLE PLAX 2D LVIDd:         3.00 cm     Diastology LVIDs:         1.40 cm     LV e' medial:    4.43 cm/s LV PW:         1.00 cm     LV E/e' medial:  17.2 LV IVS:        1.80 cm     LV e' lateral:   6.30 cm/s LVOT diam:     1.80 cm     LV E/e' lateral: 12.1 LV SV:         57 LV SV Index:   37 LVOT Area:     2.54 cm  LV Volumes (MOD) LV vol d, MOD A2C: 29.0 ml LV vol d, MOD A4C: 22.0 ml LV vol s, MOD A2C: 7.9 ml LV vol s, MOD A4C: 4.8 ml LV SV MOD A2C:     21.1 ml LV SV MOD A4C:     22.0 ml LV SV MOD BP:      20.8 ml RIGHT VENTRICLE RV Basal diam:  2.60 cm RV Mid diam:    2.60 cm RV S prime:     13.40 cm/s TAPSE (M-mode): 1.6 cm LEFT ATRIUM             Index        RIGHT ATRIUM          Index LA diam:        3.20 cm 2.07 cm/m   RA Area:     8.13 cm LA Vol (A2C):   40.2 ml 25.99 ml/m  RA Volume:   12.60 ml 8.15 ml/m LA Vol (A4C):   39.1 ml 25.28 ml/m LA Biplane Vol: 41.9 ml 27.09 ml/m  AORTIC VALVE                     PULMONIC VALVE AV Area (Vmax):    1.76 cm      PV Vmax:       1.00 m/s AV Area (Vmean):   1.76 cm  PV Vmean:      65.550 cm/s AV Area (VTI):     1.78 cm      PV VTI:        0.196 m AV Vmax:           182.50 cm/s    PV Peak grad:  4.0 mmHg AV Vmean:          120.000 cm/s  PV Mean grad:  2.5 mmHg AV VTI:            0.322 m AV Peak Grad:      13.3 mmHg AV Mean Grad:      7.0 mmHg LVOT Vmax:         126.00 cm/s LVOT Vmean:        83.200 cm/s LVOT VTI:          0.225 m LVOT/AV VTI ratio: 0.70 AI PHT:            565 msec  AORTA Ao Root diam: 2.60 cm Ao Asc diam:  3.20 cm MITRAL VALVE               TRICUSPID VALVE MV Area (PHT): 3.42 cm    TR Peak grad:   33.9 mmHg MV Area VTI:   1.90 cm    TR Vmax:        291.00 cm/s MV Peak grad:  4.4 mmHg MV Mean grad:  2.0 mmHg    SHUNTS MV Vmax:       1.05 m/s    Systemic VTI:  0.22 m MV Vmean:      60.5 cm/s   Systemic Diam: 1.80 cm MV Decel Time: 222 msec MV E velocity: 76.20 cm/s MV A velocity: 82.50 cm/s MV E/A ratio:  0.92 Jenkins Rouge MD Electronically signed by Jenkins Rouge MD Signature Date/Time: 07/12/2021/2:40:27 PM    Final    [place] EKG SITE RITE  Result Date: 07/17/2021 If Site Rite image not attached, placement could not be confirmed due to current cardiac rhythm.

## 2021-07-17 NOTE — Progress Notes (Signed)
?  X-cover Note: ?RN reports telemetry says pt is in intermittent afib. One EKG shows possible aflutter on admission. No mention of afib or flutter in progress notes. Asked RN to leave telemetry strip in pt's chart for rounder to review. Did not start anticoagulation. Will defer this to rounding team. ? ? ?Kristopher Oppenheim, DO ?[facility] ? ?

## 2021-07-17 NOTE — Consult Note (Signed)
Cardiology Consultation:   Patient ID: Lorraine Brandt MRN: [id number]; DOB: Jan 15, 1934  Admit date: 07/12/2021 Date of Consult: 07/17/2021  PCP:  Reynold Bowen, MD   [facility] Cardiologist:  Skeet Latch, MD      Patient Profile:   Lorraine Brandt is a 88 y.o. female with a hx of HTN, polymalgia rheumatica, HLD, thyroid adenocarcinoma s/p resection, hypothyroidism, CRAO of the R eye, OA, and SIADH who is being seen 07/17/2021 for the evaluation of an arrhythmia at the request of Dr. Waldron Labs.  History of Present Illness:   Ms. Lorraine Brandt is a 88 year old female with above medical history who was seen by Dr. Oval Linsey inFebruary 2021 for preop evaluation of central retinal artery occlusion.  There was no evidence of carotid stenosis on CT of neck.  Decided to proceed with a TEE to rule out PFO, however the patient was unable to tolerate TEE due to severe bronchospasm. Decided to complete an echocardiogram on 07/28/2019 that showed EF 60-65%, moderate LVH, grade II diastolic dysfunction, no intracardiac source of embolism. Patient also wore a 30 day event monitor in April 2021 that showed sinus rhythm (there was one episode suspicious for atrial fibrillation, but on review appeared to be sinus with PACs.) Patient has not been seen by cardiology since 2021.   Patient presented to the ED on 07/12/21 complaining of intermittent AMS, increased general weakness, decreased mobility. She has had multiple falls resulting in a C6-C7 fracture. Patient was admitted to the hospitalist service and was found to have severe hyponatremia. Patient was found to have some crackles on lung exam and orthopnea, but due to patient's severe hyponatremia nephrology managed diuresis. Echocardiogram showed LVEF 60-65%, grade II diastolic dysfunction.   Early in the morning on 3/8, the RN reported that telemetry said that the patient was in intermittent atrial flutter. Cardiology was asked to see the  patient. On review of EKGs, telemetry, it appears that the patient is in intermittent atrial flutter. Rate is well controlled.   On interview, patient reports that she has fallen 4 times in the past few weeks, once resulting in a cervical fracture. After her falls, she had increasing weakness, which prompted her daughter to bring her to the hospital on 3/3. Patient denies any plans for surgery. Denies any past cardiac history. Patient denies chest pain, palpitations, SOB.    Past Medical History:  Diagnosis Date   Arthritis    oa   Cancer ([place])    basal cell skin cancer, multiple areas removed, thryoid   Central retinal artery occlusion 07/07/2019   DJD (degenerative joint disease)    Essential hypertension 07/07/2019   GERD (gastroesophageal reflux disease)    takes prilosec    Hypertension    Hypothyroidism since 2001   Osteoporosis    Polymyalgia rheumatica ([place])    Pure hypercholesterolemia 07/07/2019   Sleep apnea    uses mouthpiece somnodent, pt wishes to leave mouthpiece at home, pt told moderate severe sleep apnea   Thyroid disease    Vaginal delivery 1958, [id number]    Past Surgical History:  Procedure Laterality Date   ANTERIOR AND POSTERIOR REPAIR N/A 11/02/2014   Procedure: ANTERIOR (CYSTOCELE) Repair AND Possible POSTERIOR REPAIR (RECTOCELE);  Surgeon: Princess Bruins, MD;  Location: [facility];  Service: Gynecology;  Laterality: N/A;   BUBBLE STUDY  07/28/2019   Procedure: BUBBLE STUDY;  Surgeon: [name], MD;  Location: [facility];  Service: Cardiovascular;;   CYSTOSCOPY N/A 11/02/2014   Procedure: CYSTOSCOPY;  Surgeon: Princess Bruins, MD;  Location: [facility];  Service: Gynecology;  Laterality: N/A;   DILATION AND CURETTAGE OF UTERUS     EYE SURGERY  2007   both eyes lens replacements for cataracts   LAMINECTOMY     ROBOTIC ASSISTED LAPAROSCOPIC SACROCOLPOPEXY N/A 11/02/2014   Procedure: ROBOTIC ASSISTED LAPAROSCOPIC Uterosacral ligament suspension for  SACROCOLPOPEXY;  Surgeon: Princess Bruins, MD;  Location: [facility];  Service: Gynecology;  Laterality: N/A;   ROBOTIC ASSISTED TOTAL HYSTERECTOMY WITH BILATERAL SALPINGO OOPHERECTOMY Bilateral 11/02/2014   Procedure: ROBOTIC ASSISTED TOTAL HYSTERECTOMY WITH BILATERAL SALPINGO OOPHORECTOMY;  Surgeon: Princess Bruins, MD;  Location: [facility];  Service: Gynecology;  Laterality: Bilateral;   THYROIDECTOMY  2001   and radiation done also, partial   TONSILLECTOMY     TOTAL KNEE ARTHROPLASTY Right 04/25/2014   Procedure: RIGHT TOTAL KNEE ARTHROPLASTY;  Surgeon: Gearlean Alf, MD;  Location: [facility];  Service: Orthopedics;  Laterality: Right;   TUBAL LIGATION       Home Medications:  Prior to Admission medications   Medication Sig Start Date End Date Taking? Authorizing Provider  ACETAMINOPHEN PO Take 650-1,300 mg by mouth every 6 (six) hours as needed for moderate pain or headache.   Yes [provider]  Ascorbic Acid (VITAMIN C) 500 MG CAPS Take 500 mg by mouth daily.   Yes [provider]  CALCIUM-VITAMIN D PO Take 1 tablet by mouth in the morning and at bedtime.    Yes [provider]  Carboxymethylcellul-Glycerin (LUBRICATING EYE DROPS OP) Place 1 drop into both eyes daily as needed (dry eyes).   Yes [provider]  cholecalciferol (VITAMIN D3) 25 MCG (1000 UT) tablet Take 1,000 Units by mouth daily.   Yes [provider]  diltiazem (CARDIZEM CD) 300 MG 24 hr capsule Take 300 mg by mouth every morning.    Yes [provider]  docusate sodium (COLACE) 100 MG capsule Take 100 mg by mouth daily as needed for mild constipation.   Yes [provider]  escitalopram (LEXAPRO) 5 MG tablet Take 5 mg by mouth daily. 04/01/19  Yes [provider]  levothyroxine (SYNTHROID, LEVOTHROID) 125 MCG tablet Take 125 mcg by mouth every Monday, Tuesday, Wednesday, Thursday, and Friday. On an empty stomach.   Yes [provider]   losartan (COZAAR) 100 MG tablet Take 100 mg by mouth daily.   Yes [provider]  Omega-3 Fatty Acids (FISH OIL PO) Take 2,000 mg by mouth daily.   Yes [provider]  omeprazole (PRILOSEC) 20 MG capsule Take 20 mg by mouth every morning.    Yes [provider]  predniSONE (DELTASONE) 5 MG tablet Take 15 mg by mouth daily with breakfast. Has epidural on 07-16-21   Yes [provider]  rosuvastatin (CRESTOR) 20 MG tablet Take 1 tablet (20 mg total) by mouth daily. 07/07/19 07/03/22 Yes Skeet Latch, MD    Inpatient Medications: Scheduled Meds:  carvedilol  3.125 mg Oral BID WC   diltiazem  180 mg Oral q morning   feeding supplement  237 mL Oral BID BM   heparin  5,000 Units Subcutaneous Q8H   hydrocerin   Topical Daily   levothyroxine  100 mcg Oral Once per day on Mon Tue Wed Thu Fri   pantoprazole  40 mg Oral Daily   predniSONE  5 mg Oral Q48H   rosuvastatin  20 mg Oral Daily   urea  15 g Oral BID   Continuous Infusions:  PRN Meds: acetaminophen **OR** acetaminophen, bisacodyl, dextromethorphan-guaiFENesin, melatonin, ondansetron **OR** ondansetron (ZOFRAN) IV, traMADol  Allergies:    Allergies  Allergen Reactions   Oxycodone Other (See Comments)    Makes me crazy   Celebrex [Celecoxib] Itching and Rash    Had itching, burning and rash to it. Likely adverse effect rather than allergic.   Latex Rash    Social History:   Social History   Socioeconomic History   Marital status: Married    Spouse name: Not on file   Number of children: Not on file   Years of education: Not on file   Highest education level: Not on file  Occupational History   Not on file  Tobacco Use   Smoking status: Never   Smokeless tobacco: Never  Substance and Sexual Activity   Alcohol use: Yes    Alcohol/week: 1.0 standard drink    Types: 1 Glasses of wine per week    Comment: wine 4 ounces most days   Drug use: No   Sexual activity: Not on file   Other Topics Concern   Not on file  Social History Narrative   Not on file   Social Determinants of Health   Financial Resource Strain: Not on file  Food Insecurity: Not on file  Transportation Needs: Not on file  Physical Activity: Not on file  Stress: Not on file  Social Connections: Not on file  Intimate Partner Violence: Not on file    Family History:    Family History  Problem Relation Age of Onset   Breast cancer Daughter      ROS:  Please see the history of present illness.   All other ROS reviewed and negative.     Physical Exam/Data:   Vitals:   07/17/21 0532 07/17/21 0809 07/17/21 1146 07/17/21 1522  BP: 117/64 134/63 123/81 106/66  Pulse: 84 88 82 81  Resp: '17 16 18 20  '$ Temp: (!) 96.9 F (36.1 C) 97.9 F (36.6 C) 98 F (36.7 C)   TempSrc: Axillary Oral Oral   SpO2: 95% 97% 93% 92%  Weight:      Height:        Intake/Output Summary (Last 24 hours) at 07/17/2021 1523 Last data filed at 07/17/2021 0709 Gross per 24 hour  Intake --  Output 1100 ml  Net -1100 ml   Last 3 Weights 07/13/2021 07/12/2021 07/03/2021  Weight (lbs) 132 lb 11.5 oz 125 lb 125 lb  Weight (kg) 60.2 kg 56.7 kg 56.7 kg     Body mass index is 25.08 kg/m.  General:  Frail, wearing a C-collar,  laying comfortably in the bed  HEENT: normal Neck: unable to assess JVD due to C collar  Vascular: Radial pulses 2+ bilaterally Cardiac:  normal S1, S2; RRR; grade 1/6 diastolic murmur at the LUSB Lungs:  anterior lung exam unlabored, some inspiratory wheezes that improved with cough  Abd: soft, mildly tender to deep palpation  Ext: no edema Musculoskeletal:  No deformities, BUE and BLE strength normal and equal Skin: warm and dry  Neuro:  CNs 2-12 intact, no focal abnormalities noted Psych:  Normal affect   EKG:  The EKG was personally reviewed and demonstrates:  EKG from 3/3 showed atrial flutter with a HR of 57 Telemetry:  Telemetry was personally reviewed and demonstrates:   Predominantly sinus rhythm with occasional episodes of atrial flutter   Relevant CV Studies:  Echo 07/12/21 . Prominent basal septal hypertrophy and hypertrophied papilary muscles  no mid  cavitary gradient recoreded No SAM . Left ventricular ejection  fraction, by estimation, is 60 to 65%. The left ventricle has normal  function. The left ventricle has no  regional wall motion abnormalities. There is severe asymmetric left  ventricular hypertrophy of the basal and septal segments. Left ventricular  diastolic parameters are consistent with Grade II diastolic dysfunction  (pseudonormalization). Elevated left  ventricular end-diastolic pressure.   2. Right ventricular systolic function is normal. The right ventricular  size is normal.   3. The pericardial effusion is posterior to the left ventricle.   4. The mitral valve is abnormal. Trivial mitral valve regurgitation. No  evidence of mitral stenosis.   5. Small gradient across AV but morphologically opens well . The aortic  valve is tricuspid. There is mild calcification of the aortic valve. There  is mild thickening of the aortic valve. Aortic valve regurgitation is  mild. Aortic valve sclerosis is  present, with no evidence of aortic valve stenosis.   6. The inferior vena cava is normal in size with greater than 50%  respiratory variability, suggesting right atrial pressure of 3 mmHg.   Laboratory Data:  High Sensitivity Troponin:  No results for input(s): TROPONINIHS in the last 720 hours.   Chemistry Recent Labs  Lab 07/14/21 0420 07/14/21 0735 07/14/21 2359 07/15/21 0751 07/16/21 0352 07/16/21 0742 07/16/21 1159 07/16/21 1613 07/17/21 0428  NA 120*   < > 123*   < > 126*   < > 126* 126* 127*  K 4.1  --  3.7  --  3.9  --   --  3.6 3.2*  CL 89*  --  88*  --  89*  --   --  91* 88*  CO2 25  --  27  --  27  --   --  25 30  GLUCOSE 100*  --  114*  --  99  --   --  145* 106*  BUN 12  --  45*  --  40*  --   --  61* 49*   CREATININE 0.48  --  0.59  --  0.56  --   --  1.04* 0.68  CALCIUM 7.6*  --  8.2*  --  8.9  --   --  8.9 8.9  MG 2.2  --  2.0  --  2.0  --   --   --   --   GFRNONAA >60  --  >60  --  >60  --   --  53* >60  ANIONGAP 6  --  8  --  10  --   --  10 9   < > = values in this interval not displayed.    Recent Labs  Lab 07/14/21 0420 07/14/21 2359 07/16/21 0352  PROT 5.0* 5.1* 5.4*  ALBUMIN 2.8* 2.8* 2.9*  AST '17 24 26  '$ ALT 25 29 32  ALKPHOS 86 92 93  BILITOT 0.5 0.5 1.1   Lipids No results for input(s): CHOL, TRIG, HDL, LABVLDL, LDLCALC, CHOLHDL in the last 168 hours.  Hematology Recent Labs  Lab 07/14/21 2359 07/16/21 0352 07/17/21 0428  WBC 11.0* 11.6* 12.7*  RBC 3.55* 3.80* 3.88  HGB 11.8* 12.7 13.2  HCT 32.6* 34.5* 35.8*  MCV 91.8 90.8 92.3  MCH 33.2 33.4 34.0  MCHC 36.2* 36.8* 36.9*  RDW 15.5 15.7* 15.8*  PLT 235 249 289   Thyroid  Recent Labs  Lab 07/14/21 0042  TSH 0.329*    BNP Recent Labs  Lab 07/14/21 0420 07/14/21 2359 07/16/21 0352  BNP 136.0* 112.0* 83.6    DDimer No results for input(s): DDIMER in the last 168 hours.   Radiology/Studies:  CT CHEST WO CONTRAST  Result Date: 07/14/2021 CLINICAL DATA:  Cough for greater than 8 weeks. Progressive weakness. EXAM: CT CHEST WITHOUT CONTRAST TECHNIQUE: Multidetector CT imaging of the chest was performed following the standard protocol without IV contrast. RADIATION DOSE REDUCTION: This exam was performed according to the departmental dose-optimization program which includes automated exposure control, adjustment of the mA and/or kV according to patient size and/or use of iterative reconstruction technique. COMPARISON:  None. FINDINGS: Cardiovascular: Normal heart size. No pericardial effusion. Aortic atherosclerosis and coronary artery calcifications. Mediastinum/Nodes: No enlarged mediastinal or axillary lymph nodes. Thyroid gland, trachea, and esophagus demonstrate no significant findings. Lungs/Pleura: No  pleural effusion, airspace consolidation, atelectasis, or pneumothorax. No interstitial edema. Signs of chronic interstitial lung disease identified with peripheral predominant subpleural reticulation, traction bronchiectasis and multifocal patchy areas of ground-glass attenuation. Thickening of the peribronchovascular interstitium is also identified. No frank honeycombing noted. Calcified granuloma is noted within the left upper lobe. Upper Abdomen: No acute findings within the imaged portions of the upper abdomen. Subcapsular cyst within medial segment of left hepatic lobe measures 9 mm. Aortic atherosclerotic calcifications. Musculoskeletal: Thoracolumbar scoliosis deformity with mild degenerative disc disease. No acute or suspicious osseous findings. IMPRESSION: 1. No acute cardiopulmonary abnormality. 2. Signs of chronic interstitial lung disease including peripheral subpleural reticulation, traction bronchiectasis and multifocal areas of ground-glass attenuation. Recommend follow-up imaging with high-resolution CT of the chest in 6 months to assess for temporal change in the appearance of the lungs. 3. Thoracolumbar scoliosis and degenerative disc disease. 4. Aortic Atherosclerosis (ICD10-I70.0). Electronically Signed   By: Kerby Moors M.D.   On: 07/14/2021 14:38   [place] EKG SITE RITE  Result Date: 07/17/2021 If Site Rite image not attached, placement could not be confirmed due to current cardiac rhythm.    Assessment and Plan:   Paroxysmal Atrial Flutter: CHADS-VASc 5 (age x2, gender, HTN, CHF) - EKG on 3/3 showed atrial flutter with a rate of 57 BPM - Patient was  taking cardizem cd 300 mg daily PTA  - Reduced cardizem to 180 mg daily and started coreg 3.125 this admission  - HR is well controlled on these medications  - Discussed anticoagulation with the patient, but concerned that patient has multiple recent falls. Discussed that there is a risk of stroke if the patient does not take  anticoagulation, but if she falls on anticoagulation she is at increased risk of severe bleeding. Patient is hesitant to start [facility], will discuss with MD   Acute on Chronic Diastolic HF  - Due to patient's hyponatremia/SIADH, nephrology is managing - Patient appeared euvolemic on exam    Otherwise per Primary  - Hyponatremia due to SIADH  - Recent fall at home with C7 endplate and C6 vertebral body fracture  - Osteoarthritis  - Hypothyroidism  - Hypokalemia  - Depression   Risk Assessment/Risk Scores:   New York Heart Association (NYHA) Functional Class NYHA Class II  CHA2DS2-VASc Score = 5  This indicates a 7.2% annual risk of stroke. The patient's score is based upon: CHF History: 1 HTN History: 1 Diabetes History: 0 Stroke History: 0 Vascular Disease History: 0 Age Score: 2 Gender Score: 1        For questions or updates, please contact [place] Please consult [email or web address] for contact info under    Signed,  Margie Billet, PA-C  07/17/2021 3:23 PM

## 2021-07-17 NOTE — Progress Notes (Signed)
Physical Therapy Treatment ?Patient Details ?Name: Lorraine Brandt ?MRN: [id number] ?DOB: 02/05/1934 ?Today's Date: 07/17/2021 ? ? ?History of Present Illness 88 y.o. female, who has been gradually getting weaker over the last few days, also has developed problems laying flat as she gets short of breath. She presented to the ER 07/12/21 where she was found to have severe hyponatremia--likely SIADH.  She sustained 2 falls over the last 2 weeks and one of the falls she hurt her neck, she was diagnosed with C6-C7 fracture she saw neurosurgery outpatient and was placed in a c-collar.  PMH- osteoarthritis, hypothyroidism, hypertension, polymyalgia rheumatica, dyslipidemia , ? ?  ?PT Comments  ? ? Pt received in bed, reporting not sleeping well due to multiple BMs. She appears weak and more fatigued today. Pt is very motivated and agreeable to OOB. She required mod assist bed mobility, mod assist sit to stand with RW, and total assist squat pivot transfer. Pt having difficulty attaining a full upright stance. Only able to maintain flexed stance with RW a few seconds during standing trials. Pt in recliner with feet elevated at end of session. She required 2L to maintain SpO2 in 90s with mobility. ?   ?Recommendations for follow up therapy are one component of a multi-disciplinary discharge planning process, led by the attending physician.  Recommendations may be updated based on patient status, additional functional criteria and insurance authorization. ? ?Follow Up Recommendations ? Skilled nursing-short term rehab (<3 hours/day) ?  ?  ?Assistance Recommended at Discharge Frequent or constant Supervision/Assistance  ?Patient can return home with the following A lot of help with walking and/or transfers;A lot of help with bathing/dressing/bathroom;Assistance with cooking/housework;Assist for transportation ?  ?Equipment Recommendations ? Rollator (4 wheels)  ?  ?Recommendations for Other Services   ? ? ?  ?Precautions /  Restrictions Precautions ?Precautions: Fall;Cervical ?Required Braces or Orthoses: Cervical Brace ?Cervical Brace: Hard collar;At all times  ?  ? ?Mobility ? Bed Mobility ?Overal bed mobility: Needs Assistance ?Bed Mobility: Rolling, Sidelying to Sit ?Rolling: Min assist ?Sidelying to sit: Mod assist, HOB elevated ?  ?  ?  ?General bed mobility comments: +rail, increased time, cues for seuquencing, use of bed pad to scoot to EOB ?  ? ?Transfers ?Overall transfer level: Needs assistance ?Equipment used: Rolling walker (2 wheels) ?Transfers: Sit to/from Stand, Bed to chair/wheelchair/BSC ?Sit to Stand: Mod assist, From elevated surface ?  ?  ?Squat pivot transfers: Total assist ?  ?  ?General transfer comment: mod assist sit to stand with RW x 2 trials. Only able to sustained a flexed posture stance a few seconds/trial. Unable to attempt pivot steps with RW. RW removed and total assist squat pivot transfer perform bed to recliner toward L. ?  ? ?Ambulation/Gait ?  ?  ?  ?  ?  ?  ?  ?General Gait Details: unable ? ? ?Stairs ?  ?  ?  ?  ?  ? ? ?Wheelchair Mobility ?  ? ?Modified Rankin (Stroke Patients Only) ?  ? ? ?  ?Balance Overall balance assessment: Needs assistance ?Sitting-balance support: Feet supported, Bilateral upper extremity supported, No upper extremity supported ?Sitting balance-Leahy Scale: Fair ?  ?  ?Standing balance support: Bilateral upper extremity supported, During functional activity ?Standing balance-Leahy Scale: Zero ?Standing balance comment: reliant on therapist support ?  ?  ?  ?  ?  ?  ?  ?  ?  ?  ?  ?  ? ?  ?Cognition Arousal/Alertness: Awake/alert ?Behavior  During Therapy: WFL for tasks assessed/performed, Anxious ?Overall Cognitive Status: Impaired/Different from baseline ?Area of Impairment: Memory, Attention, Following commands, Safety/judgement, Awareness, Problem solving ?  ?  ?  ?  ?  ?  ?  ?  ?  ?Current Attention Level: Sustained ?Memory: Decreased short-term memory ?Following  Commands: Follows one step commands with increased time ?Safety/Judgement: Decreased awareness of deficits, Decreased awareness of safety ?Awareness: Intellectual ?Problem Solving: Slow processing, Requires verbal cues, Requires tactile cues ?General Comments: pleasant and motivated. ?  ?  ? ?  ?Exercises   ? ?  ?General Comments General comments (skin integrity, edema, etc.): 2L O2 to maintain SpO2 in 90s during mobility. ?  ?  ? ?Pertinent Vitals/Pain Pain Assessment ?Pain Assessment: Faces ?Faces Pain Scale: Hurts little more ?Pain Location: back ?Pain Descriptors / Indicators: Grimacing, Discomfort ?Pain Intervention(s): Monitored during session, Limited activity within patient's tolerance, Repositioned  ? ? ?Home Living   ?  ?  ?  ?  ?  ?  ?  ?  ?  ?   ?  ?Prior Function    ?  ?  ?   ? ?PT Goals (current goals can now be found in the care plan section) Acute Rehab PT Goals ?Patient Stated Goal: regain strength ?Progress towards PT goals: Not progressing toward goals - comment (fatigue and weakness) ? ?  ?Frequency ? ? ? Min 3X/week ? ? ? ?  ?PT Plan Current plan remains appropriate  ? ? ?Co-evaluation   ?  ?  ?  ?  ? ?  ?AM-PAC PT "6 Clicks" Mobility   ?Outcome Measure ? Help needed turning from your back to your side while in a flat bed without using bedrails?: A Little ?Help needed moving from lying on your back to sitting on the side of a flat bed without using bedrails?: A Lot ?Help needed moving to and from a bed to a chair (including a wheelchair)?: Total ?Help needed standing up from a chair using your arms (e.g., wheelchair or bedside chair)?: A Lot ?Help needed to walk in hospital room?: Total ?Help needed climbing 3-5 steps with a railing? : Total ?6 Click Score: 10 ? ?  ?End of Session Equipment Utilized During Treatment: Gait belt ?Activity Tolerance: Treatment limited secondary to medical complications (Comment);Patient limited by fatigue (weakness) ?Patient left: in chair;with chair alarm set;with  call bell/phone within reach;with family/visitor present ?Nurse Communication: Mobility status ?PT Visit Diagnosis: Difficulty in walking, not elsewhere classified (R26.2);Muscle weakness (generalized) (M62.81);History of falling (Z91.81) ?  ? ? ?Time: 0768-0881 ?PT Time Calculation (min) (ACUTE ONLY): 25 min ? ?Charges:  $Therapeutic Activity: 23-37 mins          ?          ? ?Lorrin Goodell, PT  ?Office # [phone] ?Pager [phone] ? ? ? ?Lorraine Brandt ?07/17/2021, 11:19 AM ? ?

## 2021-07-18 ENCOUNTER — Inpatient Hospital Stay (HOSPITAL_COMMUNITY): Payer: Medicare Other

## 2021-07-18 DIAGNOSIS — R410 Disorientation, unspecified: Secondary | ICD-10-CM

## 2021-07-18 DIAGNOSIS — J9601 Acute respiratory failure with hypoxia: Secondary | ICD-10-CM

## 2021-07-18 DIAGNOSIS — I4892 Unspecified atrial flutter: Secondary | ICD-10-CM

## 2021-07-18 LAB — BASIC METABOLIC PANEL
Anion gap: 9 (ref 5–15)
BUN: 50 mg/dL — ABNORMAL HIGH (ref 8–23)
CO2: 28 mmol/L (ref 22–32)
Calcium: 9.6 mg/dL (ref 8.9–10.3)
Chloride: 92 mmol/L — ABNORMAL LOW (ref 98–111)
Creatinine, Ser: 0.56 mg/dL (ref 0.44–1.00)
GFR, Estimated: >60 mL/min (ref >60)
Glucose, Bld: 105 mg/dL — ABNORMAL HIGH (ref 70–99)
Potassium: 5.2 mmol/L — ABNORMAL HIGH (ref 3.5–5.1)
Sodium: 129 mmol/L — ABNORMAL LOW (ref 135–145)

## 2021-07-18 LAB — CBC
HCT: 35.3 % — ABNORMAL LOW (ref 36.0–46.0)
Hemoglobin: 12.3 g/dL (ref 12.0–15.0)
MCH: 32.9 pg (ref 26.0–34.0)
MCHC: 34.8 g/dL (ref 30.0–36.0)
MCV: 94.4 fL (ref 80.0–100.0)
Platelets: 267 10*3/uL (ref 150–400)
RBC: 3.74 MIL/uL — ABNORMAL LOW (ref 3.87–5.11)
RDW: 16.3 % — ABNORMAL HIGH (ref 11.5–15.5)
WBC: 11.8 10*3/uL — ABNORMAL HIGH (ref 4.0–10.5)
nRBC: 0 % (ref 0.0–0.2)

## 2021-07-18 LAB — MAGNESIUM: Magnesium: 2.2 mg/dL (ref 1.7–2.4)

## 2021-07-18 MED ORDER — CARVEDILOL 12.5 MG PO TABS
12.5000 mg | ORAL_TABLET | Freq: Two times a day (BID) | ORAL | Status: DC
  Administered 2021-07-18: 18:00:00 12.5 mg via ORAL
  Filled 2021-07-18: qty 1

## 2021-07-18 MED ORDER — ENSURE ENLIVE PO LIQD: 237.0000 mL | Freq: Two times a day (BID) | ORAL | 12 refills | Status: DC

## 2021-07-18 MED ORDER — DILTIAZEM HCL ER COATED BEADS 180 MG PO CP24
180.0000 mg | ORAL_CAPSULE | Freq: Every morning | ORAL | Status: AC
Start: 2021-07-19 — End: ?

## 2021-07-18 MED ORDER — BISACODYL 5 MG PO TBEC
5.0000 mg | DELAYED_RELEASE_TABLET | Freq: Every day | ORAL | 0 refills | Status: AC | PRN
Start: 2021-07-18 — End: ?

## 2021-07-18 MED ORDER — ACETAMINOPHEN 325 MG PO TABS: 650.0000 mg | ORAL_TABLET | Freq: Four times a day (QID) | ORAL | Status: DC | PRN

## 2021-07-18 MED ORDER — MELATONIN 10 MG PO TABS
10.0000 mg | ORAL_TABLET | Freq: Every evening | ORAL | 0 refills | Status: AC | PRN
Start: 2021-07-18 — End: ?

## 2021-07-18 MED ORDER — CARVEDILOL 12.5 MG PO TABS: 12.5000 mg | ORAL_TABLET | Freq: Two times a day (BID) | ORAL | Status: AC

## 2021-07-18 MED ORDER — LEVOTHYROXINE SODIUM 100 MCG PO TABS: 100.0000 ug | ORAL_TABLET | Freq: Every day | ORAL | Status: AC

## 2021-07-18 NOTE — Progress Notes (Signed)
[facility]charged with [facility] to [facility] . 140/83, pulse 96 on 2 liters. Per [facility] : Leave in gown . She will be examined on arrival . They state they will take clothes and shoes with them. Placed on R/A sats 95-97  .  ?

## 2021-07-18 NOTE — Discharge Instructions (Signed)
Follow with Primary MD Reynold Bowen, MD /SNF physician ?Get CBC, CMP,  checked  by Primary MD next visit.  ? ? ?Activity: As tolerated with Full fall precautions use walker/cane & assistance as needed ? ? ?Disposition SNF ? ? ?Diet: Regular with 1200 cc fluid restriction ? ? ?On your next visit with your primary care physician please Get Medicines reviewed and adjusted. ? ? ?Please request your Prim.MD to go over all Hospital Tests and Procedure/Radiological results at the follow up, please get all Hospital records sent to your Prim MD by signing hospital release before you go home. ? ? ?If you experience worsening of your admission symptoms, develop shortness of breath, life threatening emergency, suicidal or homicidal thoughts you must seek medical attention immediately by calling 911 or calling your MD immediately  if symptoms less severe. ? ?You Must read complete instructions/literature along with all the possible adverse reactions/side effects for all the Medicines you take and that have been prescribed to you. Take any new Medicines after you have completely understood and accpet all the possible adverse reactions/side effects.  ? ?Do not drive, operating heavy machinery, perform activities at heights, swimming or participation in water activities or provide baby sitting services if your were admitted for syncope or siezures until you have seen by Primary MD or a Neurologist and advised to do so again. ? ?Do not drive when taking Pain medications.  ? ? ?Do not take more than prescribed Pain, Sleep and Anxiety Medications ? ?Special Instructions: If you have smoked or chewed Tobacco  in the last 2 yrs please stop smoking, stop any regular Alcohol  and or any Recreational drug use. ? ?Wear Seat belts while driving. ? ? ?Please note ? ?You were cared for by a hospitalist during your hospital stay. If you have any questions about your discharge medications or the care you received while you were in the hospital  after you are discharged, you can call the unit and asked to speak with the hospitalist on call if the hospitalist that took care of you is not available. Once you are discharged, your primary care physician will handle any further medical issues. Please note that NO REFILLS for any discharge medications will be authorized once you are discharged, as it is imperative that you return to your primary care physician (or establish a relationship with a primary care physician if you do not have one) for your aftercare needs so that they can reassess your need for medications and monitor your lab values.  ?

## 2021-07-18 NOTE — Progress Notes (Signed)
Dispo note states she is to be d/c on 2liters and follow up outpt ?

## 2021-07-18 NOTE — Discharge Summary (Signed)
Physician Discharge Summary  Lorraine Brandt WNU:[id number] DOB: 09/21/1933 DOA: 07/12/2021  PCP: Reynold Bowen, MD  Admit date: 07/12/2021 Discharge date: 07/18/2021  Admitted From: Home Disposition:  SNF   Recommendations for Outpatient Follow-up:  Continue with c-collar until cleared by neurosurgery On 2 L nasal cannula, wean as tolerated, please keep encouraging to use incentive spirometer Patient is on 1200 cc fluid restrictions Please avoid Lexapro and other SSRIs in the future due to profound hyponatremia Please check CBC, BMP in 3 days Please continue with aspiration precaution especially she is wearing c-collar, feed with assistance and ensure she is sitting up while having any oral or medication intake. Patient will need outpatient follow-up with pulmonary as an outpatient. Please check TSH and free T4 and 4 weeks.    Discharge Condition:Stable CODE STATUS:FULL Diet recommendation: Heart Healthy with 1200 cc fluid restrictions.  Brief/Interim Summary:   Lorraine Brandt  is a 88 y.o. female, with history of osteoarthritis, hypothyroidism, hypertension, polymyalgia rheumatica, dyslipidemia who has been gradually getting weaker over the last few days, also has developed problems laying flat as she gets short of breath, she sustained 2 falls over the last 2 weeks and one of the falls she hurt her neck, she was diagnosed with C6-C7 fracture she saw neurosurgery outpatient and was placed in a c-collar.  She was also placed on a steroid taper.  She continues to get weak and presented to the ER today where she was found to have severe hyponatremia.  Euvolemic hyponatremia due to SIADH : -Presents with generalized weakness, falls, and this is likely due to her hyponatremia . - due to SIADH with urine osmolality of 455, sodium of 77, serum osmolality 243. -Most likely combination of tapering her off steroids, fall with head trauma, and using Lexapro, with possible low solute intake with  normal fluid intake . -She required hypertonic saline initially, then was kept on fluid restrictions, urea tablets and Lasix, this has improved, sodium is 129 today, no indication for Lasix or urea tablets on discharge, but continue with fluid restrictions and monitor BMP closely.  [name] was 129 on discharge.  Currently on fluid restriction, urea and the Lasix . -Continue to hold Lexapro   Acute on chronic diastolic CHF last EF 03%.  -  Echo noted ++ d CHF, BNP high, has orthopnea, SOB on exertion and crackles on exam, chest x-ray also shows fluid overload, she was treated with gentle IV Lasix which did help with her hyponatremia as well, she is euvolemic at time of discharge, no indication for more Lasix, and fluid restriction would be sufficient.     CT noted with chronic interstitial changes  - will require outpatient pulmonary follow-up postdischarge.   Recent fall at home with C7 endplate and C6 vertebral body fracture.  She has seen neurosurgery in the outpatient setting currently had a c-collar which she has to wear at all times till she sees neurosurgery and they take it off.  She was on prednisone taper which has finished during hospital stay, no further prednisone currently.   Hypertension. -Was on Cardizem CD and lisinopril, lisinopril discontinued due to hypokalemia, she is with paroxysmal A-fib, so medication has been adjusted by cardiology, she will be discharged on Cardizem CD 180 mg oral daily and Coreg 12.5 mg p.o. twice daily .  Paroxysmal A-fib  -Patient was noted to have paroxysmal A-fib on telemetry, no diagnosis of such in the past, cardiology input appreciated, at this point she is very high risk  for anticoagulation due to her multiple recent falls, for now continue with aspirin, heart rate control and to follow with cardiology as an outpatient   Osteoarthritis.  Supportive care.  Hospital delirium -Patient with intermittent confusion over last 2 days, no focal deficits,  CT head not acute.   Hypothyroidism -TSH on the lower side at 0.32, possible sick euthyroid, but I will go ahead and decrease her Synthroid from 125 to 100 mcg.   Dyslipidemia.  On statin.  Acute  hypoxic respiratory failure -Saturating 85% on room air at rest, requiring 2 L nasal cannula, this is due to bibasilar atelectasis, please keep encouraging to use incentive spirometer and wean oxygen as tolerated   Hypokalemia -Repleted   Depression.   -Continue to hold Lexapro due to hyponatremia, discontinued on discharge.     Discharge Diagnoses:  Principal Problem:   Hyponatremia Active Problems:   OA (osteoarthritis) of knee   Essential hypertension   Pure hypercholesterolemia   Closed C6 fracture (HCC)   Fall at home, initial encounter   Acute on chronic diastolic CHF (congestive heart failure) (HCC)   Atrial flutter (HCC)   Acute respiratory failure with hypoxia ([place])   Delirium    Discharge Instructions  Discharge Instructions     Diet - low sodium heart healthy   Complete by: As directed    Discharge instructions   Complete by: As directed    Follow with Primary MD Reynold Bowen, MD /SNF physician Get CBC, CMP,  checked  by Primary MD next visit.    Activity: As tolerated with Full fall precautions use walker/cane & assistance as needed   Disposition SNF   Diet: Regular with 1200 cc fluid restriction   On your next visit with your primary care physician please Get Medicines reviewed and adjusted.   Please request your Prim.MD to go over all Hospital Tests and Procedure/Radiological results at the follow up, please get all Hospital records sent to your Prim MD by signing hospital release before you go home.   If you experience worsening of your admission symptoms, develop shortness of breath, life threatening emergency, suicidal or homicidal thoughts you must seek medical attention immediately by calling 911 or calling your MD immediately  if symptoms  less severe.  You Must read complete instructions/literature along with all the possible adverse reactions/side effects for all the Medicines you take and that have been prescribed to you. Take any new Medicines after you have completely understood and accpet all the possible adverse reactions/side effects.   Do not drive, operating heavy machinery, perform activities at heights, swimming or participation in water activities or provide baby sitting services if your were admitted for syncope or siezures until you have seen by Primary MD or a Neurologist and advised to do so again.  Do not drive when taking Pain medications.    Do not take more than prescribed Pain, Sleep and Anxiety Medications  Special Instructions: If you have smoked or chewed Tobacco  in the last 2 yrs please stop smoking, stop any regular Alcohol  and or any Recreational drug use.  Wear Seat belts while driving.   Please note  You were cared for by a hospitalist during your hospital stay. If you have any questions about your discharge medications or the care you received while you were in the hospital after you are discharged, you can call the unit and asked to speak with the hospitalist on call if the hospitalist that took care of you is not  available. Once you are discharged, your primary care physician will handle any further medical issues. Please note that NO REFILLS for any discharge medications will be authorized once you are discharged, as it is imperative that you return to your primary care physician (or establish a relationship with a primary care physician if you do not have one) for your aftercare needs so that they can reassess your need for medications and monitor your lab values.   Increase activity slowly   Complete by: As directed    No wound care   Complete by: As directed       Allergies as of 07/18/2021       Reactions   Oxycodone Other (See Comments)   Makes me crazy   Celebrex [celecoxib]  Itching, Rash   Had itching, burning and rash to it. Likely adverse effect rather than allergic.   Latex Rash        Medication List     STOP taking these medications    escitalopram 5 MG tablet Commonly known as: LEXAPRO   losartan 100 MG tablet Commonly known as: COZAAR   predniSONE 5 MG tablet Commonly known as: DELTASONE       TAKE these medications    acetaminophen 325 MG tablet Commonly known as: TYLENOL Take 2 tablets (650 mg total) by mouth every 6 (six) hours as needed for mild pain (or Fever >/= 101). What changed:  medication strength how much to take reasons to take this   bisacodyl 5 MG EC tablet Commonly known as: DULCOLAX Take 1 tablet (5 mg total) by mouth daily as needed for moderate constipation.   CALCIUM-VITAMIN D PO Take 1 tablet by mouth in the morning and at bedtime.   carvedilol 12.5 MG tablet Commonly known as: COREG Take 1 tablet (12.5 mg total) by mouth 2 (two) times daily with a meal.   cholecalciferol 25 MCG (1000 UNIT) tablet Commonly known as: VITAMIN D3 Take 1,000 Units by mouth daily.   diltiazem 180 MG 24 hr capsule Commonly known as: CARDIZEM CD Take 1 capsule (180 mg total) by mouth every morning. Start taking on: July 19, 2021 What changed:  medication strength how much to take   docusate sodium 100 MG capsule Commonly known as: COLACE Take 100 mg by mouth daily as needed for mild constipation.   feeding supplement Liqd Take 237 mLs by mouth 2 (two) times daily between meals.   FISH OIL PO Take 2,000 mg by mouth daily.   levothyroxine 100 MCG tablet Commonly known as: SYNTHROID Take 1 tablet (100 mcg total) by mouth daily before breakfast. Please take on empty stomach every Monday, Tuesday, Wednesday, Thursday and Friday. What changed:  medication strength how much to take when to take this additional instructions   LUBRICATING EYE DROPS OP Place 1 drop into both eyes daily as needed (dry eyes).    Melatonin 10 MG Tabs Take 10 mg by mouth at bedtime as needed.   omeprazole 20 MG capsule Commonly known as: PRILOSEC Take 20 mg by mouth every morning.   rosuvastatin 20 MG tablet Commonly known as: CRESTOR Take 1 tablet (20 mg total) by mouth daily.   Vitamin C 500 MG Caps Take 500 mg by mouth daily.        Follow-up Information     Loel Dubonnet, NP Follow up.   Specialty: Cardiology Why: Hospital follow-up with Cardiology scheduled for 08/14/2021 at 1:30pm. Please arrive 15 minutes early for check-in. If this date/time  does not work for you, please call our office to reschedule. Contact information: [address] [place] [address] [phone]                Allergies  Allergen Reactions   Oxycodone Other (See Comments)    Makes me crazy   Celebrex [Celecoxib] Itching and Rash    Had itching, burning and rash to it. Likely adverse effect rather than allergic.   Latex Rash    Consultations: Renal cardiology   Procedures/Studies: DG Chest 2 View  Result Date: 07/12/2021 CLINICAL DATA:  Pt brought from home for c/o intermittent AMS, increased general weakness and decreased mobility. Pt has non productive cough. Pt denies any CP but states she has been feeling SOB x a few days Nonsmoker Hx of HTN, cancer, GERD, EXAM: CHEST - 2 VIEW COMPARISON:  04/19/2014 FINDINGS: Progressive development of coarse peripheral interstitial opacities, worse left than right, involving bases more than apices. Heart size upper limits normal.  Tortuous ectatic thoracic aorta. No effusion. Levoscoliosis in the upper thoracic spine with suspected vertebral segmentation anomaly, incompletely characterized. IMPRESSION: 1. Coarse asymmetric interstitial opacities left greater than right, suggesting progressive chronic interstitial lung disease. Cannot exclude superimposed interstitial infiltrate or asymmetric edema. Electronically Signed   By: Lucrezia Europe M.D.   On: 07/12/2021  10:10   DG Lumbar Spine Complete  Result Date: 07/03/2021 CLINICAL DATA:  Status post fall with lower back pain. EXAM: LUMBAR SPINE - COMPLETE 4+ VIEW COMPARISON:  February 25, 2021 FINDINGS: There is no evidence of acute lumbar spine fracture. Mild dextroscoliosis is seen with stable grade 1 anterolisthesis of the L4 vertebral body on L5. Mild to moderate severity multilevel endplate sclerosis is seen. Stable marked severity intervertebral disc space narrowing is noted at the level of L4-L5. IMPRESSION: 1. No evidence of acute lumbar spine fracture. 2. Stable marked severity degenerative disc disease at L4-L5 with stable grade 1 anterolisthesis of the L4 vertebral body. Electronically Signed   By: Virgina Norfolk M.D.   On: 07/03/2021 02:47   CT HEAD WO CONTRAST (5MM)  Result Date: 07/18/2021 CLINICAL DATA:  Altered mental status.  Fall. EXAM: CT HEAD WITHOUT CONTRAST TECHNIQUE: Contiguous axial images were obtained from the base of the skull through the vertex without intravenous contrast. RADIATION DOSE REDUCTION: This exam was performed according to the departmental dose-optimization program which includes automated exposure control, adjustment of the mA and/or kV according to patient size and/or use of iterative reconstruction technique. COMPARISON:  07/04/2021 FINDINGS: Brain: Expected cerebral and cerebellar atrophy for age. Moderate low density in the periventricular white matter likely related to small vessel disease. No mass lesion, hemorrhage, hydrocephalus, acute infarct, intra-axial, or extra-axial fluid collection. Vascular: No hyperdense vessel or unexpected calcification. Skull: Decrease in left parietooccipital scalp soft tissue swelling. No skull fracture. Sinuses/Orbits: Normal imaged portions of the orbits and globes. Clear paranasal sinuses and mastoid air cells. Other: None. IMPRESSION: 1.  No acute intracranial abnormality. 2.  Cerebral atrophy and small vessel ischemic change. 3.  Improvement in scalp soft tissue swelling. Electronically Signed   By: Abigail Miyamoto M.D.   On: 07/18/2021 13:10   CT Head Wo Contrast  Result Date: 07/04/2021 CLINICAL DATA:  Trauma EXAM: CT HEAD WITHOUT CONTRAST TECHNIQUE: Contiguous axial images were obtained from the base of the skull through the vertex without intravenous contrast. RADIATION DOSE REDUCTION: This exam was performed according to the departmental dose-optimization program which includes automated exposure control, adjustment of the mA and/or kV  according to patient size and/or use of iterative reconstruction technique. COMPARISON:  07/03/2021 FINDINGS: Brain: No acute intracranial findings are seen. There are no signs of intracranial bleeding. There are no epidural or subdural fluid collections. Cortical sulci are prominent. There is decreased density in the periventricular white matter. Vascular: Unremarkable. Skull: No fracture is seen in the calvarium. There is subcutaneous contusion/hematoma in the posterior parietal scalp. Sinuses/Orbits: There are no air-fluid levels. Other: None IMPRESSION: No acute intracranial findings are seen in noncontrast CT brain. There is subcutaneous contusion/hematoma in the parietal scalp. No fracture is seen in the calvarium. Electronically Signed   By: Elmer Picker M.D.   On: 07/04/2021 17:47   CT Head Wo Contrast  Result Date: 07/03/2021 CLINICAL DATA:  Recent slip and fall with headaches and neck pain, initial encounter EXAM: CT HEAD WITHOUT CONTRAST CT CERVICAL SPINE WITHOUT CONTRAST TECHNIQUE: Multidetector CT imaging of the head and cervical spine was performed following the standard protocol without intravenous contrast. Multiplanar CT image reconstructions of the cervical spine were also generated. RADIATION DOSE REDUCTION: This exam was performed according to the departmental dose-optimization program which includes automated exposure control, adjustment of the mA and/or kV according to  patient size and/or use of iterative reconstruction technique. COMPARISON:  04/18/2019 FINDINGS: CT HEAD FINDINGS Brain: No evidence of acute infarction, hemorrhage, hydrocephalus, extra-axial collection or mass lesion/mass effect. Chronic atrophic and ischemic changes are noted. Vascular: No hyperdense vessel or unexpected calcification. Skull: Normal. Negative for fracture or focal lesion. Sinuses/Orbits: No acute finding. Other: None. CT CERVICAL SPINE FINDINGS Alignment: Loss of the normal cervical lordosis is noted. Anterolisthesis of C4 on C5 is seen felt to be degenerative in nature. Skull base and vertebrae: 7 cervical segments are well visualized. Vertebral body height is well maintained. At least partial fusion at C5-6 is noted. Anterolisthesis of C4 on C5 is noted of a degenerative nature. No acute fracture or acute facet abnormality is noted. Multilevel facet hypertrophic changes are seen. Significant scoliosis in the upper thoracic spine is noted concave to the left. There are findings suspicious for hemi vertebra between C6 and T1 vertebra on the left. Soft tissues and spinal canal: Surrounding soft tissue structures show vascular calcifications. Upper chest: Visualized lung apices are within normal limits. Other: None IMPRESSION: CT of the head: Chronic atrophic and ischemic changes. CT of the cervical spine: Multilevel degenerative change. No acute abnormality noted. 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 Electronically Signed   By: Inez Catalina M.D.   On: 07/03/2021 04:02   CT CHEST WO CONTRAST  Result Date: 07/14/2021 CLINICAL DATA:  Cough for greater than 8 weeks. Progressive weakness. EXAM: CT CHEST WITHOUT CONTRAST TECHNIQUE: Multidetector CT imaging of the chest was performed following the standard protocol without IV contrast. RADIATION DOSE REDUCTION: This exam was performed according to the departmental dose-optimization program which includes automated exposure control, adjustment  of the mA and/or kV according to patient size and/or use of iterative reconstruction technique. COMPARISON:  None. FINDINGS: Cardiovascular: Normal heart size. No pericardial effusion. Aortic atherosclerosis and coronary artery calcifications. Mediastinum/Nodes: No enlarged mediastinal or axillary lymph nodes. Thyroid gland, trachea, and esophagus demonstrate no significant findings. Lungs/Pleura: No pleural effusion, airspace consolidation, atelectasis, or pneumothorax. No interstitial edema. Signs of chronic interstitial lung disease identified with peripheral predominant subpleural reticulation, traction bronchiectasis and multifocal patchy areas of ground-glass attenuation. Thickening of the peribronchovascular interstitium is also identified. No frank honeycombing noted. Calcified granuloma is noted within the left upper lobe.  Upper Abdomen: No acute findings within the imaged portions of the upper abdomen. Subcapsular cyst within medial segment of left hepatic lobe measures 9 mm. Aortic atherosclerotic calcifications. Musculoskeletal: Thoracolumbar scoliosis deformity with mild degenerative disc disease. No acute or suspicious osseous findings. IMPRESSION: 1. No acute cardiopulmonary abnormality. 2. Signs of chronic interstitial lung disease including peripheral subpleural reticulation, traction bronchiectasis and multifocal areas of ground-glass attenuation. Recommend follow-up imaging with high-resolution CT of the chest in 6 months to assess for temporal change in the appearance of the lungs. 3. Thoracolumbar scoliosis and degenerative disc disease. 4. Aortic Atherosclerosis (ICD10-I70.0). Electronically Signed   By: Kerby Moors M.D.   On: 07/14/2021 14:38   CT Cervical Spine Wo Contrast  Addendum Date: 07/04/2021   ADDENDUM REPORT: 07/04/2021 17:44 ADDENDUM: Imaging findings were relayed to patient's provider Theodis Blaze by telephone call. Electronically Signed   By: Elmer Picker M.D.   On:  07/04/2021 17:44   Result Date: 07/04/2021 CLINICAL DATA:  Trauma EXAM: CT CERVICAL SPINE WITHOUT CONTRAST TECHNIQUE: Multidetector CT imaging of the cervical spine was performed without intravenous contrast. Multiplanar CT image reconstructions were also generated. RADIATION DOSE REDUCTION: This exam was performed according to the departmental dose-optimization program which includes automated exposure control, adjustment of the mA and/or kV according to patient size and/or use of iterative reconstruction technique. COMPARISON:  07/03/2021 FINDINGS: Alignment: There is minimal anterolisthesis at C4-C5 level suggesting previous ligament injury and facet degeneration. There is congenital partial fusion of bodies of C5 and C6 vertebrae. There is a radiolucent line in the right lamina of C6 vertebra. There is radiolucent line in the left anterior superior C7 vertebra. There is congenital partial fusion of bodies of T1 and T2 vertebrae. 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 Skull base and vertebrae: Degenerative changes are noted with bony spurs and facet hypertrophy at multiple levels in the cervical spine. Soft tissues and spinal canal: There is no significant central spinal stenosis. Disc levels: There is encroachment of neural foramina from C2-C7 levels. Upper chest: Unremarkable. Other: Thyroid is not seen suggesting atrophy or previous removal. IMPRESSION: There is radiolucent line in the right lamina of C6 vertebra. There is radiolucent line in the anterior superior left side body C7 vertebra. Possibility of recent fractures is not excluded. Follow-up MRI as clinically warranted should be considered. Cervical spondylosis with encroachment of neural foramina at multiple levels. Congenital deformity is seen in the lower cervical and upper thoracic spine. Other findings as described in the body of the report. Electronically Signed: By: Elmer Picker M.D. On: 07/04/2021  17:40   CT Cervical Spine Wo Contrast  Result Date: 07/03/2021 CLINICAL DATA:  Recent slip and fall with headaches and neck pain, initial encounter EXAM: CT HEAD WITHOUT CONTRAST CT CERVICAL SPINE WITHOUT CONTRAST TECHNIQUE: Multidetector CT imaging of the head and cervical spine was performed following the standard protocol without intravenous contrast. Multiplanar CT image reconstructions of the cervical spine were also generated. RADIATION DOSE REDUCTION: This exam was performed according to the departmental dose-optimization program which includes automated exposure control, adjustment of the mA and/or kV according to patient size and/or use of iterative reconstruction technique. COMPARISON:  04/18/2019 FINDINGS: CT HEAD FINDINGS Brain: No evidence of acute infarction, hemorrhage, hydrocephalus, extra-axial collection or mass lesion/mass effect. Chronic atrophic and ischemic changes are noted. Vascular: No hyperdense vessel or unexpected calcification. Skull: Normal. Negative for fracture or focal lesion. Sinuses/Orbits: No acute finding. Other: None. CT CERVICAL SPINE  FINDINGS Alignment: Loss of the normal cervical lordosis is noted. Anterolisthesis of C4 on C5 is seen felt to be degenerative in nature. Skull base and vertebrae: 7 cervical segments are well visualized. Vertebral body height is well maintained. At least partial fusion at C5-6 is noted. Anterolisthesis of C4 on C5 is noted of a degenerative nature. No acute fracture or acute facet abnormality is noted. Multilevel facet hypertrophic changes are seen. Significant scoliosis in the upper thoracic spine is noted concave to the left. There are findings suspicious for hemi vertebra between C6 and T1 vertebra on the left. Soft tissues and spinal canal: Surrounding soft tissue structures show vascular calcifications. Upper chest: Visualized lung apices are within normal limits. Other: None IMPRESSION: CT of the head: Chronic atrophic and ischemic  changes. CT of the cervical spine: Multilevel degenerative change. No acute abnormality noted. 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 Electronically Signed   By: Inez Catalina M.D.   On: 07/03/2021 04:02   MR CERVICAL SPINE WO CONTRAST  Result Date: 07/10/2021 CLINICAL DATA:  Cervicalgia EXAM: MRI CERVICAL SPINE WITHOUT CONTRAST TECHNIQUE: Multiplanar, multisequence MR imaging of the cervical spine was performed. No intravenous contrast was administered. COMPARISON:  Cervical spine CT 07/04/2021 FINDINGS: Alignment: Segmentation anomalies at the cervicothoracic junction contribute to severe levoscoliosis of the upper thoracic spine. Grade 1 anterolisthesis at C3-4 and C4-5. Vertebrae: There is edema at the superior C7 endplate in the region of the fracture identified on the earlier CT. There is also edema at the site of the C6 spinous process fracture. Assessment of the bones is otherwise limited by the degree of motion. Cord: Assessment severely limited by motion. Posterior Fossa, vertebral arteries, paraspinal tissues: Negative. Disc levels: C1-2: Unremarkable. C2-3: No disc herniation. There is no spinal canal stenosis. No neural foraminal stenosis. C3-4: Facet arthrosis, left-greater-than-right, and left foraminal disc protrusion. There is no spinal canal stenosis. Severe left neural foraminal stenosis. C4-5: Severe left facet arthrosis. There is no spinal canal stenosis. Severe left neural foraminal stenosis. C5-6: No disc herniation, spinal canal stenosis or neural impingement C6-7: No disc herniation, spinal canal stenosis or neural impingement . C7-T1: No disc herniation, spinal canal stenosis or neural impingement IMPRESSION: 1. Severely motion degraded study. 2. Edema at the site of the superior C7 endplate fracture and C6 spinous process fracture. 3. Severe left C3-4 and C4-5 neural foraminal stenosis secondary to combination of facet arthrosis and left foraminal disc protrusion. 4. Cervicothoracic  segmentation anomalies with marked scoliosis. Electronically Signed   By: Ulyses Jarred M.D.   On: 07/10/2021 21:34   DG Chest Port 1 View  Result Date: 07/13/2021 CLINICAL DATA:  88 year old female with history of shortness of breath. EXAM: PORTABLE CHEST 1 VIEW COMPARISON:  Chest x-ray 07/12/2021. FINDINGS: Lung volumes are low. No consolidative airspace disease. No pleural effusions. Widespread interstitial prominence, most evident throughout the mid to lower lungs bilaterally, similar to the prior examination no pneumothorax. No evidence of pulmonary edema. Heart size is normal. Numerous calcified mediastinal and bilateral hilar lymph nodes are incidentally noted. Upper mediastinal contours are distorted by patient positioning. Atherosclerosis in the thoracic aorta. IMPRESSION: 1. Low lung volumes with diffuse interstitial prominence, most evident throughout the mid to lower lungs bilaterally, concerning for interstitial lung disease. Further evaluation with nonemergent high-resolution chest CT is recommended in the near future to better evaluate these findings. 2. Aortic atherosclerosis. Electronically Signed   By: Vinnie Langton M.D.   On: 07/13/2021 06:36   ECHOCARDIOGRAM  COMPLETE  Result Date: 07/12/2021    ECHOCARDIOGRAM REPORT   Patient Name:   LASHANDRA ARAUZ Date of Exam: 07/12/2021 Medical Rec #:  [id number]           Height:       61.0 in Accession #:    [id number]          Weight:       125.0 lb Date of Birth:  03/06/1934          BSA:          1.547 m Patient Age:    88 years            BP:           136/84 mmHg Patient Gender: F                   HR:           71 bpm. Exam Location:  Inpatient Procedure: 2D Echo, Cardiac Doppler and Color Doppler Indications:    CHF  History:        Patient has prior history of Echocardiogram examinations, most                 recent 07/28/2019. CHF; Risk Factors:Hypertension and                 Dyslipidemia.  Sonographer:    Luisa Hart RDCS Referring  Phys: [id number] [place]  1. Prominent basal septal hypertrophy and hypertrophied papilary muscles no mid cavitary gradient recoreded No SAM . Left ventricular ejection fraction, by estimation, is 60 to 65%. The left ventricle has normal function. The left ventricle has no regional wall motion abnormalities. There is severe asymmetric left ventricular hypertrophy of the basal and septal segments. Left ventricular diastolic parameters are consistent with Grade II diastolic dysfunction (pseudonormalization). Elevated left ventricular end-diastolic pressure.  2. Right ventricular systolic function is normal. The right ventricular size is normal.  3. The pericardial effusion is posterior to the left ventricle.  4. The mitral valve is abnormal. Trivial mitral valve regurgitation. No evidence of mitral stenosis.  5. Small gradient across AV but morphologically opens well . The aortic valve is tricuspid. There is mild calcification of the aortic valve. There is mild thickening of the aortic valve. Aortic valve regurgitation is mild. Aortic valve sclerosis is present, with no evidence of aortic valve stenosis.  6. The inferior vena cava is normal in size with greater than 50% respiratory variability, suggesting right atrial pressure of 3 mmHg. FINDINGS  Left Ventricle: Prominent basal septal hypertrophy and hypertrophied papilary muscles no mid cavitary gradient recoreded No SAM. Left ventricular ejection fraction, by estimation, is 60 to 65%. The left ventricle has normal function. The left ventricle has no regional wall motion abnormalities. The left ventricular internal cavity size was normal in size. There is severe asymmetric left ventricular hypertrophy of the basal and septal segments. Left ventricular diastolic parameters are consistent with Grade II diastolic dysfunction (pseudonormalization). Elevated left ventricular end-diastolic pressure. Right Ventricle: The right ventricular size is normal. No  increase in right ventricular wall thickness. Right ventricular systolic function is normal. Left Atrium: Left atrial size was normal in size. Right Atrium: Right atrial size was normal in size. Pericardium: Trivial pericardial effusion is present. The pericardial effusion is posterior to the left ventricle. Mitral Valve: The mitral valve is abnormal. There is mild thickening of the mitral valve leaflet(s). There is moderate calcification of the mitral valve  leaflet(s). Mild mitral annular calcification. Trivial mitral valve regurgitation. No evidence of mitral valve stenosis. MV peak gradient, 4.4 mmHg. The mean mitral valve gradient is 2.0 mmHg. Tricuspid Valve: The tricuspid valve is normal in structure. Tricuspid valve regurgitation is trivial. No evidence of tricuspid stenosis. Aortic Valve: Small gradient across AV but morphologically opens well. The aortic valve is tricuspid. There is mild calcification of the aortic valve. There is mild thickening of the aortic valve. Aortic valve regurgitation is mild. Aortic regurgitation PHT measures 565 msec. Aortic valve sclerosis is present, with no evidence of aortic valve stenosis. Aortic valve mean gradient measures 7.0 mmHg. Aortic valve peak gradient measures 13.3 mmHg. Aortic valve area, by VTI measures 1.78 cm. Pulmonic Valve: The pulmonic valve was normal in structure. Pulmonic valve regurgitation is not visualized. No evidence of pulmonic stenosis. Aorta: The aortic root is normal in size and structure. Venous: The inferior vena cava is normal in size with greater than 50% respiratory variability, suggesting right atrial pressure of 3 mmHg. IAS/Shunts: No atrial level shunt detected by color flow Doppler.  LEFT VENTRICLE PLAX 2D LVIDd:         3.00 cm     Diastology LVIDs:         1.40 cm     LV e' medial:    4.43 cm/s LV PW:         1.00 cm     LV E/e' medial:  17.2 LV IVS:        1.80 cm     LV e' lateral:   6.30 cm/s LVOT diam:     1.80 cm     LV E/e'  lateral: 12.1 LV SV:         57 LV SV Index:   37 LVOT Area:     2.54 cm  LV Volumes (MOD) LV vol d, MOD A2C: 29.0 ml LV vol d, MOD A4C: 22.0 ml LV vol s, MOD A2C: 7.9 ml LV vol s, MOD A4C: 4.8 ml LV SV MOD A2C:     21.1 ml LV SV MOD A4C:     22.0 ml LV SV MOD BP:      20.8 ml RIGHT VENTRICLE RV Basal diam:  2.60 cm RV Mid diam:    2.60 cm RV S prime:     13.40 cm/s TAPSE (M-mode): 1.6 cm LEFT ATRIUM             Index        RIGHT ATRIUM          Index LA diam:        3.20 cm 2.07 cm/m   RA Area:     8.13 cm LA Vol (A2C):   40.2 ml 25.99 ml/m  RA Volume:   12.60 ml 8.15 ml/m LA Vol (A4C):   39.1 ml 25.28 ml/m LA Biplane Vol: 41.9 ml 27.09 ml/m  AORTIC VALVE                     PULMONIC VALVE AV Area (Vmax):    1.76 cm      PV Vmax:       1.00 m/s AV Area (Vmean):   1.76 cm      PV Vmean:      65.550 cm/s AV Area (VTI):     1.78 cm      PV VTI:        0.196 m AV Vmax:  182.50 cm/s   PV Peak grad:  4.0 mmHg AV Vmean:          120.000 cm/s  PV Mean grad:  2.5 mmHg AV VTI:            0.322 m AV Peak Grad:      13.3 mmHg AV Mean Grad:      7.0 mmHg LVOT Vmax:         126.00 cm/s LVOT Vmean:        83.200 cm/s LVOT VTI:          0.225 m LVOT/AV VTI ratio: 0.70 AI PHT:            565 msec  AORTA Ao Root diam: 2.60 cm Ao Asc diam:  3.20 cm MITRAL VALVE               TRICUSPID VALVE MV Area (PHT): 3.42 cm    TR Peak grad:   33.9 mmHg MV Area VTI:   1.90 cm    TR Vmax:        291.00 cm/s MV Peak grad:  4.4 mmHg MV Mean grad:  2.0 mmHg    SHUNTS MV Vmax:       1.05 m/s    Systemic VTI:  0.22 m MV Vmean:      60.5 cm/s   Systemic Diam: 1.80 cm MV Decel Time: 222 msec MV E velocity: 76.20 cm/s MV A velocity: 82.50 cm/s MV E/A ratio:  0.92 Jenkins Rouge MD Electronically signed by Jenkins Rouge MD Signature Date/Time: 07/12/2021/2:40:27 PM    Final    [place] EKG SITE RITE  Result Date: 07/17/2021 If Site Rite image not attached, placement could not be confirmed due to current cardiac rhythm.      Subjective:  Denies any chest pain, shortness of breath, no nausea, no vomiting Discharge Exam: Vitals:   07/18/21 0741 07/18/21 1157  BP: (!) 148/67 104/68  Pulse: 91 88  Resp: 19 20  Temp: (!) 97.5 F (36.4 C) 98.1 F (36.7 C)  SpO2: (!) 87% 90%   Vitals:   07/17/21 2321 07/18/21 0311 07/18/21 0741 07/18/21 1157  BP: 108/66 (!) 160/89 (!) 148/67 104/68  Pulse: 76 91 91 88  Resp: '20 14 19 20  '$ Temp: 98.1 F (36.7 C) 97.9 F (36.6 C) (!) 97.5 F (36.4 C) 98.1 F (36.7 C)  TempSrc: Axillary Axillary Oral Oral  SpO2: 93% 95% (!) 87% 90%  Weight:      Height:        General: Pt is alert, awake, frail, wearing c-collar, she is with intermittent confusion but still answering questions appropriately and follow commands.   Cardiovascular: RRR, S1/S2 +, no rubs, no gallops Respiratory: CTA bilaterally, no wheezing, no rhonchi Abdominal: Soft, NT, ND, bowel sounds + Extremities: no edema, no cyanosis    The results of significant diagnostics from this hospitalization (including imaging, microbiology, ancillary and laboratory) are listed below for reference.     Microbiology: Recent Results (from the past 240 hour(s))  Resp Panel by RT-PCR (Flu A&B, Covid) Nasopharyngeal Swab     Status: None   Collection Time: 07/12/21  9:42 AM   Specimen: Nasopharyngeal Swab; Nasopharyngeal(NP) swabs in vial transport medium  Result Value Ref Range Status   SARS Coronavirus 2 by RT PCR NEGATIVE NEGATIVE Final    Comment: (NOTE) SARS-CoV-2 target nucleic acids are NOT DETECTED.  The SARS-CoV-2 RNA is generally detectable in upper respiratory specimens during the acute phase of  infection. The lowest concentration of SARS-CoV-2 viral copies this assay can detect is 138 copies/mL. A negative result does not preclude SARS-Cov-2 infection and should not be used as the sole basis for treatment or other patient management decisions. A negative result may occur with  improper specimen  collection/handling, submission of specimen other than nasopharyngeal swab, presence of viral mutation(s) within the areas targeted by this assay, and inadequate number of viral copies(<138 copies/mL). A negative result must be combined with clinical observations, patient history, and epidemiological information. The expected result is Negative.  Fact Sheet for Patients:  EntrepreneurPulse.com.au  Fact Sheet for Healthcare Providers:  IncredibleEmployment.be  This test is no t yet approved or cleared by the [place] FDA and  has been authorized for detection and/or diagnosis of SARS-CoV-2 by FDA under an Emergency Use Authorization (EUA). This EUA will remain  in effect (meaning this test can be used) for the duration of the COVID-19 declaration under Section 564(b)(1) of the Act, 21 U.S.C.section 360bbb-3(b)(1), unless the authorization is terminated  or revoked sooner.       Influenza A by PCR NEGATIVE NEGATIVE Final   Influenza B by PCR NEGATIVE NEGATIVE Final    Comment: (NOTE) The Xpert Xpress SARS-CoV-2/FLU/RSV plus assay is intended as an aid in the diagnosis of influenza from Nasopharyngeal swab specimens and should not be used as a sole basis for treatment. Nasal washings and aspirates are unacceptable for Xpert Xpress SARS-CoV-2/FLU/RSV testing.  Fact Sheet for Patients: EntrepreneurPulse.com.au  Fact Sheet for Healthcare Providers: IncredibleEmployment.be  This test is not yet approved or cleared by the [place] FDA and has been authorized for detection and/or diagnosis of SARS-CoV-2 by FDA under an Emergency Use Authorization (EUA). This EUA will remain in effect (meaning this test can be used) for the duration of the COVID-19 declaration under Section 564(b)(1) of the Act, 21 U.S.C. section 360bbb-3(b)(1), unless the authorization is terminated or revoked.  Performed at [facility], [place] [address], [place], [place] [address]      Labs: BNP (last 3 results) Recent Labs    07/14/21 0420 07/14/21 2359 07/16/21 0352  BNP 136.0* 112.0* 58.5   Basic Metabolic Panel: Recent Labs  Lab 07/13/21 0201 07/13/21 1440 07/14/21 0420 07/14/21 0735 07/14/21 2359 07/15/21 0751 07/16/21 0352 07/16/21 0742 07/16/21 1159 07/16/21 1613 07/17/21 0428 07/18/21 0250  NA 116*   116*   < > 120*   < > 123*   < > 126* 124* 126* 126* 127* 129*  K 3.2*  --  4.1  --  3.7  --  3.9  --   --  3.6 3.2* 5.2*  CL 83*  --  89*  --  88*  --  89*  --   --  91* 88* 92*  CO2 27  --  25  --  27  --  27  --   --  '25 30 28  '$ GLUCOSE 91  --  100*  --  114*  --  99  --   --  145* 106* 105*  BUN 12  --  12  --  45*  --  40*  --   --  61* 49* 50*  CREATININE 0.40*  --  0.48  --  0.59  --  0.56  --   --  1.04* 0.68 0.56  CALCIUM 7.7*  --  7.6*  --  8.2*  --  8.9  --   --  8.9 8.9 9.6  MG 1.7  --  2.2  --  2.0  --  2.0  --   --   --   --  2.2  PHOS  --   --   --   --   --   --   --   --   --   --  4.8*  --    < > = values in this interval not displayed.   Liver Function Tests: Recent Labs  Lab 07/12/21 0945 07/13/21 0201 07/14/21 0420 07/14/21 2359 07/16/21 0352  AST '27 19 17 24 26  '$ ALT '25 25 25 29 '$ 32  ALKPHOS 73 79 86 92 93  BILITOT 1.3* 1.0 0.5 0.5 1.1  PROT 5.3* 4.9* 5.0* 5.1* 5.4*  ALBUMIN 3.3* 2.9* 2.8* 2.8* 2.9*   No results for input(s): LIPASE, AMYLASE in the last 168 hours. No results for input(s): AMMONIA in the last 168 hours. CBC: Recent Labs  Lab 07/12/21 0945 07/12/21 1005 07/13/21 0201 07/14/21 0420 07/14/21 2359 07/16/21 0352 07/17/21 0428 07/18/21 0250  WBC 15.5*  --  14.3* 12.2* 11.0* 11.6* 12.7* 11.8*  NEUTROABS 12.5*  --  11.9* 10.7* 9.5* 9.8*  --   --   HGB 13.5   < > 12.6 12.0 11.8* 12.7 13.2 12.3  HCT 36.5   < > 33.9* 32.4* 32.6* 34.5* 35.8* 35.3*  MCV 89.5  --  89.4 89.5 91.8 90.8 92.3 94.4  PLT 268  --  254 240 235 249 289 267   < > =  values in this interval not displayed.   Cardiac Enzymes: No results for input(s): CKTOTAL, CKMB, CKMBINDEX, TROPONINI in the last 168 hours. BNP: Invalid input(s): POCBNP CBG: Recent Labs  Lab 07/16/21 1422  GLUCAP 145*   D-Dimer No results for input(s): DDIMER in the last 72 hours. Hgb A1c No results for input(s): HGBA1C in the last 72 hours. Lipid Profile No results for input(s): CHOL, HDL, LDLCALC, TRIG, CHOLHDL, LDLDIRECT in the last 72 hours. Thyroid function studies No results for input(s): TSH, T4TOTAL, T3FREE, THYROIDAB in the last 72 hours.  Invalid input(s): FREET3 Anemia work up No results for input(s): VITAMINB12, FOLATE, FERRITIN, TIBC, IRON, RETICCTPCT in the last 72 hours. Urinalysis    Component Value Date/Time   COLORURINE YELLOW 07/12/2021 1005   APPEARANCEUR CLEAR 07/12/2021 1005   LABSPEC 1.015 07/12/2021 1005   PHURINE 7.0 07/12/2021 1005   GLUCOSEU NEGATIVE 07/12/2021 1005   HGBUR NEGATIVE 07/12/2021 1005   BILIRUBINUR NEGATIVE 07/12/2021 1005   KETONESUR NEGATIVE 07/12/2021 1005   PROTEINUR NEGATIVE 07/12/2021 1005   UROBILINOGEN 0.2 04/26/2014 2124   NITRITE NEGATIVE 07/12/2021 1005   LEUKOCYTESUR NEGATIVE 07/12/2021 1005   Sepsis Labs Invalid input(s): PROCALCITONIN,  WBC,  LACTICIDVEN Microbiology Recent Results (from the past 240 hour(s))  Resp Panel by RT-PCR (Flu A&B, Covid) Nasopharyngeal Swab     Status: None   Collection Time: 07/12/21  9:42 AM   Specimen: Nasopharyngeal Swab; Nasopharyngeal(NP) swabs in vial transport medium  Result Value Ref Range Status   SARS Coronavirus 2 by RT PCR NEGATIVE NEGATIVE Final    Comment: (NOTE) SARS-CoV-2 target nucleic acids are NOT DETECTED.  The SARS-CoV-2 RNA is generally detectable in upper respiratory specimens during the acute phase of infection. The lowest concentration of SARS-CoV-2 viral copies this assay can detect is 138 copies/mL. A negative result does not preclude  SARS-Cov-2 infection and should not be used as the sole basis for treatment or other patient management decisions. A negative  result may occur with  improper specimen collection/handling, submission of specimen other than nasopharyngeal swab, presence of viral mutation(s) within the areas targeted by this assay, and inadequate number of viral copies(<138 copies/mL). A negative result must be combined with clinical observations, patient history, and epidemiological information. The expected result is Negative.  Fact Sheet for Patients:  EntrepreneurPulse.com.au  Fact Sheet for Healthcare Providers:  IncredibleEmployment.be  This test is no t yet approved or cleared by the [place] FDA and  has been authorized for detection and/or diagnosis of SARS-CoV-2 by FDA under an Emergency Use Authorization (EUA). This EUA will remain  in effect (meaning this test can be used) for the duration of the COVID-19 declaration under Section 564(b)(1) of the Act, 21 U.S.C.section 360bbb-3(b)(1), unless the authorization is terminated  or revoked sooner.       Influenza A by PCR NEGATIVE NEGATIVE Final   Influenza B by PCR NEGATIVE NEGATIVE Final    Comment: (NOTE) The Xpert Xpress SARS-CoV-2/FLU/RSV plus assay is intended as an aid in the diagnosis of influenza from Nasopharyngeal swab specimens and should not be used as a sole basis for treatment. Nasal washings and aspirates are unacceptable for Xpert Xpress SARS-CoV-2/FLU/RSV testing.  Fact Sheet for Patients: EntrepreneurPulse.com.au  Fact Sheet for Healthcare Providers: IncredibleEmployment.be  This test is not yet approved or cleared by the [place] FDA and has been authorized for detection and/or diagnosis of SARS-CoV-2 by FDA under an Emergency Use Authorization (EUA). This EUA will remain in effect (meaning this test can be used) for the duration of  the COVID-19 declaration under Section 564(b)(1) of the Act, 21 U.S.C. section 360bbb-3(b)(1), unless the authorization is terminated or revoked.  Performed at [facility], [place] [address] [place], [place] [address]      Time coordinating discharge: Over 30 minutes  SIGNED:   Phillips Climes, MD  [facility] 07/18/2021, 1:58 PM Pager   If 7PM-7AM, please contact night-coverage [email or web address] Password [email or web address]

## 2021-07-18 NOTE — Progress Notes (Signed)
Patient ID: Lorraine Brandt, female   DOB: [birth year], 88 y.o.   MRN: [id number] ? ?Report called to [facility], taken by Ermalinda Barrios LPN. No further questions at this time.  ? ?Haydee Salter, RN ? ?

## 2021-07-18 NOTE — Plan of Care (Signed)
Patient ID: Lorraine Brandt, female   DOB: [birth year], 88 y.o.   MRN: [id number] ? ?Problem: Education: ?Goal: Knowledge of General Education information will improve ?Description: Including pain rating scale, medication(s)/side effects and non-pharmacologic comfort measures ?07/18/2021 1410 by Haydee Salter, RN ?Outcome: Adequate for Discharge ?07/18/2021 1410 by Haydee Salter, RN ?Outcome: Progressing ?  ?Problem: Health Behavior/Discharge Planning: ?Goal: Ability to manage health-related needs will improve ?07/18/2021 1410 by Haydee Salter, RN ?Outcome: Adequate for Discharge ?07/18/2021 1410 by Haydee Salter, RN ?Outcome: Progressing ?  ?Problem: Clinical Measurements: ?Goal: Ability to maintain clinical measurements within normal limits will improve ?07/18/2021 1410 by Haydee Salter, RN ?Outcome: Adequate for Discharge ?07/18/2021 1410 by Haydee Salter, RN ?Outcome: Progressing ?Goal: Will remain free from infection ?07/18/2021 1410 by Haydee Salter, RN ?Outcome: Adequate for Discharge ?07/18/2021 1410 by Haydee Salter, RN ?Outcome: Progressing ?Goal: Diagnostic test results will improve ?07/18/2021 1410 by Haydee Salter, RN ?Outcome: Adequate for Discharge ?07/18/2021 1410 by Haydee Salter, RN ?Outcome: Progressing ?Goal: Respiratory complications will improve ?07/18/2021 1410 by Haydee Salter, RN ?Outcome: Adequate for Discharge ?07/18/2021 1410 by Haydee Salter, RN ?Outcome: Progressing ?Goal: Cardiovascular complication will be avoided ?07/18/2021 1410 by Haydee Salter, RN ?Outcome: Adequate for Discharge ?07/18/2021 1410 by Haydee Salter, RN ?Outcome: Progressing ?  ?Problem: Activity: ?Goal: Risk for activity intolerance will decrease ?07/18/2021 1410 by Haydee Salter, RN ?Outcome: Adequate for Discharge ?07/18/2021 1410 by Haydee Salter, RN ?Outcome: Progressing ?  ?Problem: Nutrition: ?Goal: Adequate nutrition will be maintained ?07/18/2021 1410 by Haydee Salter,  RN ?Outcome: Adequate for Discharge ?07/18/2021 1410 by Haydee Salter, RN ?Outcome: Progressing ?  ?Problem: Coping: ?Goal: Level of anxiety will decrease ?07/18/2021 1410 by Haydee Salter, RN ?Outcome: Adequate for Discharge ?07/18/2021 1410 by Haydee Salter, RN ?Outcome: Progressing ?  ?Problem: Elimination: ?Goal: Will not experience complications related to bowel motility ?07/18/2021 1410 by Haydee Salter, RN ?Outcome: Adequate for Discharge ?07/18/2021 1410 by Haydee Salter, RN ?Outcome: Progressing ?Goal: Will not experience complications related to urinary retention ?07/18/2021 1410 by Haydee Salter, RN ?Outcome: Adequate for Discharge ?07/18/2021 1410 by Haydee Salter, RN ?Outcome: Progressing ?  ?Problem: Pain Managment: ?Goal: General experience of comfort will improve ?07/18/2021 1410 by Haydee Salter, RN ?Outcome: Adequate for Discharge ?07/18/2021 1410 by Haydee Salter, RN ?Outcome: Progressing ?  ?Problem: Safety: ?Goal: Ability to remain free from injury will improve ?07/18/2021 1410 by Haydee Salter, RN ?Outcome: Adequate for Discharge ?07/18/2021 1410 by Haydee Salter, RN ?Outcome: Progressing ?  ?Problem: Skin Integrity: ?Goal: Risk for impaired skin integrity will decrease ?07/18/2021 1410 by Haydee Salter, RN ?Outcome: Adequate for Discharge ?07/18/2021 1410 by Haydee Salter, RN ?Outcome: Progressing ?  ? ?Haydee Salter, RN ? ?

## 2021-07-18 NOTE — TOC Transition Note (Signed)
Transition of Care (TOC) - CM/SW Discharge Note ? ? ?Patient Details  ?Name: Lorraine Brandt ?MRN: [id number] ?Date of Birth: 09/03/1933 ? ?Transition of Care (TOC) CM/SW Contact:  ?[place], LCSW ?Phone Number: ?07/18/2021, 2:16 PM ? ? ?Clinical Narrative:    ? ?Patient will DC to: [facility] ?Anticipated DC date: 07/18/21 ?Family notified: Daughter Steward Drone ?Transport by: Corey Harold ? ? ?Per MD patient ready for DC to [facility]. RN, patient, patient's family, and facility notified of DC. Discharge Summary and FL2 sent to facility. RN to call report prior to discharge [phone] Room "[facility]"). DC packet on chart. Ambulance transport requested for patient.  ? ?CSW will sign off for now as social work intervention is no longer needed. Please consult [place] again if new needs arise.  ? ?Final next level of care: [place] ?Barriers to Discharge: No Barriers Identified ? ? ?Patient Goals and CMS Choice ?Patient states their goals for this hospitalization and ongoing recovery are:: Rehab ?CMS Medicare.gov Compare Post Acute Care list provided to:: Patient ?Choice offered to / list presented to : Patient ? ?Discharge Placement ?  ?           ?Patient chooses bed at: [facility] ?Patient to be transferred to facility by: [facility] ?Name of family member notified: Daughter Steward Drone ?Patient and family notified of of transfer: 07/18/21 ? ?Discharge Plan and Services ?In-house Referral: Clinical Social Work ?  ?Post Acute Care Choice: [place]          ?  ?  ?  ?  ?  ?  ?  ?  ?  ?  ? ?Social Determinants of Health (SDOH) Interventions ?  ? ? ?Readmission Risk Interventions ?No flowsheet data found. ? ? ? ? ?

## 2021-07-19 ENCOUNTER — Encounter: Payer: Self-pay | Admitting: Orthopedic Surgery

## 2021-07-19 ENCOUNTER — Non-Acute Institutional Stay (SKILLED_NURSING_FACILITY): Payer: Medicare Other | Admitting: Orthopedic Surgery

## 2021-07-19 DIAGNOSIS — I5033 Acute on chronic diastolic (congestive) heart failure: Secondary | ICD-10-CM

## 2021-07-19 DIAGNOSIS — E871 Hypo-osmolality and hyponatremia: Secondary | ICD-10-CM

## 2021-07-19 DIAGNOSIS — I10 Essential (primary) hypertension: Secondary | ICD-10-CM

## 2021-07-19 DIAGNOSIS — I4892 Unspecified atrial flutter: Secondary | ICD-10-CM

## 2021-07-19 DIAGNOSIS — E039 Hypothyroidism, unspecified: Secondary | ICD-10-CM

## 2021-07-19 DIAGNOSIS — S12501D Unspecified nondisplaced fracture of sixth cervical vertebra, subsequent encounter for fracture with routine healing: Secondary | ICD-10-CM

## 2021-07-19 DIAGNOSIS — J9601 Acute respiratory failure with hypoxia: Secondary | ICD-10-CM | POA: Diagnosis not present

## 2021-07-19 DIAGNOSIS — E7800 Pure hypercholesterolemia, unspecified: Secondary | ICD-10-CM

## 2021-07-19 DIAGNOSIS — M159 Polyosteoarthritis, unspecified: Secondary | ICD-10-CM

## 2021-07-19 NOTE — Progress Notes (Signed)
Location:  [facility] Room Number: N041/A Place of Service:  SNF [phone]) Provider: Yvonna Alanis, NP  Patient Care Team: Virgie Dad, MD as PCP - General (Internal Medicine) Skeet Latch, MD as PCP - Cardiology (Cardiology)  Extended Emergency Contact Information Primary Emergency Contact: Lorraine Brandt,Lorraine Brandt Mobile Phone: [phone] Relation: Daughter Secondary Emergency Contact: Lorraine Brandt,Lorraine Brandt Address: [address] [place], [place] [address] [name] Phone: [phone] Mobile Phone: [phone] Relation: Spouse  Code Status:  Full code Goals of care: Advanced Directive information Advanced Directives 07/19/2021  Does Patient Have a Medical Advance Directive? No  Type of Advance Directive -  Does patient want to make changes to medical advance directive? No - Patient declined  Copy of [place] in Chart? -  Would patient like information on creating a medical advance directive? -     Chief Complaint  Patient presents with   Acute Visit    Shortness of breath    HPI:  Pt is a 88 y.o. female seen today for an acute visit for shortness of breath.   Hospitalized 03/03- 03/09 at [facility] due to hyponatremia. Prior to hospitalization she had 2 falls, with one injuring her neck. Diagnosed with C6-C7 fracture and advised to wear c-collar per neurosurgery. Sodium 114 03/03. Diagnosed with euvolemic hyponatremia due to SIADH. Urine osmolality 455, serum osmolality 243. She improved with hypertonic fluids, but ended up being on fluid restrictions, urea tablets and furosemide. Lexapro was held. In addition, she was found to have acute CHF. Echo LVEF 60-65%. CXR indicated fluid overload. IV furosemide made hyponatremia worse, she was taken off furosemide at discharge. No cardiac complications during hospitalization. Lisinopril stopped due to hypokalemia. Remains on cardizem and coreg for HTN  and atrial fibrillation. Required oxygen due to acute hypoxic respiratory failure. Remains on 2 liters today. Continues to use incentive spirometer. TSH 0.32, synthroid decreased to 100 mcg. Delirium noted in hospital, answers appropriately today. Discharged to skilled unit at [facility].   Today, she reports feeling more sob. Mild crackles noted by nursing staff. Remains on 2 liters oxygen today. Using incentive spirometer. Increased pain to hands due to arthritis. Requesting pain relief. Wearing tennis shoes in bed to assist with heel pain.    Past Medical History:  Diagnosis Date   Arthritis    oa   Cancer ([place])    basal cell skin cancer, multiple areas removed, thryoid   Central retinal artery occlusion 07/07/2019   DJD (degenerative joint disease)    Essential hypertension 07/07/2019   GERD (gastroesophageal reflux disease)    takes prilosec    Hypertension    Hypothyroidism since 2001   Osteoporosis    Polymyalgia rheumatica ([place])    Pure hypercholesterolemia 07/07/2019   Sleep apnea    uses mouthpiece somnodent, pt wishes to leave mouthpiece at home, pt told moderate severe sleep apnea   Thyroid disease    Vaginal delivery 1958, [id number]   Past Surgical History:  Procedure Laterality Date   ANTERIOR AND POSTERIOR REPAIR N/A 11/02/2014   Procedure: ANTERIOR (CYSTOCELE) Repair AND Possible POSTERIOR REPAIR (RECTOCELE);  Surgeon: Princess Bruins, MD;  Location: [facility];  Service: Gynecology;  Laterality: N/A;   BUBBLE STUDY  07/28/2019   Procedure: BUBBLE STUDY;  Surgeon: [name], MD;  Location: [facility];  Service: Cardiovascular;;   CYSTOSCOPY N/A 11/02/2014   Procedure: CYSTOSCOPY;  Surgeon:  Princess Bruins, MD;  Location: [facility];  Service: Gynecology;  Laterality: N/A;   DILATION AND CURETTAGE OF UTERUS     EYE SURGERY  2007   both eyes lens replacements for cataracts   LAMINECTOMY     ROBOTIC ASSISTED LAPAROSCOPIC SACROCOLPOPEXY N/A 11/02/2014    Procedure: ROBOTIC ASSISTED LAPAROSCOPIC Uterosacral ligament suspension for SACROCOLPOPEXY;  Surgeon: Princess Bruins, MD;  Location: [facility];  Service: Gynecology;  Laterality: N/A;   ROBOTIC ASSISTED TOTAL HYSTERECTOMY WITH BILATERAL SALPINGO OOPHERECTOMY Bilateral 11/02/2014   Procedure: ROBOTIC ASSISTED TOTAL HYSTERECTOMY WITH BILATERAL SALPINGO OOPHORECTOMY;  Surgeon: Princess Bruins, MD;  Location: [facility];  Service: Gynecology;  Laterality: Bilateral;   THYROIDECTOMY  2001   and radiation done also, partial   TONSILLECTOMY     TOTAL KNEE ARTHROPLASTY Right 04/25/2014   Procedure: RIGHT TOTAL KNEE ARTHROPLASTY;  Surgeon: Gearlean Alf, MD;  Location: [facility];  Service: Orthopedics;  Laterality: Right;   TUBAL LIGATION      Allergies  Allergen Reactions   Oxycodone Other (See Comments)    Makes me crazy   Celebrex [Celecoxib] Itching and Rash    Had itching, burning and rash to it. Likely adverse effect rather than allergic.   Latex Rash    Outpatient Encounter Medications as of 07/19/2021  Medication Sig   acetaminophen (TYLENOL) 325 MG tablet Take 2 tablets (650 mg total) by mouth every 6 (six) hours as needed for mild pain (or Fever >/= 101).   Ascorbic Acid (VITAMIN C) 500 MG CAPS Take 500 mg by mouth daily.   bisacodyl (DULCOLAX) 5 MG EC tablet Take 1 tablet (5 mg total) by mouth daily as needed for moderate constipation.   Calcium Carb-Cholecalciferol (CALCIUM 500/D PO) Take 200 Units by mouth in the morning and at bedtime.   Carboxymethylcellul-Glycerin (LUBRICATING EYE DROPS OP) Place 1 drop into both eyes daily as needed (dry eyes).   carvedilol (COREG) 12.5 MG tablet Take 1 tablet (12.5 mg total) by mouth 2 (two) times daily with a meal.   cholecalciferol (VITAMIN D3) 25 MCG (1000 UT) tablet Take 1,000 Units by mouth daily.   diltiazem (CARDIZEM CD) 180 MG 24 hr capsule Take 1 capsule (180 mg total) by mouth every morning.   docusate sodium (COLACE) 100 MG capsule Take  100 mg by mouth daily as needed for mild constipation.   feeding supplement (ENSURE ENLIVE / ENSURE PLUS) LIQD Take 237 mLs by mouth 2 (two) times daily between meals.   levothyroxine (SYNTHROID) 100 MCG tablet Take 1 tablet (100 mcg total) by mouth daily before breakfast. Please take on empty stomach every Monday, Tuesday, Wednesday, Thursday and Friday.   melatonin 10 MG TABS Take 10 mg by mouth at bedtime as needed.   Omega-3 Fatty Acids (FISH OIL PO) Take 2,000 mg by mouth daily.   omeprazole (PRILOSEC) 20 MG capsule Take 20 mg by mouth every morning.    rosuvastatin (CRESTOR) 20 MG tablet Take 1 tablet (20 mg total) by mouth daily.   [DISCONTINUED] CALCIUM-VITAMIN D PO Take 1 tablet by mouth in the morning and at bedtime.    No facility-administered encounter medications on file as of 07/19/2021.    Review of Systems  Constitutional:  Negative for activity change, appetite change, chills, fatigue and fever.  HENT:  Negative for congestion and trouble swallowing.   Eyes:  Negative for visual disturbance.  Respiratory:  Positive for shortness of breath. Negative for cough and wheezing.   Cardiovascular:  Negative for chest  pain and leg swelling.  Gastrointestinal:  Negative for abdominal distention, abdominal pain, constipation, diarrhea and nausea.  Genitourinary:  Negative for dysuria, frequency and hematuria.  Musculoskeletal:  Positive for arthralgias, gait problem and neck pain.  Skin:  Negative for wound.  Neurological:  Positive for weakness. Negative for dizziness and headaches.  Psychiatric/Behavioral:  Positive for confusion and dysphoric mood. Negative for sleep disturbance. The patient is not nervous/anxious.    Immunization History  Administered Date(s) Administered   Influenza Split 02/26/2010, 03/03/2011, 03/12/2011, 03/02/2012, 05/12/2012, 02/14/2021   Moderna Covid-19 Vaccine Bivalent Booster 75yr & up 02/07/2021   Moderna SARS-COV2 Booster Vaccination 03/26/2020,  10/17/2020   Moderna Sars-Covid-2 Vaccination 05/16/2019, 06/13/2019   Zoster Recombinat (Shingrix) 02/11/2020   Pertinent  Health Maintenance Due  Topic Date Due   INFLUENZA VACCINE  Completed   DEXA SCAN  Completed   Fall Risk 07/15/2021 07/16/2021 07/16/2021 07/17/2021 07/18/2021  Patient Fall Risk Level High fall risk High fall risk High fall risk High fall risk High fall risk   Functional Status Survey:    Vitals:   07/19/21 1211  BP: (!) 153/90  Pulse: 83  Resp: 18  Temp: 99 F (37.2 C)  SpO2: (!) 88%  Height: '5\' 1"'$  (1.549 m)   Body mass index is 25.08 kg/m. Physical Exam Vitals reviewed.  Constitutional:      General: She is not in acute distress. HENT:     Head: Normocephalic.     Right Ear: There is no impacted cerumen.     Left Ear: There is no impacted cerumen.     Nose: Nose normal.     Mouth/Throat:     Mouth: Mucous membranes are moist.  Eyes:     General:        Right eye: No discharge.        Left eye: No discharge.  Neck:     Vascular: No carotid bruit.     Comments: C-collar Cardiovascular:     Rate and Rhythm: Normal rate. Rhythm irregular.     Pulses: Normal pulses.     Heart sounds: Normal heart sounds.  Pulmonary:     Effort: Pulmonary effort is normal. No respiratory distress.     Breath sounds: Examination of the right-upper field reveals rales. Examination of the left-upper field reveals rales. Rales present. No wheezing.  Abdominal:     General: Bowel sounds are normal. There is no distension.     Palpations: Abdomen is soft.     Tenderness: There is no abdominal tenderness.  Musculoskeletal:     Cervical back: Neck supple.     Right lower leg: No edema.     Left lower leg: No edema.  Lymphadenopathy:     Cervical: No cervical adenopathy.  Skin:    General: Skin is warm and dry.     Capillary Refill: Capillary refill takes less than 2 seconds.     Comments: Skin on heels intact, skin blanchable  Neurological:     General: No focal  deficit present.     Mental Status: She is alert and oriented to person, place, and time.     Motor: Weakness present.     Gait: Gait abnormal.  Psychiatric:        Mood and Affect: Mood normal.        Behavior: Behavior normal.     Comments: Very pleasant, follows commands    Labs reviewed: Recent Labs    07/14/21 2359 07/15/21 0751 07/16/21 0352 07/16/21  0932 07/16/21 1613 07/17/21 0428 07/18/21 0250  NA 123*   < > 126*   < > 126* 127* 129*  K 3.7  --  3.9  --  3.6 3.2* 5.2*  CL 88*  --  89*  --  91* 88* 92*  CO2 27  --  27  --  '25 30 28  '$ GLUCOSE 114*  --  99  --  145* 106* 105*  BUN 45*  --  40*  --  61* 49* 50*  CREATININE 0.59  --  0.56  --  1.04* 0.68 0.56  CALCIUM 8.2*  --  8.9  --  8.9 8.9 9.6  MG 2.0  --  2.0  --   --   --  2.2  PHOS  --   --   --   --   --  4.8*  --    < > = values in this interval not displayed.   Recent Labs    07/14/21 0420 07/14/21 2359 07/16/21 0352  AST '17 24 26  '$ ALT 25 29 32  ALKPHOS 86 92 93  BILITOT 0.5 0.5 1.1  PROT 5.0* 5.1* 5.4*  ALBUMIN 2.8* 2.8* 2.9*   Recent Labs    07/14/21 0420 07/14/21 2359 07/16/21 0352 07/17/21 0428 07/18/21 0250  WBC 12.2* 11.0* 11.6* 12.7* 11.8*  NEUTROABS 10.7* 9.5* 9.8*  --   --   HGB 12.0 11.8* 12.7 13.2 12.3  HCT 32.4* 32.6* 34.5* 35.8* 35.3*  MCV 89.5 91.8 90.8 92.3 94.4  PLT 240 235 249 289 267   Lab Results  Component Value Date   TSH 0.329 (L) 07/14/2021   No results found for: HGBA1C No results found for: CHOL, HDL, LDLCALC, LDLDIRECT, TRIG, CHOLHDL  Significant Diagnostic Results in last 30 days:  DG Chest 2 View  Result Date: 07/12/2021 CLINICAL DATA:  Pt brought from home for c/o intermittent AMS, increased general weakness and decreased mobility. Pt has non productive cough. Pt denies any CP but states she has been feeling SOB x a few days Nonsmoker Hx of HTN, cancer, GERD, EXAM: CHEST - 2 VIEW COMPARISON:  04/19/2014 FINDINGS: Progressive development of coarse peripheral  interstitial opacities, worse left than right, involving bases more than apices. Heart size upper limits normal.  Tortuous ectatic thoracic aorta. No effusion. Levoscoliosis in the upper thoracic spine with suspected vertebral segmentation anomaly, incompletely characterized. IMPRESSION: 1. Coarse asymmetric interstitial opacities left greater than right, suggesting progressive chronic interstitial lung disease. Cannot exclude superimposed interstitial infiltrate or asymmetric edema. Electronically Signed   By: Lucrezia Europe M.D.   On: 07/12/2021 10:10   DG Lumbar Spine Complete  Result Date: 07/03/2021 CLINICAL DATA:  Status post fall with lower back pain. EXAM: LUMBAR SPINE - COMPLETE 4+ VIEW COMPARISON:  February 25, 2021 FINDINGS: There is no evidence of acute lumbar spine fracture. Mild dextroscoliosis is seen with stable grade 1 anterolisthesis of the L4 vertebral body on L5. Mild to moderate severity multilevel endplate sclerosis is seen. Stable marked severity intervertebral disc space narrowing is noted at the level of L4-L5. IMPRESSION: 1. No evidence of acute lumbar spine fracture. 2. Stable marked severity degenerative disc disease at L4-L5 with stable grade 1 anterolisthesis of the L4 vertebral body. Electronically Signed   By: Virgina Norfolk M.D.   On: 07/03/2021 02:47   CT HEAD WO CONTRAST (5MM)  Result Date: 07/18/2021 CLINICAL DATA:  Altered mental status.  Fall. EXAM: CT HEAD WITHOUT CONTRAST TECHNIQUE: Contiguous axial images were  obtained from the base of the skull through the vertex without intravenous contrast. RADIATION DOSE REDUCTION: This exam was performed according to the departmental dose-optimization program which includes automated exposure control, adjustment of the mA and/or kV according to patient size and/or use of iterative reconstruction technique. COMPARISON:  07/04/2021 FINDINGS: Brain: Expected cerebral and cerebellar atrophy for age. Moderate low density in the  periventricular white matter likely related to small vessel disease. No mass lesion, hemorrhage, hydrocephalus, acute infarct, intra-axial, or extra-axial fluid collection. Vascular: No hyperdense vessel or unexpected calcification. Skull: Decrease in left parietooccipital scalp soft tissue swelling. No skull fracture. Sinuses/Orbits: Normal imaged portions of the orbits and globes. Clear paranasal sinuses and mastoid air cells. Other: None. IMPRESSION: 1.  No acute intracranial abnormality. 2.  Cerebral atrophy and small vessel ischemic change. 3. Improvement in scalp soft tissue swelling. Electronically Signed   By: Abigail Miyamoto M.D.   On: 07/18/2021 13:10   CT Head Wo Contrast  Result Date: 07/04/2021 CLINICAL DATA:  Trauma EXAM: CT HEAD WITHOUT CONTRAST TECHNIQUE: Contiguous axial images were obtained from the base of the skull through the vertex without intravenous contrast. RADIATION DOSE REDUCTION: This exam was performed according to the departmental dose-optimization program which includes automated exposure control, adjustment of the mA and/or kV according to patient size and/or use of iterative reconstruction technique. COMPARISON:  07/03/2021 FINDINGS: Brain: No acute intracranial findings are seen. There are no signs of intracranial bleeding. There are no epidural or subdural fluid collections. Cortical sulci are prominent. There is decreased density in the periventricular white matter. Vascular: Unremarkable. Skull: No fracture is seen in the calvarium. There is subcutaneous contusion/hematoma in the posterior parietal scalp. Sinuses/Orbits: There are no air-fluid levels. Other: None IMPRESSION: No acute intracranial findings are seen in noncontrast CT brain. There is subcutaneous contusion/hematoma in the parietal scalp. No fracture is seen in the calvarium. Electronically Signed   By: Elmer Picker M.D.   On: 07/04/2021 17:47   CT Head Wo Contrast  Result Date: 07/03/2021 CLINICAL DATA:   Recent slip and fall with headaches and neck pain, initial encounter EXAM: CT HEAD WITHOUT CONTRAST CT CERVICAL SPINE WITHOUT CONTRAST TECHNIQUE: Multidetector CT imaging of the head and cervical spine was performed following the standard protocol without intravenous contrast. Multiplanar CT image reconstructions of the cervical spine were also generated. RADIATION DOSE REDUCTION: This exam was performed according to the departmental dose-optimization program which includes automated exposure control, adjustment of the mA and/or kV according to patient size and/or use of iterative reconstruction technique. COMPARISON:  04/18/2019 FINDINGS: CT HEAD FINDINGS Brain: No evidence of acute infarction, hemorrhage, hydrocephalus, extra-axial collection or mass lesion/mass effect. Chronic atrophic and ischemic changes are noted. Vascular: No hyperdense vessel or unexpected calcification. Skull: Normal. Negative for fracture or focal lesion. Sinuses/Orbits: No acute finding. Other: None. CT CERVICAL SPINE FINDINGS Alignment: Loss of the normal cervical lordosis is noted. Anterolisthesis of C4 on C5 is seen felt to be degenerative in nature. Skull base and vertebrae: 7 cervical segments are well visualized. Vertebral body height is well maintained. At least partial fusion at C5-6 is noted. Anterolisthesis of C4 on C5 is noted of a degenerative nature. No acute fracture or acute facet abnormality is noted. Multilevel facet hypertrophic changes are seen. Significant scoliosis in the upper thoracic spine is noted concave to the left. There are findings suspicious for hemi vertebra between C6 and T1 vertebra on the left. Soft tissues and spinal canal: Surrounding soft tissue structures show vascular  calcifications. Upper chest: Visualized lung apices are within normal limits. Other: None IMPRESSION: CT of the head: Chronic atrophic and ischemic changes. CT of the cervical spine: Multilevel degenerative change. No acute  abnormality noted. 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 Electronically Signed   By: Inez Catalina M.D.   On: 07/03/2021 04:02   CT CHEST WO CONTRAST  Result Date: 07/14/2021 CLINICAL DATA:  Cough for greater than 8 weeks. Progressive weakness. EXAM: CT CHEST WITHOUT CONTRAST TECHNIQUE: Multidetector CT imaging of the chest was performed following the standard protocol without IV contrast. RADIATION DOSE REDUCTION: This exam was performed according to the departmental dose-optimization program which includes automated exposure control, adjustment of the mA and/or kV according to patient size and/or use of iterative reconstruction technique. COMPARISON:  None. FINDINGS: Cardiovascular: Normal heart size. No pericardial effusion. Aortic atherosclerosis and coronary artery calcifications. Mediastinum/Nodes: No enlarged mediastinal or axillary lymph nodes. Thyroid gland, trachea, and esophagus demonstrate no significant findings. Lungs/Pleura: No pleural effusion, airspace consolidation, atelectasis, or pneumothorax. No interstitial edema. Signs of chronic interstitial lung disease identified with peripheral predominant subpleural reticulation, traction bronchiectasis and multifocal patchy areas of ground-glass attenuation. Thickening of the peribronchovascular interstitium is also identified. No frank honeycombing noted. Calcified granuloma is noted within the left upper lobe. Upper Abdomen: No acute findings within the imaged portions of the upper abdomen. Subcapsular cyst within medial segment of left hepatic lobe measures 9 mm. Aortic atherosclerotic calcifications. Musculoskeletal: Thoracolumbar scoliosis deformity with mild degenerative disc disease. No acute or suspicious osseous findings. IMPRESSION: 1. No acute cardiopulmonary abnormality. 2. Signs of chronic interstitial lung disease including peripheral subpleural reticulation, traction bronchiectasis and multifocal areas of ground-glass attenuation.  Recommend follow-up imaging with high-resolution CT of the chest in 6 months to assess for temporal change in the appearance of the lungs. 3. Thoracolumbar scoliosis and degenerative disc disease. 4. Aortic Atherosclerosis (ICD10-I70.0). Electronically Signed   By: Kerby Moors M.D.   On: 07/14/2021 14:38   CT Cervical Spine Wo Contrast  Addendum Date: 07/04/2021   ADDENDUM REPORT: 07/04/2021 17:44 ADDENDUM: Imaging findings were relayed to patient's provider Theodis Blaze by telephone call. Electronically Signed   By: Elmer Picker M.D.   On: 07/04/2021 17:44   Result Date: 07/04/2021 CLINICAL DATA:  Trauma EXAM: CT CERVICAL SPINE WITHOUT CONTRAST TECHNIQUE: Multidetector CT imaging of the cervical spine was performed without intravenous contrast. Multiplanar CT image reconstructions were also generated. RADIATION DOSE REDUCTION: This exam was performed according to the departmental dose-optimization program which includes automated exposure control, adjustment of the mA and/or kV according to patient size and/or use of iterative reconstruction technique. COMPARISON:  07/03/2021 FINDINGS: Alignment: There is minimal anterolisthesis at C4-C5 level suggesting previous ligament injury and facet degeneration. There is congenital partial fusion of bodies of C5 and C6 vertebrae. There is a radiolucent line in the right lamina of C6 vertebra. There is radiolucent line in the left anterior superior C7 vertebra. There is congenital partial fusion of bodies of T1 and T2 vertebrae. 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 Skull base and vertebrae: Degenerative changes are noted with bony spurs and facet hypertrophy at multiple levels in the cervical spine. Soft tissues and spinal canal: There is no significant central spinal stenosis. Disc levels: There is encroachment of neural foramina from C2-C7 levels. Upper chest: Unremarkable. Other: Thyroid is not seen suggesting atrophy  or previous removal. IMPRESSION: There is radiolucent line in the right lamina of C6 vertebra.  There is radiolucent line in the anterior superior left side body C7 vertebra. Possibility of recent fractures is not excluded. Follow-up MRI as clinically warranted should be considered. Cervical spondylosis with encroachment of neural foramina at multiple levels. Congenital deformity is seen in the lower cervical and upper thoracic spine. Other findings as described in the body of the report. Electronically Signed: By: Elmer Picker M.D. On: 07/04/2021 17:40   CT Cervical Spine Wo Contrast  Result Date: 07/03/2021 CLINICAL DATA:  Recent slip and fall with headaches and neck pain, initial encounter EXAM: CT HEAD WITHOUT CONTRAST CT CERVICAL SPINE WITHOUT CONTRAST TECHNIQUE: Multidetector CT imaging of the head and cervical spine was performed following the standard protocol without intravenous contrast. Multiplanar CT image reconstructions of the cervical spine were also generated. RADIATION DOSE REDUCTION: This exam was performed according to the departmental dose-optimization program which includes automated exposure control, adjustment of the mA and/or kV according to patient size and/or use of iterative reconstruction technique. COMPARISON:  04/18/2019 FINDINGS: CT HEAD FINDINGS Brain: No evidence of acute infarction, hemorrhage, hydrocephalus, extra-axial collection or mass lesion/mass effect. Chronic atrophic and ischemic changes are noted. Vascular: No hyperdense vessel or unexpected calcification. Skull: Normal. Negative for fracture or focal lesion. Sinuses/Orbits: No acute finding. Other: None. CT CERVICAL SPINE FINDINGS Alignment: Loss of the normal cervical lordosis is noted. Anterolisthesis of C4 on C5 is seen felt to be degenerative in nature. Skull base and vertebrae: 7 cervical segments are well visualized. Vertebral body height is well maintained. At least partial fusion at C5-6 is noted.  Anterolisthesis of C4 on C5 is noted of a degenerative nature. No acute fracture or acute facet abnormality is noted. Multilevel facet hypertrophic changes are seen. Significant scoliosis in the upper thoracic spine is noted concave to the left. There are findings suspicious for hemi vertebra between C6 and T1 vertebra on the left. Soft tissues and spinal canal: Surrounding soft tissue structures show vascular calcifications. Upper chest: Visualized lung apices are within normal limits. Other: None IMPRESSION: CT of the head: Chronic atrophic and ischemic changes. CT of the cervical spine: Multilevel degenerative change. No acute abnormality noted. 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 Electronically Signed   By: Inez Catalina M.D.   On: 07/03/2021 04:02   MR CERVICAL SPINE WO CONTRAST  Result Date: 07/10/2021 CLINICAL DATA:  Cervicalgia EXAM: MRI CERVICAL SPINE WITHOUT CONTRAST TECHNIQUE: Multiplanar, multisequence MR imaging of the cervical spine was performed. No intravenous contrast was administered. COMPARISON:  Cervical spine CT 07/04/2021 FINDINGS: Alignment: Segmentation anomalies at the cervicothoracic junction contribute to severe levoscoliosis of the upper thoracic spine. Grade 1 anterolisthesis at C3-4 and C4-5. Vertebrae: There is edema at the superior C7 endplate in the region of the fracture identified on the earlier CT. There is also edema at the site of the C6 spinous process fracture. Assessment of the bones is otherwise limited by the degree of motion. Cord: Assessment severely limited by motion. Posterior Fossa, vertebral arteries, paraspinal tissues: Negative. Disc levels: C1-2: Unremarkable. C2-3: No disc herniation. There is no spinal canal stenosis. No neural foraminal stenosis. C3-4: Facet arthrosis, left-greater-than-right, and left foraminal disc protrusion. There is no spinal canal stenosis. Severe left neural foraminal stenosis. C4-5: Severe left facet arthrosis. There is no  spinal canal stenosis. Severe left neural foraminal stenosis. C5-6: No disc herniation, spinal canal stenosis or neural impingement C6-7: No disc herniation, spinal canal stenosis or neural impingement . C7-T1: No disc herniation, spinal canal stenosis or neural  impingement IMPRESSION: 1. Severely motion degraded study. 2. Edema at the site of the superior C7 endplate fracture and C6 spinous process fracture. 3. Severe left C3-4 and C4-5 neural foraminal stenosis secondary to combination of facet arthrosis and left foraminal disc protrusion. 4. Cervicothoracic segmentation anomalies with marked scoliosis. Electronically Signed   By: Ulyses Jarred M.D.   On: 07/10/2021 21:34   DG Chest Port 1 View  Result Date: 07/13/2021 CLINICAL DATA:  88 year old female with history of shortness of breath. EXAM: PORTABLE CHEST 1 VIEW COMPARISON:  Chest x-ray 07/12/2021. FINDINGS: Lung volumes are low. No consolidative airspace disease. No pleural effusions. Widespread interstitial prominence, most evident throughout the mid to lower lungs bilaterally, similar to the prior examination no pneumothorax. No evidence of pulmonary edema. Heart size is normal. Numerous calcified mediastinal and bilateral hilar lymph nodes are incidentally noted. Upper mediastinal contours are distorted by patient positioning. Atherosclerosis in the thoracic aorta. IMPRESSION: 1. Low lung volumes with diffuse interstitial prominence, most evident throughout the mid to lower lungs bilaterally, concerning for interstitial lung disease. Further evaluation with nonemergent high-resolution chest CT is recommended in the near future to better evaluate these findings. 2. Aortic atherosclerosis. Electronically Signed   By: Vinnie Langton M.D.   On: 07/13/2021 06:36   ECHOCARDIOGRAM COMPLETE  Result Date: 07/12/2021    ECHOCARDIOGRAM REPORT   Patient Name:   ANN BOHNE Date of Exam: 07/12/2021 Medical Rec #:  [id number]           Height:       61.0  in Accession #:    [id number]          Weight:       125.0 lb Date of Birth:  1934-02-09          BSA:          1.547 m Patient Age:    61 years            BP:           136/84 mmHg Patient Gender: F                   HR:           71 bpm. Exam Location:  Inpatient Procedure: 2D Echo, Cardiac Doppler and Color Doppler Indications:    CHF  History:        Patient has prior history of Echocardiogram examinations, most                 recent 07/28/2019. CHF; Risk Factors:Hypertension and                 Dyslipidemia.  Sonographer:    Luisa Hart RDCS Referring Phys: [id number]aree Mackintosh [place]  1. Prominent basal septal hypertrophy and hypertrophied papilary muscles no mid cavitary gradient recoreded No SAM . Left ventricular ejection fraction, by estimation, is 60 to 65%. The left ventricle has normal function. The left ventricle has no regional wall motion abnormalities. There is severe asymmetric left ventricular hypertrophy of the basal and septal segments. Left ventricular diastolic parameters are consistent with Grade II diastolic dysfunction (pseudonormalization). Elevated left ventricular end-diastolic pressure.  2. Right ventricular systolic function is normal. The right ventricular size is normal.  3. The pericardial effusion is posterior to the left ventricle.  4. The mitral valve is abnormal. Trivial mitral valve regurgitation. No evidence of mitral stenosis.  5. Small gradient across AV but morphologically opens well . The aortic valve  is tricuspid. There is mild calcification of the aortic valve. There is mild thickening of the aortic valve. Aortic valve regurgitation is mild. Aortic valve sclerosis is present, with no evidence of aortic valve stenosis.  6. The inferior vena cava is normal in size with greater than 50% respiratory variability, suggesting right atrial pressure of 3 mmHg. FINDINGS  Left Ventricle: Prominent basal septal hypertrophy and hypertrophied papilary muscles no mid cavitary  gradient recoreded No SAM. Left ventricular ejection fraction, by estimation, is 60 to 65%. The left ventricle has normal function. The left ventricle has no regional wall motion abnormalities. The left ventricular internal cavity size was normal in size. There is severe asymmetric left ventricular hypertrophy of the basal and septal segments. Left ventricular diastolic parameters are consistent with Grade II diastolic dysfunction (pseudonormalization). Elevated left ventricular end-diastolic pressure. Right Ventricle: The right ventricular size is normal. No increase in right ventricular wall thickness. Right ventricular systolic function is normal. Left Atrium: Left atrial size was normal in size. Right Atrium: Right atrial size was normal in size. Pericardium: Trivial pericardial effusion is present. The pericardial effusion is posterior to the left ventricle. Mitral Valve: The mitral valve is abnormal. There is mild thickening of the mitral valve leaflet(s). There is moderate calcification of the mitral valve leaflet(s). Mild mitral annular calcification. Trivial mitral valve regurgitation. No evidence of mitral valve stenosis. MV peak gradient, 4.4 mmHg. The mean mitral valve gradient is 2.0 mmHg. Tricuspid Valve: The tricuspid valve is normal in structure. Tricuspid valve regurgitation is trivial. No evidence of tricuspid stenosis. Aortic Valve: Small gradient across AV but morphologically opens well. The aortic valve is tricuspid. There is mild calcification of the aortic valve. There is mild thickening of the aortic valve. Aortic valve regurgitation is mild. Aortic regurgitation PHT measures 565 msec. Aortic valve sclerosis is present, with no evidence of aortic valve stenosis. Aortic valve mean gradient measures 7.0 mmHg. Aortic valve peak gradient measures 13.3 mmHg. Aortic valve area, by VTI measures 1.78 cm. Pulmonic Valve: The pulmonic valve was normal in structure. Pulmonic valve regurgitation is not  visualized. No evidence of pulmonic stenosis. Aorta: The aortic root is normal in size and structure. Venous: The inferior vena cava is normal in size with greater than 50% respiratory variability, suggesting right atrial pressure of 3 mmHg. IAS/Shunts: No atrial level shunt detected by color flow Doppler.  LEFT VENTRICLE PLAX 2D LVIDd:         3.00 cm     Diastology LVIDs:         1.40 cm     LV e' medial:    4.43 cm/s LV PW:         1.00 cm     LV E/e' medial:  17.2 LV IVS:        1.80 cm     LV e' lateral:   6.30 cm/s LVOT diam:     1.80 cm     LV E/e' lateral: 12.1 LV SV:         57 LV SV Index:   37 LVOT Area:     2.54 cm  LV Volumes (MOD) LV vol d, MOD A2C: 29.0 ml LV vol d, MOD A4C: 22.0 ml LV vol s, MOD A2C: 7.9 ml LV vol s, MOD A4C: 4.8 ml LV SV MOD A2C:     21.1 ml LV SV MOD A4C:     22.0 ml LV SV MOD BP:      20.8 ml RIGHT VENTRICLE  RV Basal diam:  2.60 cm RV Mid diam:    2.60 cm RV S prime:     13.40 cm/s TAPSE (M-mode): 1.6 cm LEFT ATRIUM             Index        RIGHT ATRIUM          Index LA diam:        3.20 cm 2.07 cm/m   RA Area:     8.13 cm LA Vol (A2C):   40.2 ml 25.99 ml/m  RA Volume:   12.60 ml 8.15 ml/m LA Vol (A4C):   39.1 ml 25.28 ml/m LA Biplane Vol: 41.9 ml 27.09 ml/m  AORTIC VALVE                     PULMONIC VALVE AV Area (Vmax):    1.76 cm      PV Vmax:       1.00 m/s AV Area (Vmean):   1.76 cm      PV Vmean:      65.550 cm/s AV Area (VTI):     1.78 cm      PV VTI:        0.196 m AV Vmax:           182.50 cm/s   PV Peak grad:  4.0 mmHg AV Vmean:          120.000 cm/s  PV Mean grad:  2.5 mmHg AV VTI:            0.322 m AV Peak Grad:      13.3 mmHg AV Mean Grad:      7.0 mmHg LVOT Vmax:         126.00 cm/s LVOT Vmean:        83.200 cm/s LVOT VTI:          0.225 m LVOT/AV VTI ratio: 0.70 AI PHT:            565 msec  AORTA Ao Root diam: 2.60 cm Ao Asc diam:  3.20 cm MITRAL VALVE               TRICUSPID VALVE MV Area (PHT): 3.42 cm    TR Peak grad:   33.9 mmHg MV Area VTI:   1.90  cm    TR Vmax:        291.00 cm/s MV Peak grad:  4.4 mmHg MV Mean grad:  2.0 mmHg    SHUNTS MV Vmax:       1.05 m/s    Systemic VTI:  0.22 m MV Vmean:      60.5 cm/s   Systemic Diam: 1.80 cm MV Decel Time: 222 msec MV E velocity: 76.20 cm/s MV A velocity: 82.50 cm/s MV E/A ratio:  0.92 Jenkins Rouge MD Electronically signed by Jenkins Rouge MD Signature Date/Time: 07/12/2021/2:40:27 PM    Final    [place] EKG SITE RITE  Result Date: 07/17/2021 If Site Rite image not attached, placement could not be confirmed due to current cardiac rhythm.   Assessment/Plan 1. Acute on chronic diastolic CHF (congestive heart failure) (HCC) - Echo LVEF 60-65% - increased sob today, mild rales noted on exam - start furosemide 20 mg po daily x 4 days - start potassium chloride 10 meq po daily x 4 days  2. Acute respiratory failure with hypoxia (HCC) - remains on continuous oxygen - cont weaning trials - cont incentive spirometer 10x/QID and prn - schedule pulmonary f/u  3. Hyponatremia - d/t SIADH - cont 1200 cc fluid restriction - off Lexapro - bmp  4. Closed nondisplaced fracture of sixth cervical vertebra with routine healing, unspecified fracture morphology, subsequent encounter - fracture to C6-C7 due to fall - remains in c-collar  5. Essential hypertension - BUN/creat 50/0.56 03/09 - cont coreg - cbc/diff  6. Atrial flutter, unspecified type ([place]) - HR controlled with cardizem  - off blood thinner due to falls  7. Pure hypercholesterolemia - cont statin  8. Primary osteoarthritis involving multiple joints - increased pain to neck, hands, and knee - start tylenol 1000 mg po bid   9. Acquired hypothyroidism - TSH 0.32 - synthroid reduced to 100 mcg - TSH/ T4 in 4 weeks    Family/ staff Communication: plan discussed with patient, granddaughter, and nurse  Labs/tests ordered:  cbc/diff, bmp in 3 days/ TSH and T4 in 4 weeks

## 2021-07-22 ENCOUNTER — Encounter: Payer: Self-pay | Admitting: Nurse Practitioner

## 2021-07-22 ENCOUNTER — Non-Acute Institutional Stay (SKILLED_NURSING_FACILITY): Payer: Medicare Other | Admitting: Nurse Practitioner

## 2021-07-22 DIAGNOSIS — E039 Hypothyroidism, unspecified: Secondary | ICD-10-CM

## 2021-07-22 DIAGNOSIS — E871 Hypo-osmolality and hyponatremia: Secondary | ICD-10-CM | POA: Diagnosis not present

## 2021-07-22 DIAGNOSIS — S12501D Unspecified nondisplaced fracture of sixth cervical vertebra, subsequent encounter for fracture with routine healing: Secondary | ICD-10-CM

## 2021-07-22 DIAGNOSIS — R110 Nausea: Secondary | ICD-10-CM

## 2021-07-22 DIAGNOSIS — I4892 Unspecified atrial flutter: Secondary | ICD-10-CM

## 2021-07-22 DIAGNOSIS — M1711 Unilateral primary osteoarthritis, right knee: Secondary | ICD-10-CM

## 2021-07-22 DIAGNOSIS — I10 Essential (primary) hypertension: Secondary | ICD-10-CM

## 2021-07-22 DIAGNOSIS — I5033 Acute on chronic diastolic (congestive) heart failure: Secondary | ICD-10-CM

## 2021-07-22 DIAGNOSIS — J9601 Acute respiratory failure with hypoxia: Secondary | ICD-10-CM | POA: Diagnosis not present

## 2021-07-22 NOTE — Assessment & Plan Note (Signed)
EF 60-65%, DOE, on O2 2lpm, completed 4 day course of Furosemide '20mg'$  Kcl 71mq qd  ?

## 2021-07-22 NOTE — Assessment & Plan Note (Signed)
Blood pressure is controlled, on Coreg, Bun/creat 50/0.56 07/18/21 ?

## 2021-07-22 NOTE — Assessment & Plan Note (Signed)
weaning off O2, on Spirometer, pending Pulmonology f/u ?

## 2021-07-22 NOTE — Assessment & Plan Note (Signed)
Will have prn Zofran available to her, adding Omeprazole '20mg'$  qd, pending CBC, BMP ?

## 2021-07-22 NOTE — Assessment & Plan Note (Signed)
SIADH, fluid restriction 1200cc/day, pending BMP ?

## 2021-07-22 NOTE — Assessment & Plan Note (Signed)
Heart rate is controlled, on Diltiazem, off anticoagulation due to fall ?

## 2021-07-22 NOTE — Assessment & Plan Note (Signed)
multiple sites, neck, hands, knees, on Tylenol ?

## 2021-07-22 NOTE — Assessment & Plan Note (Signed)
TSH 0.32, on Levothyroxine, pending TSH in 4 weeks.  ?

## 2021-07-22 NOTE — Assessment & Plan Note (Signed)
closed nondisplaced fx, C collar ?

## 2021-07-22 NOTE — Progress Notes (Unsigned)
Location:   S[facility] Room Number: 78 Place of Service:  SNF (31) Provider: [facility] Kuba Shepherd NP  Virgie Dad, MD  Patient Care Team: Virgie Dad, MD as PCP - General (Internal Medicine) Skeet Latch, MD as PCP - Cardiology (Cardiology)  Extended Emergency Contact Information Primary Emergency Contact: [name],[name] Mobile Phone: [phone] Relation: Daughter Secondary Emergency Contact: Kalt,James Address: [address] [place], [place] [address] [place] Phone: [phone] Mobile Phone: [phone] Relation: Spouse  Code Status: DNR Goals of care: Advanced Directive information Advanced Directives 07/19/2021  Does Patient Have a Medical Advance Directive? No  Type of Advance Directive -  Does patient want to make changes to medical advance directive? No - Patient declined  Copy of [employer] in Chart? -  Would patient like information on creating a medical advance directive? -     Chief Complaint  Patient presents with   Acute Visit    C/o nausea    HPI:  Pt is a 88 y.o. female seen today for an acute visit for c/o nausea, no vomiting, oral intake is poor today, denied abd pain.   CHF, EF 60-65%, DOE, on O2 2lpm, completed 4 day course of Furosemide '20mg'$  Kcl 45mq qd   Hypoxemia, weaning off O2, on Spirometer, pending Pulmonology f/u  Hyponatremia, SIADH, fluid restriction 1200cc/day, pending BMP  C6-7 closed nondisplaced fx, C collar  HTN, on Coreg, Bun/creat 50/0.56 07/18/21  Aflutter, on Diltiazem, off anticoagulation due to fall  OA multiple sites, neck, hands, knees, on Tylenol  Hypothyroidism, TSH 0.32, on Levothyroxine, pending TSH in 4 weeks.    Past Medical History:  Diagnosis Date   Arthritis    oa   Cancer ([facility]    basal cell skin cancer, multiple areas removed, thryoid   Central retinal artery occlusion 07/07/2019   DJD (degenerative joint disease)    Essential  hypertension 07/07/2019   GERD (gastroesophageal reflux disease)    takes prilosec    Hypertension    Hypothyroidism since 2001   Osteoporosis    Polymyalgia rheumatica ([facility]    Pure hypercholesterolemia 07/07/2019   Sleep apnea    uses mouthpiece somnodent, pt wishes to leave mouthpiece at home, pt told moderate severe sleep apnea   Thyroid disease    Vaginal delivery 1958, [id number]  Past Surgical History:  Procedure Laterality Date   ANTERIOR AND POSTERIOR REPAIR N/A 11/02/2014   Procedure: ANTERIOR (CYSTOCELE) Repair AND Possible POSTERIOR REPAIR (RECTOCELE);  Surgeon: MPrincess Bruins MD;  [facility];  Service: Gynecology;  Laterality: N/A;   BUBBLE STUDY  07/28/2019   Procedure: BUBBLE STUDY;  Surgeon: AElouise Munroe MD;  [place] [facility]  Service: Cardiovascular;;   CYSTOSCOPY N/A 11/02/2014   Procedure: CConsuela Mimes  Surgeon: MPrincess Bruins MD;  [facility];  Service: Gynecology;  Laterality: N/A;   DILATION AND CURETTAGE OF UTERUS     EYE SURGERY  2007   both eyes lens replacements for cataracts   LAMINECTOMY     ROBOTIC ASSISTED LAPAROSCOPIC SACROCOLPOPEXY N/A 11/02/2014   Procedure: ROBOTIC ASSISTED LAPAROSCOPIC Uterosacral ligament suspension for SACROCOLPOPEXY;  Surgeon: MPrincess Bruins MD;  [facility];  Service: Gynecology;  Laterality: N/A;   ROBOTIC ASSISTED TOTAL HYSTERECTOMY WITH BILATERAL SALPINGO OOPHERECTOMY Bilateral 11/02/2014   Procedure: ROBOTIC ASSISTED TOTAL HYSTERECTOMY WITH BILATERAL SALPINGO OOPHORECTOMY;  Surgeon: MPrincess Bruins MD;  [facility];  Service: Gynecology;  Laterality: Bilateral;   THYROIDECTOMY  2001   and radiation done also, partial   TONSILLECTOMY     TOTAL KNEE ARTHROPLASTY Right 04/25/2014   Procedure: RIGHT TOTAL KNEE ARTHROPLASTY;  Surgeon: Gearlean Alf, MD;  [facility];  Service: Orthopedics;  Laterality: Right;   TUBAL LIGATION      Allergies  Allergen Reactions   Oxycodone  Other (See Comments)    Makes me crazy   Celebrex [Celecoxib] Itching and Rash    Had itching, burning and rash to it. Likely adverse effect rather than allergic.   Latex Rash    Allergies as of 07/22/2021       Reactions   Oxycodone Other (See Comments)   Makes me crazy   Celebrex [celecoxib] Itching, Rash   Had itching, burning and rash to it. Likely adverse effect rather than allergic.   Latex Rash        Medication List        Accurate as of July 22, 2021  4:26 PM. If you have any questions, ask your nurse or doctor.          acetaminophen 325 MG tablet Commonly known as: TYLENOL Take 2 tablets (650 mg total) by mouth every 6 (six) hours as needed for mild pain (or Fever >/= 101).   bisacodyl 5 MG EC tablet Commonly known as: DULCOLAX Take 1 tablet (5 mg total) by mouth daily as needed for moderate constipation.   CALCIUM 500/D PO Take 200 Units by mouth in the morning and at bedtime.   carvedilol 12.5 MG tablet Commonly known as: COREG Take 1 tablet (12.5 mg total) by mouth 2 (two) times daily with a meal.   cholecalciferol 25 MCG (1000 UNIT) tablet Commonly known as: VITAMIN D3 Take 1,000 Units by mouth daily.   diltiazem 180 MG 24 hr capsule Commonly known as: CARDIZEM CD Take 1 capsule (180 mg total) by mouth every morning.   docusate sodium 100 MG capsule Commonly known as: COLACE Take 100 mg by mouth daily as needed for mild constipation.   feeding supplement Liqd Take 237 mLs by mouth 2 (two) times daily between meals.   FISH OIL PO Take 2,000 mg by mouth daily.   levothyroxine 100 MCG tablet Commonly known as: SYNTHROID Take 1 tablet (100 mcg total) by mouth daily before breakfast. Please take on empty stomach every Monday, Tuesday, Wednesday, Thursday and Friday.   LUBRICATING EYE DROPS OP Place 1 drop into both eyes daily as needed (dry eyes).   Melatonin 10 MG Tabs Take 10 mg by mouth at bedtime as needed.   omeprazole 20 MG  capsule Commonly known as: PRILOSEC Take 20 mg by mouth every morning.   rosuvastatin 20 MG tablet Commonly known as: CRESTOR Take 1 tablet (20 mg total) by mouth daily.   Vitamin C 500 MG Caps Take 500 mg by mouth daily.        Review of Systems  Constitutional:  Positive for appetite change and fatigue. Negative for fever.  HENT:  Positive for hearing loss. Negative for congestion and trouble swallowing.   Eyes:  Negative for visual disturbance.  Respiratory:  Positive for cough and shortness of breath. Negative for wheezing.        DOE  Gastrointestinal:  Positive for nausea. Negative for abdominal pain, constipation and vomiting.  Genitourinary:  Negative for dysuria, frequency and urgency.  Musculoskeletal:  Positive for arthralgias, gait problem and neck pain.  Skin:  Negative for color  change.  Neurological:  Negative for dizziness, syncope, weakness and headaches.       Memory lapses.   Psychiatric/Behavioral:  Negative for behavioral problems and sleep disturbance. The patient is not nervous/anxious.    Immunization History  Administered Date(s) Administered   Influenza Split 02/26/2010, 03/03/2011, 03/12/2011, 03/02/2012, 05/12/2012, 02/14/2021   Moderna Covid-19 Vaccine Bivalent Booster 88yr & up 02/07/2021   Moderna SARS-COV2 Booster Vaccination 03/26/2020, 10/17/2020   Moderna Sars-Covid-2 Vaccination 05/16/2019, 06/13/2019   Zoster Recombinat (Shingrix) 02/11/2020   Pertinent  Health Maintenance Due  Topic Date Due   INFLUENZA VACCINE  Completed   DEXA SCAN  Completed   Fall Risk 07/15/2021 07/16/2021 07/16/2021 07/17/2021 07/18/2021  Patient Fall Risk Level High fall risk High fall risk High fall risk High fall risk High fall risk   Functional Status Survey:    Vitals:   07/22/21 1510  BP: 114/85  Pulse: 99  Resp: 18  Temp: (!) 97.1 F (36.2 C)  SpO2: 95%   There is no height or weight on file to calculate BMI. Physical Exam Vitals and nursing note  reviewed.  Constitutional:      Appearance: Normal appearance.  HENT:     Head: Normocephalic and atraumatic.     Nose: Nose normal.     Mouth/Throat:     Mouth: Mucous membranes are moist.  Eyes:     Extraocular Movements: Extraocular movements intact.     Conjunctiva/sclera: Conjunctivae normal.     Pupils: Pupils are equal, round, and reactive to light.  Neck:     Comments: C6 fx, C-collar.  Cardiovascular:     Rate and Rhythm: Normal rate and regular rhythm.     Heart sounds: No murmur heard. Pulmonary:     Effort: Pulmonary effort is normal.     Breath sounds: Rales present. No wheezing or rhonchi.     Comments: Bibasilar rales.  Abdominal:     General: Bowel sounds are normal.     Palpations: Abdomen is soft.     Tenderness: There is no abdominal tenderness. There is no right CVA tenderness, left CVA tenderness, guarding or rebound.  Musculoskeletal:        General: Tenderness present.     Cervical back: Tenderness present.     Right lower leg: No edema.     Left lower leg: No edema.     Comments: neck  Skin:    General: Skin is warm and dry.  Neurological:     General: No focal deficit present.     Mental Status: She is alert and oriented to person, place, and time. Mental status is at baseline.     Motor: No weakness.     Coordination: Coordination normal.     Gait: Gait abnormal.  Psychiatric:        Mood and Affect: Mood normal.        Behavior: Behavior normal.        Thought Content: Thought content normal.    Labs reviewed: Recent Labs    07/14/21 2359 07/15/21 0751 07/16/21 0352 07/16/21 0742 07/16/21 1613 07/17/21 0428 07/18/21 0250  NA 123*   < > 126*   < > 126* 127* 129*  K 3.7  --  3.9  --  3.6 3.2* 5.2*  CL 88*  --  89*  --  91* 88* 92*  CO2 27  --  27  --  '25 30 28  '$ GLUCOSE 114*  --  99  --  145* 106*  105*  BUN 45*  --  40*  --  61* 49* 50*  CREATININE 0.59  --  0.56  --  1.04* 0.68 0.56  CALCIUM 8.2*  --  8.9  --  8.9 8.9 9.6  MG 2.0   --  2.0  --   --   --  2.2  PHOS  --   --   --   --   --  4.8*  --    < > = values in this interval not displayed.   Recent Labs    07/14/21 0420 07/14/21 2359 07/16/21 0352  AST '17 24 26  '$ ALT 25 29 32  ALKPHOS 86 92 93  BILITOT 0.5 0.5 1.1  PROT 5.0* 5.1* 5.4*  ALBUMIN 2.8* 2.8* 2.9*   Recent Labs    07/14/21 0420 07/14/21 2359 07/16/21 0352 07/17/21 0428 07/18/21 0250  WBC 12.2* 11.0* 11.6* 12.7* 11.8*  NEUTROABS 10.7* 9.5* 9.8*  --   --   HGB 12.0 11.8* 12.7 13.2 12.3  HCT 32.4* 32.6* 34.5* 35.8* 35.3*  MCV 89.5 91.8 90.8 92.3 94.4  PLT 240 235 249 289 267   Lab Results  Component Value Date   TSH 0.329 (L) 07/14/2021   No results found for: HGBA1C No results found for: CHOL, HDL, LDLCALC, LDLDIRECT, TRIG, CHOLHDL  Significant Diagnostic Results in last 30 days:  DG Chest 2 View  Result Date: 07/12/2021 CLINICAL DATA:  Pt brought from home for c/o intermittent AMS, increased general weakness and decreased mobility. Pt has non productive cough. Pt denies any CP but states she has been feeling SOB x a few days Nonsmoker Hx of HTN, cancer, GERD, EXAM: CHEST - 2 VIEW COMPARISON:  04/19/2014 FINDINGS: Progressive development of coarse peripheral interstitial opacities, worse left than right, involving bases more than apices. Heart size upper limits normal.  Tortuous ectatic thoracic aorta. No effusion. Levoscoliosis in the upper thoracic spine with suspected vertebral segmentation anomaly, incompletely characterized. IMPRESSION: 1. Coarse asymmetric interstitial opacities left greater than right, suggesting progressive chronic interstitial lung disease. Cannot exclude superimposed interstitial infiltrate or asymmetric edema. Electronically Signed   By: Lucrezia Europe M.D.   On: 07/12/2021 10:10   DG Lumbar Spine Complete  Result Date: 07/03/2021 CLINICAL DATA:  Status post fall with lower back pain. EXAM: LUMBAR SPINE - COMPLETE 4+ VIEW COMPARISON:  February 25, 2021 FINDINGS:  There is no evidence of acute lumbar spine fracture. Mild dextroscoliosis is seen with stable grade 1 anterolisthesis of the L4 vertebral body on L5. Mild to moderate severity multilevel endplate sclerosis is seen. Stable marked severity intervertebral disc space narrowing is noted at the level of L4-L5. IMPRESSION: 1. No evidence of acute lumbar spine fracture. 2. Stable marked severity degenerative disc disease at L4-L5 with stable grade 1 anterolisthesis of the L4 vertebral body. Electronically Signed   By: Virgina Norfolk M.D.   On: 07/03/2021 02:47   CT HEAD WO CONTRAST (5MM)  Result Date: 07/18/2021 CLINICAL DATA:  Altered mental status.  Fall. EXAM: CT HEAD WITHOUT CONTRAST TECHNIQUE: Contiguous axial images were obtained from the base of the skull through the vertex without intravenous contrast. RADIATION DOSE REDUCTION: This exam was performed according to the departmental dose-optimization program which includes automated exposure control, adjustment of the mA and/or kV according to patient size and/or use of iterative reconstruction technique. COMPARISON:  07/04/2021 FINDINGS: Brain: Expected cerebral and cerebellar atrophy for age. Moderate low density in the periventricular white matter likely related to small vessel  disease. No mass lesion, hemorrhage, hydrocephalus, acute infarct, intra-axial, or extra-axial fluid collection. Vascular: No hyperdense vessel or unexpected calcification. Skull: Decrease in left parietooccipital scalp soft tissue swelling. No skull fracture. Sinuses/Orbits: Normal imaged portions of the orbits and globes. Clear paranasal sinuses and mastoid air cells. Other: None. IMPRESSION: 1.  No acute intracranial abnormality. 2.  Cerebral atrophy and small vessel ischemic change. 3. Improvement in scalp soft tissue swelling. Electronically Signed   By: Abigail Miyamoto M.D.   On: 07/18/2021 13:10   CT Head Wo Contrast  Result Date: 07/04/2021 CLINICAL DATA:  Trauma EXAM: CT HEAD  WITHOUT CONTRAST TECHNIQUE: Contiguous axial images were obtained from the base of the skull through the vertex without intravenous contrast. RADIATION DOSE REDUCTION: This exam was performed according to the departmental dose-optimization program which includes automated exposure control, adjustment of the mA and/or kV according to patient size and/or use of iterative reconstruction technique. COMPARISON:  07/03/2021 FINDINGS: Brain: No acute intracranial findings are seen. There are no signs of intracranial bleeding. There are no epidural or subdural fluid collections. Cortical sulci are prominent. There is decreased density in the periventricular white matter. Vascular: Unremarkable. Skull: No fracture is seen in the calvarium. There is subcutaneous contusion/hematoma in the posterior parietal scalp. Sinuses/Orbits: There are no air-fluid levels. Other: None IMPRESSION: No acute intracranial findings are seen in noncontrast CT brain. There is subcutaneous contusion/hematoma in the parietal scalp. No fracture is seen in the calvarium. Electronically Signed   By: Elmer Picker M.D.   On: 07/04/2021 17:47   CT Head Wo Contrast  Result Date: 07/03/2021 CLINICAL DATA:  Recent slip and fall with headaches and neck pain, initial encounter EXAM: CT HEAD WITHOUT CONTRAST CT CERVICAL SPINE WITHOUT CONTRAST TECHNIQUE: Multidetector CT imaging of the head and cervical spine was performed following the standard protocol without intravenous contrast. Multiplanar CT image reconstructions of the cervical spine were also generated. RADIATION DOSE REDUCTION: This exam was performed according to the departmental dose-optimization program which includes automated exposure control, adjustment of the mA and/or kV according to patient size and/or use of iterative reconstruction technique. COMPARISON:  04/18/2019 FINDINGS: CT HEAD FINDINGS Brain: No evidence of acute infarction, hemorrhage, hydrocephalus, extra-axial  collection or mass lesion/mass effect. Chronic atrophic and ischemic changes are noted. Vascular: No hyperdense vessel or unexpected calcification. Skull: Normal. Negative for fracture or focal lesion. Sinuses/Orbits: No acute finding. Other: None. CT CERVICAL SPINE FINDINGS Alignment: Loss of the normal cervical lordosis is noted. Anterolisthesis of C4 on C5 is seen felt to be degenerative in nature. Skull base and vertebrae: 7 cervical segments are well visualized. Vertebral body height is well maintained. At least partial fusion at C5-6 is noted. Anterolisthesis of C4 on C5 is noted of a degenerative nature. No acute fracture or acute facet abnormality is noted. Multilevel facet hypertrophic changes are seen. Significant scoliosis in the upper thoracic spine is noted concave to the left. There are findings suspicious for hemi vertebra between C6 and T1 vertebra on the left. Soft tissues and spinal canal: Surrounding soft tissue structures show vascular calcifications. Upper chest: Visualized lung apices are within normal limits. Other: None IMPRESSION: CT of the head: Chronic atrophic and ischemic changes. CT of the cervical spine: Multilevel degenerative change. No acute abnormality noted. 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 Electronically Signed   By: Inez Catalina M.D.   On: 07/03/2021 04:02   CT CHEST WO CONTRAST  Result Date: 07/14/2021 CLINICAL DATA:  Cough for greater  than 8 weeks. Progressive weakness. EXAM: CT CHEST WITHOUT CONTRAST TECHNIQUE: Multidetector CT imaging of the chest was performed following the standard protocol without IV contrast. RADIATION DOSE REDUCTION: This exam was performed according to the departmental dose-optimization program which includes automated exposure control, adjustment of the mA and/or kV according to patient size and/or use of iterative reconstruction technique. COMPARISON:  None. FINDINGS: Cardiovascular: Normal heart size. No pericardial effusion. Aortic  atherosclerosis and coronary artery calcifications. Mediastinum/Nodes: No enlarged mediastinal or axillary lymph nodes. Thyroid gland, trachea, and esophagus demonstrate no significant findings. Lungs/Pleura: No pleural effusion, airspace consolidation, atelectasis, or pneumothorax. No interstitial edema. Signs of chronic interstitial lung disease identified with peripheral predominant subpleural reticulation, traction bronchiectasis and multifocal patchy areas of ground-glass attenuation. Thickening of the peribronchovascular interstitium is also identified. No frank honeycombing noted. Calcified granuloma is noted within the left upper lobe. Upper Abdomen: No acute findings within the imaged portions of the upper abdomen. Subcapsular cyst within medial segment of left hepatic lobe measures 9 mm. Aortic atherosclerotic calcifications. Musculoskeletal: Thoracolumbar scoliosis deformity with mild degenerative disc disease. No acute or suspicious osseous findings. IMPRESSION: 1. No acute cardiopulmonary abnormality. 2. Signs of chronic interstitial lung disease including peripheral subpleural reticulation, traction bronchiectasis and multifocal areas of ground-glass attenuation. Recommend follow-up imaging with high-resolution CT of the chest in 6 months to assess for temporal change in the appearance of the lungs. 3. Thoracolumbar scoliosis and degenerative disc disease. 4. Aortic Atherosclerosis (ICD10-I70.0). Electronically Signed   By: Kerby Moors M.D.   On: 07/14/2021 14:38   CT Cervical Spine Wo Contrast  Addendum Date: 07/04/2021   ADDENDUM REPORT: 07/04/2021 17:44 ADDENDUM: Imaging findings were relayed to patient's provider Theodis Blaze by telephone call. Electronically Signed   By: Elmer Picker M.D.   On: 07/04/2021 17:44   Result Date: 07/04/2021 CLINICAL DATA:  Trauma EXAM: CT CERVICAL SPINE WITHOUT CONTRAST TECHNIQUE: Multidetector CT imaging of the cervical spine was performed without  intravenous contrast. Multiplanar CT image reconstructions were also generated. RADIATION DOSE REDUCTION: This exam was performed according to the departmental dose-optimization program which includes automated exposure control, adjustment of the mA and/or kV according to patient size and/or use of iterative reconstruction technique. COMPARISON:  07/03/2021 FINDINGS: Alignment: There is minimal anterolisthesis at C4-C5 level suggesting previous ligament injury and facet degeneration. There is congenital partial fusion of bodies of C5 and C6 vertebrae. There is a radiolucent line in the right lamina of C6 vertebra. There is radiolucent line in the left anterior superior C7 vertebra. There is congenital partial fusion of bodies of T1 and T2 vertebrae. 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 Skull base and vertebrae: Degenerative changes are noted with bony spurs and facet hypertrophy at multiple levels in the cervical spine. Soft tissues and spinal canal: There is no significant central spinal stenosis. Disc levels: There is encroachment of neural foramina from C2-C7 levels. Upper chest: Unremarkable. Other: Thyroid is not seen suggesting atrophy or previous removal. IMPRESSION: There is radiolucent line in the right lamina of C6 vertebra. There is radiolucent line in the anterior superior left side body C7 vertebra. Possibility of recent fractures is not excluded. Follow-up MRI as clinically warranted should be considered. Cervical spondylosis with encroachment of neural foramina at multiple levels. Congenital deformity is seen in the lower cervical and upper thoracic spine. Other findings as described in the body of the report. Electronically Signed: By: Elmer Picker M.D. On: 07/04/2021 17:40   CT  Cervical Spine Wo Contrast  Result Date: 07/03/2021 CLINICAL DATA:  Recent slip and fall with headaches and neck pain, initial encounter EXAM: CT HEAD WITHOUT CONTRAST CT  CERVICAL SPINE WITHOUT CONTRAST TECHNIQUE: Multidetector CT imaging of the head and cervical spine was performed following the standard protocol without intravenous contrast. Multiplanar CT image reconstructions of the cervical spine were also generated. RADIATION DOSE REDUCTION: This exam was performed according to the departmental dose-optimization program which includes automated exposure control, adjustment of the mA and/or kV according to patient size and/or use of iterative reconstruction technique. COMPARISON:  04/18/2019 FINDINGS: CT HEAD FINDINGS Brain: No evidence of acute infarction, hemorrhage, hydrocephalus, extra-axial collection or mass lesion/mass effect. Chronic atrophic and ischemic changes are noted. Vascular: No hyperdense vessel or unexpected calcification. Skull: Normal. Negative for fracture or focal lesion. Sinuses/Orbits: No acute finding. Other: None. CT CERVICAL SPINE FINDINGS Alignment: Loss of the normal cervical lordosis is noted. Anterolisthesis of C4 on C5 is seen felt to be degenerative in nature. Skull base and vertebrae: 7 cervical segments are well visualized. Vertebral body height is well maintained. At least partial fusion at C5-6 is noted. Anterolisthesis of C4 on C5 is noted of a degenerative nature. No acute fracture or acute facet abnormality is noted. Multilevel facet hypertrophic changes are seen. Significant scoliosis in the upper thoracic spine is noted concave to the left. There are findings suspicious for hemi vertebra between C6 and T1 vertebra on the left. Soft tissues and spinal canal: Surrounding soft tissue structures show vascular calcifications. Upper chest: Visualized lung apices are within normal limits. Other: None IMPRESSION: CT of the head: Chronic atrophic and ischemic changes. CT of the cervical spine: Multilevel degenerative change. No acute abnormality noted. 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 Electronically Signed   By: Inez Catalina M.D.   On:  07/03/2021 04:02   MR CERVICAL SPINE WO CONTRAST  Result Date: 07/10/2021 CLINICAL DATA:  Cervicalgia EXAM: MRI CERVICAL SPINE WITHOUT CONTRAST TECHNIQUE: Multiplanar, multisequence MR imaging of the cervical spine was performed. No intravenous contrast was administered. COMPARISON:  Cervical spine CT 07/04/2021 FINDINGS: Alignment: Segmentation anomalies at the cervicothoracic junction contribute to severe levoscoliosis of the upper thoracic spine. Grade 1 anterolisthesis at C3-4 and C4-5. Vertebrae: There is edema at the superior C7 endplate in the region of the fracture identified on the earlier CT. There is also edema at the site of the C6 spinous process fracture. Assessment of the bones is otherwise limited by the degree of motion. Cord: Assessment severely limited by motion. Posterior Fossa, vertebral arteries, paraspinal tissues: Negative. Disc levels: C1-2: Unremarkable. C2-3: No disc herniation. There is no spinal canal stenosis. No neural foraminal stenosis. C3-4: Facet arthrosis, left-greater-than-right, and left foraminal disc protrusion. There is no spinal canal stenosis. Severe left neural foraminal stenosis. C4-5: Severe left facet arthrosis. There is no spinal canal stenosis. Severe left neural foraminal stenosis. C5-6: No disc herniation, spinal canal stenosis or neural impingement C6-7: No disc herniation, spinal canal stenosis or neural impingement . C7-T1: No disc herniation, spinal canal stenosis or neural impingement IMPRESSION: 1. Severely motion degraded study. 2. Edema at the site of the superior C7 endplate fracture and C6 spinous process fracture. 3. Severe left C3-4 and C4-5 neural foraminal stenosis secondary to combination of facet arthrosis and left foraminal disc protrusion. 4. Cervicothoracic segmentation anomalies with marked scoliosis. Electronically Signed   By: Ulyses Jarred M.D.   On: 07/10/2021 21:34   DG Chest [facility] 1 View  Result  Date: 07/13/2021 CLINICAL DATA:   88 year old female with history of shortness of breath. EXAM: PORTABLE CHEST 1 VIEW COMPARISON:  Chest x-ray 07/12/2021. FINDINGS: Lung volumes are low. No consolidative airspace disease. No pleural effusions. Widespread interstitial prominence, most evident throughout the mid to lower lungs bilaterally, similar to the prior examination no pneumothorax. No evidence of pulmonary edema. Heart size is normal. Numerous calcified mediastinal and bilateral hilar lymph nodes are incidentally noted. Upper mediastinal contours are distorted by patient positioning. Atherosclerosis in the thoracic aorta. IMPRESSION: 1. Low lung volumes with diffuse interstitial prominence, most evident throughout the mid to lower lungs bilaterally, concerning for interstitial lung disease. Further evaluation with nonemergent high-resolution chest CT is recommended in the near future to better evaluate these findings. 2. Aortic atherosclerosis. Electronically Signed   By: Vinnie Langton M.D.   On: 07/13/2021 06:36   ECHOCARDIOGRAM COMPLETE  Result Date: 07/12/2021    ECHOCARDIOGRAM REPORT   Patient Name:   LASHAE WOLLENBERG Date of Exam: 07/12/2021 Medical Rec #:  [id number]           Height:       61.0 in Accession #:    [id number]          Weight:       125.0 lb Date of Birth:  04-09-1934          BSA:          1.547 m Patient Age:    11 years            BP:           136/84 mmHg Patient Gender: F                   HR:           71 bpm. Exam Location:  Inpatient Procedure: 2D Echo, Cardiac Doppler and Color Doppler Indications:    CHF  History:        Patient has prior history of Echocardiogram examinations, most                 recent 07/28/2019. CHF; Risk Factors:Hypertension and                 Dyslipidemia.  Sonographer:    Luisa Hart RDCS Referring Phys: [id number] Margaree Mackintosh [place]  1. Prominent basal septal hypertrophy and hypertrophied papilary muscles no mid cavitary gradient recoreded No SAM . Left ventricular ejection  fraction, by estimation, is 60 to 65%. The left ventricle has normal function. The left ventricle has no regional wall motion abnormalities. There is severe asymmetric left ventricular hypertrophy of the basal and septal segments. Left ventricular diastolic parameters are consistent with Grade II diastolic dysfunction (pseudonormalization). Elevated left ventricular end-diastolic pressure.  2. Right ventricular systolic function is normal. The right ventricular size is normal.  3. The pericardial effusion is posterior to the left ventricle.  4. The mitral valve is abnormal. Trivial mitral valve regurgitation. No evidence of mitral stenosis.  5. Small gradient across AV but morphologically opens well . The aortic valve is tricuspid. There is mild calcification of the aortic valve. There is mild thickening of the aortic valve. Aortic valve regurgitation is mild. Aortic valve sclerosis is present, with no evidence of aortic valve stenosis.  6. The inferior vena cava is normal in size with greater than 50% respiratory variability, suggesting right atrial pressure of 3 mmHg. FINDINGS  Left Ventricle: Prominent basal septal hypertrophy and hypertrophied papilary muscles no mid  cavitary gradient recoreded No SAM. Left ventricular ejection fraction, by estimation, is 60 to 65%. The left ventricle has normal function. The left ventricle has no regional wall motion abnormalities. The left ventricular internal cavity size was normal in size. There is severe asymmetric left ventricular hypertrophy of the basal and septal segments. Left ventricular diastolic parameters are consistent with Grade II diastolic dysfunction (pseudonormalization). Elevated left ventricular end-diastolic pressure. Right Ventricle: The right ventricular size is normal. No increase in right ventricular wall thickness. Right ventricular systolic function is normal. Left Atrium: Left atrial size was normal in size. Right Atrium: Right atrial size was  normal in size. Pericardium: Trivial pericardial effusion is present. The pericardial effusion is posterior to the left ventricle. Mitral Valve: The mitral valve is abnormal. There is mild thickening of the mitral valve leaflet(s). There is moderate calcification of the mitral valve leaflet(s). Mild mitral annular calcification. Trivial mitral valve regurgitation. No evidence of mitral valve stenosis. MV peak gradient, 4.4 mmHg. The mean mitral valve gradient is 2.0 mmHg. Tricuspid Valve: The tricuspid valve is normal in structure. Tricuspid valve regurgitation is trivial. No evidence of tricuspid stenosis. Aortic Valve: Small gradient across AV but morphologically opens well. The aortic valve is tricuspid. There is mild calcification of the aortic valve. There is mild thickening of the aortic valve. Aortic valve regurgitation is mild. Aortic regurgitation PHT measures 565 msec. Aortic valve sclerosis is present, with no evidence of aortic valve stenosis. Aortic valve mean gradient measures 7.0 mmHg. Aortic valve peak gradient measures 13.3 mmHg. Aortic valve area, by VTI measures 1.78 cm. Pulmonic Valve: The pulmonic valve was normal in structure. Pulmonic valve regurgitation is not visualized. No evidence of pulmonic stenosis. Aorta: The aortic root is normal in size and structure. Venous: The inferior vena cava is normal in size with greater than 50% respiratory variability, suggesting right atrial pressure of 3 mmHg. IAS/Shunts: No atrial level shunt detected by color flow Doppler.  LEFT VENTRICLE PLAX 2D LVIDd:         3.00 cm     Diastology LVIDs:         1.40 cm     LV e' medial:    4.43 cm/s LV PW:         1.00 cm     LV E/e' medial:  17.2 LV IVS:        1.80 cm     LV e' lateral:   6.30 cm/s LVOT diam:     1.80 cm     LV E/e' lateral: 12.1 LV SV:         57 LV SV Index:   37 LVOT Area:     2.54 cm  LV Volumes (MOD) LV vol d, MOD A2C: 29.0 ml LV vol d, MOD A4C: 22.0 ml LV vol s, MOD A2C: 7.9 ml LV vol s,  MOD A4C: 4.8 ml LV SV MOD A2C:     21.1 ml LV SV MOD A4C:     22.0 ml LV SV MOD BP:      20.8 ml RIGHT VENTRICLE RV Basal diam:  2.60 cm RV Mid diam:    2.60 cm RV S prime:     13.40 cm/s TAPSE (M-mode): 1.6 cm LEFT ATRIUM             Index        RIGHT ATRIUM          Index LA diam:        3.20 cm  2.07 cm/m   RA Area:     8.13 cm LA Vol (A2C):   40.2 ml 25.99 ml/m  RA Volume:   12.60 ml 8.15 ml/m LA Vol (A4C):   39.1 ml 25.28 ml/m LA Biplane Vol: 41.9 ml 27.09 ml/m  AORTIC VALVE                     PULMONIC VALVE AV Area (Vmax):    1.76 cm      PV Vmax:       1.00 m/s AV Area (Vmean):   1.76 cm      PV Vmean:      65.550 cm/s AV Area (VTI):     1.78 cm      PV VTI:        0.196 m AV Vmax:           182.50 cm/s   PV Peak grad:  4.0 mmHg AV Vmean:          120.000 cm/s  PV Mean grad:  2.5 mmHg AV VTI:            0.322 m AV Peak Grad:      13.3 mmHg AV Mean Grad:      7.0 mmHg LVOT Vmax:         126.00 cm/s LVOT Vmean:        83.200 cm/s LVOT VTI:          0.225 m LVOT/AV VTI ratio: 0.70 AI PHT:            565 msec  AORTA Ao Root diam: 2.60 cm Ao Asc diam:  3.20 cm MITRAL VALVE               TRICUSPID VALVE MV Area (PHT): 3.42 cm    TR Peak grad:   33.9 mmHg MV Area VTI:   1.90 cm    TR Vmax:        291.00 cm/s MV Peak grad:  4.4 mmHg MV Mean grad:  2.0 mmHg    SHUNTS MV Vmax:       1.05 m/s    Systemic VTI:  0.22 m MV Vmean:      60.5 cm/s   Systemic Diam: 1.80 cm MV Decel Time: 222 msec MV E velocity: 76.20 cm/s MV A velocity: 82.50 cm/s MV E/A ratio:  0.92 Jenkins Rouge MD Electronically signed by Jenkins Rouge MD Signature Date/Time: 07/12/2021/2:40:27 PM    Final    [place] EKG SITE RITE  Result Date: 07/17/2021 If Site Rite image not attached, placement could not be confirmed due to current cardiac rhythm.   Assessment/Plan: Nausea Will have prn Zofran available to her, adding Omeprazole '20mg'$  qd, pending CBC, BMP  Acute on chronic diastolic CHF (congestive heart failure) (HCC) EF 60-65%, DOE, on O2  2lpm, completed 4 day course of Furosemide '20mg'$  Kcl 27mq qd   Acute respiratory failure with hypoxia (HCC) weaning off O2, on Spirometer, pending Pulmonology f/u  Hyponatremia  SIADH, fluid restriction 1200cc/day, pending BMP  Closed C6 fracture (HCC)  closed nondisplaced fx, C collar  Essential hypertension Blood pressure is controlled, on Coreg, Bun/creat 50/0.56 07/18/21  Atrial flutter (HCC) Heart rate is controlled, on Diltiazem, off anticoagulation due to fall  OA (osteoarthritis) of knee  multiple sites, neck, hands, knees, on Tylenol  Hypothyroidism TSH 0.32, on Levothyroxine, pending TSH in 4 weeks.     Family/ staff Communication: plan of care reviewed with the patient and charge  nurse.   Labs/tests ordered:  none  Time spend 35 minutes.

## 2021-07-23 ENCOUNTER — Encounter: Payer: Self-pay | Admitting: Internal Medicine

## 2021-07-23 ENCOUNTER — Non-Acute Institutional Stay (SKILLED_NURSING_FACILITY): Payer: Medicare Other | Admitting: Internal Medicine

## 2021-07-23 DIAGNOSIS — E039 Hypothyroidism, unspecified: Secondary | ICD-10-CM

## 2021-07-23 DIAGNOSIS — I4892 Unspecified atrial flutter: Secondary | ICD-10-CM

## 2021-07-23 DIAGNOSIS — R052 Subacute cough: Secondary | ICD-10-CM | POA: Diagnosis not present

## 2021-07-23 DIAGNOSIS — R748 Abnormal levels of other serum enzymes: Secondary | ICD-10-CM | POA: Diagnosis not present

## 2021-07-23 DIAGNOSIS — E7800 Pure hypercholesterolemia, unspecified: Secondary | ICD-10-CM

## 2021-07-23 DIAGNOSIS — S12501D Unspecified nondisplaced fracture of sixth cervical vertebra, subsequent encounter for fracture with routine healing: Secondary | ICD-10-CM

## 2021-07-23 DIAGNOSIS — E871 Hypo-osmolality and hyponatremia: Secondary | ICD-10-CM

## 2021-07-23 DIAGNOSIS — I959 Hypotension, unspecified: Secondary | ICD-10-CM | POA: Diagnosis not present

## 2021-07-23 LAB — COMPREHENSIVE METABOLIC PANEL
Albumin: 3 — AB (ref 3.5–5.0)
Calcium: 8.7 (ref 8.7–10.7)
Globulin: 2.7

## 2021-07-23 LAB — BASIC METABOLIC PANEL
BUN: 20 (ref 4–21)
CO2: 21 (ref 13–22)
Chloride: 93 — AB (ref 99–108)
Creatinine: 0.6 (ref 0.5–1.1)
Glucose: 123
Potassium: 4.9 mEq/L (ref 3.5–5.1)
Sodium: 126 — AB (ref 137–147)

## 2021-07-23 LAB — HEPATIC FUNCTION PANEL
ALT: 299 U/L — AB (ref 7–35)
AST: 165 — AB (ref 13–35)
Alkaline Phosphatase: 111 (ref 25–125)
Bilirubin, Total: 0.8

## 2021-07-23 LAB — CBC AND DIFFERENTIAL
HCT: 34 — AB (ref 36–46)
Hemoglobin: 11.5 — AB (ref 12.0–16.0)
Neutrophils Absolute: 11025
Platelets: 464 10*3/uL — AB (ref 150–400)
WBC: 12.5

## 2021-07-23 LAB — CBC: RBC: 3.59 — AB (ref 3.87–5.11)

## 2021-07-23 NOTE — Progress Notes (Signed)
?Provider:  Veleta Miners MD ?Location:   [facility] ?Nursing Home Room Number: 39 ?Place of Service:  SNF (31) ? ?PCP: Virgie Dad, MD ?Patient Care Team: ?Virgie Dad, MD as PCP - General (Internal Medicine) ?Skeet Latch, MD as PCP - Cardiology (Cardiology) ? ?Extended Emergency Contact Information ?Primary Emergency Contact: [name],[name] ?Mobile Phone: [phone] ?Relation: Daughter ?Secondary Emergency Contact: Counsell,James ?Address: [address] ?         [place], [place] [address] [place] ?Home Phone: [phone] ?Mobile Phone: [phone] ?Relation: Spouse ? ?Code Status: Full Code ?Goals of Care: Advanced Directive information ?Advanced Directives 07/23/2021  ?Does Patient Have a Medical Advance Directive? No  ?Type of Advance Directive -  ?Does patient want to make changes to medical advance directive? No - Patient declined  ?Copy of [place] in Chart? -  ?Would patient like information on creating a medical advance directive? -  ? ? ? ? ?Chief Complaint  ?Patient presents with  ? New Admit To SNF  ?  Admission to SNF  ? ? ?HPI: Patient is a 88 y.o. female seen today for admission to SNF ? ?Patient was admitted in the hospital from 3/3 to 3/9 for hyponatremia, CHF and PAF ? ?Patient has a history of hypothyroidism, hypertension, PMR and HLD ? ?Patient has been progressively getting weak in her apartment.  Had 2 falls and was diagnosed with C6-C7 fracture and was placed on 3 top collar.  She also was started on steroid taper ?She went to ED for weakness ?She was found to have a sodium of 114 ?Admitted and Given IV fluids and then needed to be diuresed ? ?Now she is in SNF  ?Continues to feel Weak and Tired ?Also Having Cough and some SOB ?No Fever  ?Some confusion per famiy ? ? ?Past Medical History:  ?Diagnosis Date  ? Arthritis   ? oa  ? Cancer [facility])   ? basal cell skin cancer, multiple areas removed, thryoid  ? Central retinal  artery occlusion 07/07/2019  ? DJD (degenerative joint disease)   ? Essential hypertension 07/07/2019  ? GERD (gastroesophageal reflux disease)   ? takes prilosec   ? Hypertension   ? Hypothyroidism since 2001  ? Osteoporosis   ? Polymyalgia rheumatica ([place])   ? Pure hypercholesterolemia 07/07/2019  ? Sleep apnea   ? uses mouthpiece somnodent, pt wishes to leave mouthpiece at home, pt told moderate severe sleep apnea  ? Thyroid disease   ? Vaginal delivery 1958, [id number]  ? ?Past Surgical History:  ?Procedure Laterality Date  ? ANTERIOR AND POSTERIOR REPAIR N/A 11/02/2014  ? Procedure: ANTERIOR (CYSTOCELE) Repair AND Possible POSTERIOR REPAIR (RECTOCELE);  Surgeon: Princess Bruins, MD;  Location: [facility];  Service: Gynecology;  Laterality: N/A;  ? BUBBLE STUDY  07/28/2019  ? Procedure: BUBBLE STUDY;  Surgeon: [name], MD;  Location: [place];  Service: Cardiovascular;;  ? CYSTOSCOPY N/A 11/02/2014  ? Procedure: CYSTOSCOPY;  Surgeon: Princess Bruins, MD;  Location: [facility];  Service: Gynecology;  Laterality: N/A;  ? DILATION AND CURETTAGE OF UTERUS    ? EYE SURGERY  2007  ? both eyes lens replacements for cataracts  ? LAMINECTOMY    ? ROBOTIC ASSISTED LAPAROSCOPIC SACROCOLPOPEXY N/A 11/02/2014  ? Procedure: ROBOTIC ASSISTED LAPAROSCOPIC Uterosacral ligament suspension for SACROCOLPOPEXY;  Surgeon: Princess Bruins, MD;  Location: [facility];  Service: Gynecology;  Laterality: N/A;  ? ROBOTIC ASSISTED TOTAL HYSTERECTOMY WITH BILATERAL SALPINGO OOPHERECTOMY  Bilateral 11/02/2014  ? Procedure: ROBOTIC ASSISTED TOTAL HYSTERECTOMY WITH BILATERAL SALPINGO OOPHORECTOMY;  Surgeon: Princess Bruins, MD;  Location: [facility];  Service: Gynecology;  Laterality: Bilateral;  ? THYROIDECTOMY  2001  ? and radiation done also, partial  ? TONSILLECTOMY    ? TOTAL KNEE ARTHROPLASTY Right 04/25/2014  ? Procedure: RIGHT TOTAL KNEE ARTHROPLASTY;  Surgeon: Gearlean Alf, MD;  Location: [facility];  Service: Orthopedics;  Laterality:  Right;  ? TUBAL LIGATION    ? ? reports that she has never smoked. She has never used smokeless tobacco. She reports current alcohol use of about 1.0 standard drink per week. She reports that she does not use drugs. ?Social History  ? ?Socioeconomic History  ? Marital status: Married  ?  Spouse name: Not on file  ? Number of children: Not on file  ? Years of education: Not on file  ? Highest education level: Not on file  ?Occupational History  ? Not on file  ?Tobacco Use  ? Smoking status: Never  ? Smokeless tobacco: Never  ?Vaping Use  ? Vaping Use: Never used  ?Substance and Sexual Activity  ? Alcohol use: Yes  ?  Alcohol/week: 1.0 standard drink  ?  Types: 1 Glasses of wine per week  ?  Comment: wine 4 ounces most days  ? Drug use: No  ? Sexual activity: Not on file  ?Other Topics Concern  ? Not on file  ?Social History Narrative  ? Not on file  ? ?Social Determinants of Health  ? ?Financial Resource Strain: Not on file  ?Food Insecurity: Not on file  ?Transportation Needs: Not on file  ?Physical Activity: Not on file  ?Stress: Not on file  ?Social Connections: Not on file  ?Intimate Partner Violence: Not on file  ? ? ?Functional Status Survey: ?  ? ?Family History  ?Problem Relation Age of Onset  ? Breast cancer Daughter   ? ? ?Health Maintenance  ?Topic Date Due  ? Pneumonia Vaccine 58+ Years old (2 - PCV) 05/13/2007  ? TETANUS/TDAP  05/12/2016  ? Zoster Vaccines- Shingrix (2 of 2) 04/07/2020  ? COVID-19 Vaccine (3 - Moderna risk series) 02/07/2021  ? INFLUENZA VACCINE  Completed  ? DEXA SCAN  Completed  ? HPV VACCINES  Aged Out  ? ? ?Allergies  ?Allergen Reactions  ? Oxycodone Other (See Comments)  ?  Makes me crazy  ? Celebrex [Celecoxib] Itching and Rash  ?  Had itching, burning and rash to it. Likely adverse effect rather than allergic.  ? Latex Rash  ? ? ?Allergies as of 07/23/2021   ? ?   Reactions  ? Oxycodone Other (See Comments)  ? Makes me crazy  ? Celebrex [celecoxib] Itching, Rash  ? Had itching,  burning and rash to it. Likely adverse effect rather than allergic.  ? Latex Rash  ? ?  ? ?  ?Medication List  ?  ? ?  ? Accurate as of July 23, 2021  3:23 PM. If you have any questions, ask your nurse or doctor.  ?  ?  ? ?  ? ?acetaminophen 500 MG tablet ?Commonly known as: TYLENOL ?Take 1,000 mg by mouth daily as needed. ?What changed: Another medication with the same name was removed. Continue taking this medication, and follow the directions you see here. ?Changed by: Virgie Dad, MD ?  ?acetaminophen 500 MG tablet ?Commonly known as: TYLENOL ?Take 1,000 mg by mouth in the morning and at bedtime. ?What changed: Another  medication with the same name was removed. Continue taking this medication, and follow the directions you see here. ?Changed by: Virgie Dad, MD ?  ?bisacodyl 5 MG EC tablet ?Commonly known as: DULCOLAX ?Take 1 tablet (5 mg total) by mouth daily as needed for moderate constipation. ?  ?CALCIUM 500/D PO ?Take 200 Units by mouth in the morning and at bedtime. ?  ?carvedilol 12.5 MG tablet ?Commonly known as: COREG ?Take 1 tablet (12.5 mg total) by mouth 2 (two) times daily with a meal. ?  ?cholecalciferol 25 MCG (1000 UNIT) tablet ?Commonly known as: VITAMIN D3 ?Take 1,000 Units by mouth daily. ?  ?diltiazem 180 MG 24 hr capsule ?Commonly known as: CARDIZEM CD ?Take 1 capsule (180 mg total) by mouth every morning. ?  ?docusate sodium 100 MG capsule ?Commonly known as: COLACE ?Take 100 mg by mouth daily as needed for mild constipation. ?  ?feeding supplement Liqd ?Take 237 mLs by mouth 2 (two) times daily between meals. ?  ?FISH OIL PO ?Take 2,000 mg by mouth daily. ?  ?levothyroxine 100 MCG tablet ?Commonly known as: SYNTHROID ?Take 1 tablet (100 mcg total) by mouth daily before breakfast. Please take on empty stomach every Monday, Tuesday, Wednesday, Thursday and Friday. ?  ?LUBRICATING EYE DROPS OP ?Place 1 drop into both eyes daily as needed (dry eyes). ?  ?Melatonin 10 MG Tabs ?Take 10 mg  by mouth at bedtime as needed. ?  ?omeprazole 20 MG capsule ?Commonly known as: PRILOSEC ?Take 20 mg by mouth every morning. ?  ?ondansetron 4 MG tablet ?Commonly known as: ZOFRAN ?Take 4 mg by mouth every

## 2021-07-24 ENCOUNTER — Other Ambulatory Visit: Payer: Self-pay

## 2021-07-24 ENCOUNTER — Inpatient Hospital Stay (HOSPITAL_COMMUNITY)
Admission: EM | Admit: 2021-07-24 | Discharge: 2021-08-10 | DRG: 193 | Disposition: E | Payer: Medicare Other | Source: Skilled Nursing Facility | Attending: Pulmonary Disease | Admitting: Pulmonary Disease

## 2021-07-24 ENCOUNTER — Encounter (HOSPITAL_COMMUNITY): Payer: Self-pay

## 2021-07-24 ENCOUNTER — Emergency Department (HOSPITAL_COMMUNITY): Payer: Medicare Other

## 2021-07-24 DIAGNOSIS — J849 Interstitial pulmonary disease, unspecified: Secondary | ICD-10-CM

## 2021-07-24 DIAGNOSIS — Z20822 Contact with and (suspected) exposure to covid-19: Secondary | ICD-10-CM | POA: Diagnosis present

## 2021-07-24 DIAGNOSIS — Z8739 Personal history of other diseases of the musculoskeletal system and connective tissue: Secondary | ICD-10-CM

## 2021-07-24 DIAGNOSIS — J8410 Pulmonary fibrosis, unspecified: Secondary | ICD-10-CM | POA: Diagnosis present

## 2021-07-24 DIAGNOSIS — I10 Essential (primary) hypertension: Secondary | ICD-10-CM | POA: Diagnosis present

## 2021-07-24 DIAGNOSIS — D6489 Other specified anemias: Secondary | ICD-10-CM | POA: Diagnosis not present

## 2021-07-24 DIAGNOSIS — R410 Disorientation, unspecified: Secondary | ICD-10-CM | POA: Diagnosis not present

## 2021-07-24 DIAGNOSIS — R7989 Other specified abnormal findings of blood chemistry: Secondary | ICD-10-CM

## 2021-07-24 DIAGNOSIS — E222 Syndrome of inappropriate secretion of antidiuretic hormone: Secondary | ICD-10-CM | POA: Diagnosis present

## 2021-07-24 DIAGNOSIS — Y95 Nosocomial condition: Secondary | ICD-10-CM | POA: Diagnosis present

## 2021-07-24 DIAGNOSIS — J189 Pneumonia, unspecified organism: Principal | ICD-10-CM

## 2021-07-24 DIAGNOSIS — E7800 Pure hypercholesterolemia, unspecified: Secondary | ICD-10-CM | POA: Diagnosis present

## 2021-07-24 DIAGNOSIS — W19XXXD Unspecified fall, subsequent encounter: Secondary | ICD-10-CM | POA: Diagnosis present

## 2021-07-24 DIAGNOSIS — R54 Age-related physical debility: Secondary | ICD-10-CM | POA: Diagnosis present

## 2021-07-24 DIAGNOSIS — G4733 Obstructive sleep apnea (adult) (pediatric): Secondary | ICD-10-CM | POA: Diagnosis present

## 2021-07-24 DIAGNOSIS — D638 Anemia in other chronic diseases classified elsewhere: Secondary | ICD-10-CM | POA: Diagnosis present

## 2021-07-24 DIAGNOSIS — Z803 Family history of malignant neoplasm of breast: Secondary | ICD-10-CM

## 2021-07-24 DIAGNOSIS — M47812 Spondylosis without myelopathy or radiculopathy, cervical region: Secondary | ICD-10-CM | POA: Diagnosis present

## 2021-07-24 DIAGNOSIS — Z888 Allergy status to other drugs, medicaments and biological substances status: Secondary | ICD-10-CM

## 2021-07-24 DIAGNOSIS — M810 Age-related osteoporosis without current pathological fracture: Secondary | ICD-10-CM | POA: Diagnosis present

## 2021-07-24 DIAGNOSIS — J9601 Acute respiratory failure with hypoxia: Secondary | ICD-10-CM

## 2021-07-24 DIAGNOSIS — I4892 Unspecified atrial flutter: Secondary | ICD-10-CM | POA: Diagnosis present

## 2021-07-24 DIAGNOSIS — Z96651 Presence of right artificial knee joint: Secondary | ICD-10-CM | POA: Diagnosis present

## 2021-07-24 DIAGNOSIS — Z91040 Latex allergy status: Secondary | ICD-10-CM | POA: Diagnosis not present

## 2021-07-24 DIAGNOSIS — Z66 Do not resuscitate: Secondary | ICD-10-CM | POA: Diagnosis not present

## 2021-07-24 DIAGNOSIS — K219 Gastro-esophageal reflux disease without esophagitis: Secondary | ICD-10-CM | POA: Diagnosis present

## 2021-07-24 DIAGNOSIS — E890 Postprocedural hypothyroidism: Secondary | ICD-10-CM | POA: Diagnosis present

## 2021-07-24 DIAGNOSIS — Z923 Personal history of irradiation: Secondary | ICD-10-CM

## 2021-07-24 DIAGNOSIS — E860 Dehydration: Secondary | ICD-10-CM | POA: Diagnosis present

## 2021-07-24 DIAGNOSIS — Z85828 Personal history of other malignant neoplasm of skin: Secondary | ICD-10-CM | POA: Diagnosis not present

## 2021-07-24 DIAGNOSIS — I4891 Unspecified atrial fibrillation: Secondary | ICD-10-CM | POA: Diagnosis present

## 2021-07-24 DIAGNOSIS — Z515 Encounter for palliative care: Secondary | ICD-10-CM

## 2021-07-24 DIAGNOSIS — G9341 Metabolic encephalopathy: Secondary | ICD-10-CM | POA: Diagnosis present

## 2021-07-24 DIAGNOSIS — Z79890 Hormone replacement therapy (postmenopausal): Secondary | ICD-10-CM | POA: Diagnosis not present

## 2021-07-24 DIAGNOSIS — R7401 Elevation of levels of liver transaminase levels: Secondary | ICD-10-CM | POA: Diagnosis present

## 2021-07-24 DIAGNOSIS — Z885 Allergy status to narcotic agent status: Secondary | ICD-10-CM

## 2021-07-24 DIAGNOSIS — M353 Polymyalgia rheumatica: Secondary | ICD-10-CM | POA: Diagnosis present

## 2021-07-24 DIAGNOSIS — Z9981 Dependence on supplemental oxygen: Secondary | ICD-10-CM

## 2021-07-24 DIAGNOSIS — Z79899 Other long term (current) drug therapy: Secondary | ICD-10-CM

## 2021-07-24 DIAGNOSIS — R0602 Shortness of breath: Secondary | ICD-10-CM | POA: Diagnosis present

## 2021-07-24 DIAGNOSIS — S12500A Unspecified displaced fracture of sixth cervical vertebra, initial encounter for closed fracture: Secondary | ICD-10-CM | POA: Diagnosis present

## 2021-07-24 DIAGNOSIS — S12501D Unspecified nondisplaced fracture of sixth cervical vertebra, subsequent encounter for fracture with routine healing: Secondary | ICD-10-CM | POA: Diagnosis not present

## 2021-07-24 LAB — CBC
HCT: 29.3 % — ABNORMAL LOW (ref 36.0–46.0)
HCT: 30.4 % — ABNORMAL LOW (ref 36.0–46.0)
Hemoglobin: 10.5 g/dL — ABNORMAL LOW (ref 12.0–15.0)
Hemoglobin: 10.6 g/dL — ABNORMAL LOW (ref 12.0–15.0)
MCH: 32.8 pg (ref 26.0–34.0)
MCH: 34 pg (ref 26.0–34.0)
MCHC: 34.5 g/dL (ref 30.0–36.0)
MCHC: 36.2 g/dL — ABNORMAL HIGH (ref 30.0–36.0)
MCV: 93.9 fL (ref 80.0–100.0)
MCV: 95 fL (ref 80.0–100.0)
Platelets: 417 10*3/uL — ABNORMAL HIGH (ref 150–400)
Platelets: 444 10*3/uL — ABNORMAL HIGH (ref 150–400)
RBC: 3.12 MIL/uL — ABNORMAL LOW (ref 3.87–5.11)
RBC: 3.2 MIL/uL — ABNORMAL LOW (ref 3.87–5.11)
RDW: 16.1 % — ABNORMAL HIGH (ref 11.5–15.5)
RDW: 16.2 % — ABNORMAL HIGH (ref 11.5–15.5)
WBC: 16.6 10*3/uL — ABNORMAL HIGH (ref 4.0–10.5)
WBC: 16.7 10*3/uL — ABNORMAL HIGH (ref 4.0–10.5)
nRBC: 0 % (ref 0.0–0.2)
nRBC: 0 % (ref 0.0–0.2)

## 2021-07-24 LAB — BASIC METABOLIC PANEL
Anion gap: 8 (ref 5–15)
Anion gap: 10 (ref 5–15)
BUN: 21 mg/dL (ref 8–23)
BUN: 18 mg/dL (ref 8–23)
CO2: 26 mmol/L (ref 22–32)
CO2: 22 mmol/L (ref 22–32)
Calcium: 8.5 mg/dL — ABNORMAL LOW (ref 8.9–10.3)
Calcium: 8.4 mg/dL — ABNORMAL LOW (ref 8.9–10.3)
Chloride: 92 mmol/L — ABNORMAL LOW (ref 98–111)
Chloride: 92 mmol/L — ABNORMAL LOW (ref 98–111)
Creatinine, Ser: 0.53 mg/dL (ref 0.44–1.00)
Creatinine, Ser: 0.58 mg/dL (ref 0.44–1.00)
GFR, Estimated: >60 mL/min (ref >60)
GFR, Estimated: >60 mL/min (ref >60)
Glucose, Bld: 103 mg/dL — ABNORMAL HIGH (ref 70–99)
Glucose, Bld: 105 mg/dL — ABNORMAL HIGH (ref 70–99)
Potassium: 4.8 mmol/L (ref 3.5–5.1)
Potassium: 4.3 mmol/L (ref 3.5–5.1)
Sodium: 126 mmol/L — ABNORMAL LOW (ref 135–145)
Sodium: 124 mmol/L — ABNORMAL LOW (ref 135–145)

## 2021-07-24 LAB — BLOOD GAS, ARTERIAL
Acid-Base Excess: 1.5 mmol/L (ref 0.0–2.0)
Bicarbonate: 24.7 mmol/L (ref 20.0–28.0)
Drawn by: 23281
O2 Content: 10 L/min
O2 Saturation: 97.1 %
Patient temperature: 36.6
pCO2 arterial: 33 mmHg (ref 32–48)
pH, Arterial: 7.48 — ABNORMAL HIGH (ref 7.35–7.45)
pO2, Arterial: 62 mmHg — ABNORMAL LOW (ref 83–108)

## 2021-07-24 LAB — RESP PANEL BY RT-PCR (FLU A&B, COVID) ARPGX2
Influenza A by PCR: NEGATIVE
Influenza B by PCR: NEGATIVE
SARS Coronavirus 2 by RT PCR: NEGATIVE

## 2021-07-24 LAB — MRSA NEXT GEN BY PCR, NASAL: MRSA by PCR Next Gen: NOT DETECTED

## 2021-07-24 LAB — LACTIC ACID, PLASMA: Lactic Acid, Venous: 1 mmol/L (ref 0.5–1.9)

## 2021-07-24 LAB — SODIUM, URINE, RANDOM: Sodium, Ur: 41 mmol/L

## 2021-07-24 LAB — BRAIN NATRIURETIC PEPTIDE: B Natriuretic Peptide: 236.3 pg/mL — ABNORMAL HIGH (ref 0.0–100.0)

## 2021-07-24 MED ORDER — LORAZEPAM 2 MG/ML IJ SOLN
0.2500 mg | Freq: Once | INTRAMUSCULAR | Status: DC
  Filled 2021-07-24: qty 1

## 2021-07-24 MED ORDER — LEVOTHYROXINE SODIUM 100 MCG PO TABS
100.0000 ug | ORAL_TABLET | ORAL | Status: DC
  Administered 2021-07-26: 100 ug via ORAL
  Filled 2021-07-24 (×2): qty 1

## 2021-07-24 MED ORDER — METOPROLOL TARTRATE 25 MG PO TABS
12.5000 mg | ORAL_TABLET | Freq: Two times a day (BID) | ORAL | Status: DC
Start: 2021-07-24 — End: 2021-07-25
  Administered 2021-07-24 (×2): 12.5 mg via ORAL
  Filled 2021-07-24 (×2): qty 1

## 2021-07-24 MED ORDER — SENNOSIDES-DOCUSATE SODIUM 8.6-50 MG PO TABS
1.0000 | ORAL_TABLET | Freq: Every day | ORAL | Status: DC
  Administered 2021-07-24 – 2021-07-26 (×3): 1 via ORAL
  Filled 2021-07-24 (×3): qty 1

## 2021-07-24 MED ORDER — LACTATED RINGERS IV SOLN: INTRAVENOUS | Status: DC

## 2021-07-24 MED ORDER — ONDANSETRON HCL 4 MG PO TABS: 4.0000 mg | ORAL_TABLET | Freq: Four times a day (QID) | ORAL | Status: DC | PRN

## 2021-07-24 MED ORDER — ENOXAPARIN SODIUM 40 MG/0.4ML IJ SOSY
40.0000 mg | PREFILLED_SYRINGE | INTRAMUSCULAR | Status: DC
  Administered 2021-07-24 – 2021-07-27 (×4): 40 mg via SUBCUTANEOUS
  Filled 2021-07-24 (×4): qty 0.4

## 2021-07-24 MED ORDER — ALBUTEROL SULFATE (2.5 MG/3ML) 0.083% IN NEBU: 2.5000 mg | INHALATION_SOLUTION | RESPIRATORY_TRACT | Status: DC | PRN

## 2021-07-24 MED ORDER — DILTIAZEM HCL ER COATED BEADS 180 MG PO CP24: 180.0000 mg | ORAL_CAPSULE | Freq: Every morning | ORAL | Status: DC

## 2021-07-24 MED ORDER — VANCOMYCIN HCL IN DEXTROSE 1-5 GM/200ML-% IV SOLN
1000.0000 mg | Freq: Once | INTRAVENOUS | Status: AC
  Administered 2021-07-24: 1000 mg via INTRAVENOUS
  Filled 2021-07-24: qty 200

## 2021-07-24 MED ORDER — CARVEDILOL 12.5 MG PO TABS: 12.5000 mg | ORAL_TABLET | Freq: Two times a day (BID) | ORAL | Status: DC

## 2021-07-24 MED ORDER — ONDANSETRON HCL 4 MG/2ML IJ SOLN
4.0000 mg | Freq: Four times a day (QID) | INTRAMUSCULAR | Status: DC | PRN
Start: 2021-07-24 — End: 2021-07-28
  Administered 2021-07-25: 4 mg via INTRAVENOUS
  Filled 2021-07-24: qty 2

## 2021-07-24 MED ORDER — SODIUM CHLORIDE 0.9 % IV SOLN
2.0000 g | Freq: Once | INTRAVENOUS | Status: AC
  Administered 2021-07-24: 2 g via INTRAVENOUS
  Filled 2021-07-24: qty 2

## 2021-07-24 MED ORDER — TRAMADOL HCL 50 MG PO TABS
50.0000 mg | ORAL_TABLET | Freq: Four times a day (QID) | ORAL | Status: DC | PRN
  Administered 2021-07-24 – 2021-07-26 (×6): 50 mg via ORAL
  Filled 2021-07-24 (×7): qty 1

## 2021-07-24 MED ORDER — ACETAMINOPHEN 325 MG PO TABS
650.0000 mg | ORAL_TABLET | Freq: Four times a day (QID) | ORAL | Status: DC | PRN
Start: 2021-07-24 — End: 2021-07-24

## 2021-07-24 MED ORDER — SODIUM CHLORIDE 0.9 % IV SOLN
2.0000 g | Freq: Two times a day (BID) | INTRAVENOUS | Status: DC
  Administered 2021-07-24 – 2021-07-27 (×6): 2 g via INTRAVENOUS
  Filled 2021-07-24 (×9): qty 2

## 2021-07-24 NOTE — Assessment & Plan Note (Addendum)
Lorraine Brandt is admitted to progressive care unit.  ?Started on Cefepime, vancomycin for antibiotic coverage.  ?Placed on Heated high flow and if oxygen saturation does not improve will need Bipap. Is a full code.  ?RT following.   ?No hypercapnia on ABG ?Check CBC and BMP in am ?

## 2021-07-24 NOTE — ED Provider Notes (Addendum)
?[facility] DEPT ?Provider Note ? ? ?CSN: [id number] ?Arrival date & time: 07/31/2021  9381 ? ?  ? ?History ? ?Chief Complaint  ?Patient presents with  ? Shortness of Breath  ? ? ?Lorraine Brandt is a 88 y.o. female. ? ?HPI ? ?  ? ?This is an 88 year old female with recent history of admission for hyponatremia, atrial flutter, and recent C-spine fractures at C6 and C7 who presents with shortness of breath.  Per EMS they [facility].  They noted her oxygen saturation was 72%.  She was initially placed on 4 L and then a nonrebreather and came up to 97%.  Does have a history of heart failure.  Also has had a recent cough.  No fevers.  Patient is disoriented and unable to provide any history.  She has no complaints at this time. ? ?Level 5 caveat for disorientation ? ?I reviewed the patient's chart.  It appears that during her last hospitalization she had frequent confusion and disorientation with delirium.  Previously lived independently but was discharged to a SNF.  She had significant hyponatremia related to SIADH.  She also was discharged on 2 L of oxygen.  CT at that time showed chronic interstitial changes. ? ?Home Medications ?Prior to Admission medications   ?Medication Sig Start Date End Date Taking? Authorizing Provider  ?acetaminophen (TYLENOL) 500 MG tablet Take 1,000 mg by mouth daily as needed.    [provider]  ?acetaminophen (TYLENOL) 500 MG tablet Take 1,000 mg by mouth in the morning and at bedtime.    [provider]  ?Ascorbic Acid (VITAMIN C) 500 MG CAPS Take 500 mg by mouth daily.    [provider]  ?bisacodyl (DULCOLAX) 5 MG EC tablet Take 1 tablet (5 mg total) by mouth daily as needed for moderate constipation. 07/18/21   Elgergawy, Silver Huguenin, MD  ?Calcium Carb-Cholecalciferol (CALCIUM 500/D PO) Take 200 Units by mouth in the morning and at bedtime.    [provider]  ?Carboxymethylcellul-Glycerin  (LUBRICATING EYE DROPS OP) Place 1 drop into both eyes daily as needed (dry eyes).    [provider]  ?carvedilol (COREG) 12.5 MG tablet Take 1 tablet (12.5 mg total) by mouth 2 (two) times daily with a meal. 07/18/21   Elgergawy, Silver Huguenin, MD  ?cholecalciferol (VITAMIN D3) 25 MCG (1000 UT) tablet Take 1,000 Units by mouth daily.    [provider]  ?diltiazem (CARDIZEM CD) 180 MG 24 hr capsule Take 1 capsule (180 mg total) by mouth every morning. 07/19/21   Elgergawy, Silver Huguenin, MD  ?docusate sodium (COLACE) 100 MG capsule Take 100 mg by mouth daily as needed for mild constipation.    [provider]  ?feeding supplement (ENSURE ENLIVE / ENSURE PLUS) LIQD Take 237 mLs by mouth 2 (two) times daily between meals. 07/18/21   Elgergawy, Silver Huguenin, MD  ?levothyroxine (SYNTHROID) 100 MCG tablet Take 1 tablet (100 mcg total) by mouth daily before breakfast. Please take on empty stomach every Monday, Tuesday, Wednesday, Thursday and Friday. 07/18/21   Elgergawy, Silver Huguenin, MD  ?melatonin 10 MG TABS Take 10 mg by mouth at bedtime as needed. 07/18/21   Elgergawy, Silver Huguenin, MD  ?Omega-3 Fatty Acids (FISH OIL PO) Take 2,000 mg by mouth daily.    [provider]  ?omeprazole (PRILOSEC) 20 MG capsule Take 20 mg by mouth every morning.     [provider]  ?ondansetron (ZOFRAN) 4  MG tablet Take 4 mg by mouth every 4 (four) hours as needed for nausea or vomiting.    [provider]  ?rosuvastatin (CRESTOR) 20 MG tablet Take 1 tablet (20 mg total) by mouth daily. 07/07/19 07/03/22  Skeet Latch, MD  ?   ? ?Allergies    ?Oxycodone, Celebrex [celecoxib], and Latex   ? ?Review of Systems   ?Review of Systems  ?Unable to perform ROS: Mental status change  ? ?Physical Exam ?Updated Vital Signs ?BP 127/84   Pulse 96   Temp 97.9 ?F (36.6 ?C) (Oral)   Resp (!) 25   SpO2 98%  ?Physical Exam ?Vitals and nursing note reviewed.  ?Constitutional:   ?   Appearance: She is well-developed.  ?    Comments: Elderly, frail, no acute distress  ?HENT:  ?   Head: Normocephalic and atraumatic.  ?   Mouth/Throat:  ?   Mouth: Mucous membranes are moist.  ?Eyes:  ?   Pupils: Pupils are equal, round, and reactive to light.  ?Neck:  ?   Comments: C-collar in place ?Cardiovascular:  ?   Rate and Rhythm: Normal rate and regular rhythm.  ?   Heart sounds: Murmur heard.  ?Pulmonary:  ?   Effort: Pulmonary effort is normal. No respiratory distress.  ?   Breath sounds: Rhonchi present. No wheezing.  ?Abdominal:  ?   General: Bowel sounds are normal.  ?   Palpations: Abdomen is soft.  ?Musculoskeletal:  ?   Cervical back: Neck supple.  ?   Right lower leg: No edema.  ?   Left lower leg: No edema.  ?Skin: ?   General: Skin is warm and dry.  ?Neurological:  ?   Mental Status: She is alert.  ?   Comments: Oriented only to herself  ?Psychiatric:     ?   Mood and Affect: Mood normal.  ? ? ?ED Results / Procedures / Treatments   ?Labs ?(all labs ordered are listed, but only abnormal results are displayed) ?Labs Reviewed  ?BASIC METABOLIC PANEL - Abnormal; Notable for the following components:  ?    Result Value  ? Sodium 126 (*)   ? Chloride 92 (*)   ? Glucose, Bld 103 (*)   ? Calcium 8.5 (*)   ? All other components within normal limits  ?CBC - Abnormal; Notable for the following components:  ? WBC 16.7 (*)   ? RBC 3.12 (*)   ? Hemoglobin 10.6 (*)   ? HCT 29.3 (*)   ? MCHC 36.2 (*)   ? RDW 16.2 (*)   ? Platelets 417 (*)   ? All other components within normal limits  ?BRAIN NATRIURETIC PEPTIDE - Abnormal; Notable for the following components:  ? B Natriuretic Peptide 236.3 (*)   ? All other components within normal limits  ?RESP PANEL BY RT-PCR (FLU A&B, COVID) [id number]  ?CULTURE, BLOOD (SINGLE)  ?MRSA NEXT GEN BY PCR, NASAL  ?LACTIC ACID, PLASMA  ? ? ?EKG ?EKG Interpretation ? ?Date/Time:  Wednesday July 24 2021 00:38:38 EDT ?Ventricular Rate:  98 ?PR Interval:  157 ?QRS Duration: 78 ?QT Interval:  312 ?QTC Calculation: 399 ?R  Axis:   -20 ?Text Interpretation: Sinus rhythm Abnormal R-wave progression, early transition Left ventricular hypertrophy Confirmed by Thayer Jew [phone]) on 07/28/2021 2:19:33 AM ? ?Radiology ?DG Chest Portable 1 View ? ?Result Date: 07/17/2021 ?CLINICAL DATA:  Shortness of breath EXAM: PORTABLE CHEST 1 VIEW COMPARISON:  07/13/2021 FINDINGS: Extensive bilateral airspace opacities, most  pronounced in the upper lobes bilaterally concerning for pneumonia. Heart is normal size. No effusions. No acute bony abnormality. IMPRESSION: Bilateral extensive airspace disease concerning for pneumonia. Electronically Signed   By: Rolm Baptise M.D.   On: 07/16/2021 00:53   ? ?Procedures ?Marland KitchenCritical Care ?Performed by: Merryl Hacker, MD ?Authorized by: Merryl Hacker, MD  ? ?Critical care provider statement:  ?  Critical care time (minutes):  60 ?  Critical care was necessary to treat or prevent imminent or life-threatening deterioration of the following conditions:  Respiratory failure ?  Critical care was time spent personally by me on the following activities:  Development of treatment plan with patient or surrogate, discussions with consultants, evaluation of patient's response to treatment, examination of patient, ordering and review of laboratory studies, ordering and review of radiographic studies, ordering and performing treatments and interventions, pulse oximetry, re-evaluation of patient's condition and review of old charts  ? ? ?Medications Ordered in ED ?Medications  ?vancomycin (VANCOCIN) IVPB 1000 mg/200 mL premix (0 mg Intravenous Stopped 07/13/2021 0318)  ?ceFEPIme (MAXIPIME) 2 g in sodium chloride 0.9 % 100 mL IVPB (0 g Intravenous Stopped 08/02/2021 0303)  ? ? ?ED Course/ Medical Decision Making/ A&P ?Clinical Course as of 07/21/2021 0324  ?Wed Jul 24, 2021  ?[place] to patient's daughter, [place].  She indicates that she is full code at this time although her mother has had significant decline over the last  month.  She reports that she had abnormal LFTs at her living facility and was to have an ultrasound today.  She is concerned she may have cancer.  I have ordered this for her given that she will be inpatient. [CH]  ?

## 2021-07-24 NOTE — Assessment & Plan Note (Signed)
GB u/s obtained in ER as pt had outpt U/S scheduled for later today with persistent elevated LFTs ?

## 2021-07-24 NOTE — H&P (Signed)
?History and Physical  ? ? ?Patient: Ruey Storer OEU:[id number] DOB: 04/12/1934 ?DOA: 07/14/2021 ?DOS: the patient was seen and examined on 08/04/2021 ?PCP: Virgie Dad, MD  ?Patient coming from: Home ? ?Chief Complaint:  ?Chief Complaint  ?Patient presents with  ? Shortness of Breath  ? ?HPI: Jersee Winiarski is a 88 y.o. female with medical history significant of recent cervical fracture-has C-collar in place, chronic hyponatremia, atrial flutter, HTN, hypothyroidism, delirium for past few weeks who presents EMS for shortness of breath.  She was noted to have an oxygen saturation of 72% and was placed on 4 L by nonrebreather initially.  Oxygen saturation improved but then again fell after she was in the emergency room.  Is on a nonrebreather 10 L at this time.  She has had delirium for the last few weeks and she had a fall and fractured her neck at C6 and C7.  C-collar is in place.  Initially her daughter was in the emergency room and history was given to the ER provided by the daughter.  Daughter is no longer at bedside.  Patient unable to give any history.  Prior to her following recent admission with a cervical fracture she was living independently but was discharged to a SNF.  She has history of SIADH that causes a hyponatremia.  She has interstitial lung disease and was discharged on 200 oxygen by nasal cannula.  Tmax 99.6 degrees in the emergency room.  She is found to have pneumonia and started on cefepime and vancomycin in the emergency room.  Hospital service asked to admit for further management ? ?Review of Systems: As mentioned in the history of present illness. All other systems reviewed and are negative. ?Past Medical History:  ?Diagnosis Date  ? Arthritis   ? oa  ? Cancer [facility])   ? basal cell skin cancer, multiple areas removed, thryoid  ? Central retinal artery occlusion 07/07/2019  ? DJD (degenerative joint disease)   ? Essential hypertension 07/07/2019  ? GERD (gastroesophageal reflux  disease)   ? takes prilosec   ? Hypertension   ? Hypothyroidism since 2001  ? Osteoporosis   ? Polymyalgia rheumatica ([place])   ? Pure hypercholesterolemia 07/07/2019  ? Sleep apnea   ? uses mouthpiece somnodent, pt wishes to leave mouthpiece at home, pt told moderate severe sleep apnea  ? Thyroid disease   ? Vaginal delivery 1958, [id number]  ? ?Past Surgical History:  ?Procedure Laterality Date  ? ANTERIOR AND POSTERIOR REPAIR N/A 11/02/2014  ? Procedure: ANTERIOR (CYSTOCELE) Repair AND Possible POSTERIOR REPAIR (RECTOCELE);  Surgeon: Princess Bruins, MD;  Location: [facility];  Service: Gynecology;  Laterality: N/A;  ? BUBBLE STUDY  07/28/2019  ? Procedure: BUBBLE STUDY;  Surgeon: [name], MD;  Location: [facility];  Service: Cardiovascular;;  ? CYSTOSCOPY N/A 11/02/2014  ? Procedure: CYSTOSCOPY;  Surgeon: Princess Bruins, MD;  Location: [facility];  Service: Gynecology;  Laterality: N/A;  ? DILATION AND CURETTAGE OF UTERUS    ? EYE SURGERY  2007  ? both eyes lens replacements for cataracts  ? LAMINECTOMY    ? ROBOTIC ASSISTED LAPAROSCOPIC SACROCOLPOPEXY N/A 11/02/2014  ? Procedure: ROBOTIC ASSISTED LAPAROSCOPIC Uterosacral ligament suspension for SACROCOLPOPEXY;  Surgeon: Princess Bruins, MD;  Location: [facility];  Service: Gynecology;  Laterality: N/A;  ? ROBOTIC ASSISTED TOTAL HYSTERECTOMY WITH BILATERAL SALPINGO OOPHERECTOMY Bilateral 11/02/2014  ? Procedure: ROBOTIC ASSISTED TOTAL HYSTERECTOMY WITH BILATERAL SALPINGO OOPHORECTOMY;  Surgeon: Princess Bruins, MD;  Location: [facility];  Service:  Gynecology;  Laterality: Bilateral;  ? THYROIDECTOMY  2001  ? and radiation done also, partial  ? TONSILLECTOMY    ? TOTAL KNEE ARTHROPLASTY Right 04/25/2014  ? Procedure: RIGHT TOTAL KNEE ARTHROPLASTY;  Surgeon: Gearlean Alf, MD;  Location: [facility];  Service: Orthopedics;  Laterality: Right;  ? TUBAL LIGATION    ? ?Social History:  reports that she has never smoked. She has never used smokeless tobacco. She reports  current alcohol use of about 1.0 standard drink per week. She reports that she does not use drugs. ? ?Allergies  ?Allergen Reactions  ? Oxycodone Other (See Comments)  ?  Makes me crazy  ? Celebrex [Celecoxib] Itching and Rash  ?  Had itching, burning and rash to it. Likely adverse effect rather than allergic.  ? Latex Rash  ? ? ?Family History  ?Problem Relation Age of Onset  ? Breast cancer Daughter   ? ? ?Prior to Admission medications   ?Medication Sig Start Date End Date Taking? Authorizing Provider  ?acetaminophen (TYLENOL) 500 MG tablet Take 1,000 mg by mouth daily as needed.    [provider]  ?acetaminophen (TYLENOL) 500 MG tablet Take 1,000 mg by mouth in the morning and at bedtime.    [provider]  ?Ascorbic Acid (VITAMIN C) 500 MG CAPS Take 500 mg by mouth daily.    [provider]  ?bisacodyl (DULCOLAX) 5 MG EC tablet Take 1 tablet (5 mg total) by mouth daily as needed for moderate constipation. 07/18/21   Elgergawy, Silver Huguenin, MD  ?Calcium Carb-Cholecalciferol (CALCIUM 500/D PO) Take 200 Units by mouth in the morning and at bedtime.    [provider]  ?Carboxymethylcellul-Glycerin (LUBRICATING EYE DROPS OP) Place 1 drop into both eyes daily as needed (dry eyes).    [provider]  ?carvedilol (COREG) 12.5 MG tablet Take 1 tablet (12.5 mg total) by mouth 2 (two) times daily with a meal. 07/18/21   Elgergawy, Silver Huguenin, MD  ?cholecalciferol (VITAMIN D3) 25 MCG (1000 UT) tablet Take 1,000 Units by mouth daily.    [provider]  ?diltiazem (CARDIZEM CD) 180 MG 24 hr capsule Take 1 capsule (180 mg total) by mouth every morning. 07/19/21   Elgergawy, Silver Huguenin, MD  ?docusate sodium (COLACE) 100 MG capsule Take 100 mg by mouth daily as needed for mild constipation.    [provider]  ?feeding supplement (ENSURE ENLIVE / ENSURE PLUS) LIQD Take 237 mLs by mouth 2 (two) times daily between meals. 07/18/21   Elgergawy, Silver Huguenin, MD  ?levothyroxine  (SYNTHROID) 100 MCG tablet Take 1 tablet (100 mcg total) by mouth daily before breakfast. Please take on empty stomach every Monday, Tuesday, Wednesday, Thursday and Friday. 07/18/21   Elgergawy, Silver Huguenin, MD  ?melatonin 10 MG TABS Take 10 mg by mouth at bedtime as needed. 07/18/21   Elgergawy, Silver Huguenin, MD  ?Omega-3 Fatty Acids (FISH OIL PO) Take 2,000 mg by mouth daily.    [provider]  ?omeprazole (PRILOSEC) 20 MG capsule Take 20 mg by mouth every morning.     [provider]  ?ondansetron (ZOFRAN) 4 MG tablet Take 4 mg by mouth every 4 (four) hours as needed for nausea or vomiting.    [provider]  ?rosuvastatin (CRESTOR) 20 MG tablet Take 1 tablet (20 mg total) by mouth daily. 07/07/19 07/03/22  Skeet Latch, MD  ? ? ?Physical Exam: ?Vitals:  ? 08/01/2021 0330 08/07/2021 0400 07/13/2021 0425 08/04/2021 0427  ?BP: Marland Kitchen)  143/78 (!) 134/91  103/80  ?Pulse: 99 93 97 92  ?Resp: (!) 28 (!) 25 (!) 28 20  ?Temp:    99.6 ?F (37.6 ?C)  ?TempSrc:    Oral  ?SpO2: 93% 97% 93% 94%  ? ?General: WDWN, Alert and oriented x3.  ?Eyes: EOMI, PERRL, conjunctivae normal. Sclera nonicteric ?HENT:  [place]/AT, external ears normal. Nares patent without epistasis. Mucous membranes are moist. ?Neck: Soft, normal range of motion, supple, no masses, Trachea midline ?Respiratory: clear to auscultation bilaterally, no wheezing, no crackles. Normal respiratory effort. ?Cardiovascular: Regular rate and rhythm, no murmurs / rubs / gallops. No extremity edema. 2+ pedal pulses.  ?Abdomen: Soft, no tenderness, nondistended, no rebound or guarding. No masses palpated. Bowel sounds normoactive ?Musculoskeletal: FROM. no cyanosis. Normal muscle tone.  ?Skin: Warm, dry, intact no rashes, lesions, ulcers. No induration ?Neurologic: CN 2-12 grossly intact.  Normal speech.  Sensation intact, Strength 5/5 in all extremities.   ?Psychiatric: Normal mood.  ? ?Data Reviewed: ?Lab Work:   WBC 16,700 hemoglobin 10.6 hematocrit 29.3 platelets  417,000.  BNP 236.3.  Sodium 126 which is baseline potassium 4.8 chloride 92 bicarb 26 glucose 103 creatinine 0.53 BUN 21 calcium 8.5.  COVID-negative.  Influenza A and B negative.  MRSA by PCR negative ? ?C

## 2021-07-24 NOTE — ED Triage Notes (Signed)
Patient BIB [facility] from [place] at [facility]. Was here on 3/3 for broken neck. Short of breath tonight, oxygen saturation was 72% 4L [place], now 97% NRB. Presents with cough, has heart failure.  ?

## 2021-07-24 NOTE — Assessment & Plan Note (Addendum)
Monitor BP. Coreg and cardizem which are her home meds ?

## 2021-07-24 NOTE — Assessment & Plan Note (Signed)
Pt was placed on NRB in ER but continued to have desat to 87% with this therapy. Trial of heated high flow. ABG does not look bad.  ?Mittens in place as she kept pulling of NRB with delirium ?

## 2021-07-24 NOTE — Assessment & Plan Note (Signed)
Neck brace in place. ?

## 2021-07-24 NOTE — ED Notes (Signed)
Patient had bowel movement. Cleaned up. New linens. Patient continuing to take nonrebreather mask off, oxygen desats to low 80s. Patient reminded multiple times to keep mask on. Patient still took it off 3+ times. Mittens placed on patient.  ?

## 2021-07-24 NOTE — Consult Note (Addendum)
? ?NAME:  Lorraine Brandt, MRN:  [id number], DOB:  1933-11-15, LOS: 0 ?ADMISSION DATE:  07/23/2021, CONSULTATION DATE:  3/15 ?REFERRING MD:  Dr. Erlinda Hong, CHIEF COMPLAINT:  SOB  ? ?History of Present Illness:  ?88 y/o F who presented to [facility] ER on 3/15 with reports of shortness of breath and low oxygen saturations from [place].   ? ?The patient was recently seen in the ER on 2/22 for a fall in the rest room without significant injury.  She was seen again on 3/15 after falling while exiting a car.  She suffered a C6-7 fracture at that time and was placed in a cervical collar.  She was subsequently admitted 3/3 with euvolemic hyponatremia thought to be SIADH (? as was on lexapro and lasix at the time).  Responded to lasix, fluid restriction. She was to follow up with NSGY in 2-4 weeks after the fracture for review for removal of cervical collar.  She was at [place] for rehab efforts.    ? ?The patient was readmitted 3/15 with reports of shortness of breath in the setting of new hypoxic respiratory failure.  Na+ improved 1- 29 and on admission 3/14 was 124.  CXR on admission showed new bilateral infiltrates concerning for PNA.  She was admitted per [facility] and treated for PNA with cefepime and vancomycin.  SLP evaluation was completed and patient passed for thin liquids, regular diet.  Coughing was noted during evaluation.  Pt denies sputum production but does cough and seems to swallow sputum per daughter.  ? ?PCCM consulted for evaluation of abnormal CXR, new hypoxic respiratory failure.  ? ?Pertinent  Medical History  ?Polymyalgia Rheumatica - not on steroids ?GERD ?HTN  ?Hypothyroidism  ?DJD ?OSA - uses oral device for OSA, not on CPAP ? ?Significant Hospital Events: ?Including procedures, antibiotic start and stop dates in addition to other pertinent events   ?3/15 Admit with hypoxia, SOB ? ?Interim History / Subjective:  ?As above ? ?Objective   ?Blood pressure 134/77, pulse 86,  temperature 99.2 ?F (37.3 ?C), temperature source Oral, resp. rate 17, SpO2 96 %. ?   ?FiO2 (%):  [90 %-100 %] 90 %  ? ?Intake/Output Summary (Last 24 hours) at 07/18/2021 1329 ?Last data filed at 07/11/2021 0600 ?Gross per 24 hour  ?Intake 393.48 ml  ?Output --  ?Net 393.48 ml  ? ?There were no vitals filed for this visit. ? ?Examination: ?General: frail elderly adult female lying in bed in NAD, daughter at bedside  ?HENT: MM pink/moist, cervical collar in place, anicteric ?Lungs: non-labored at rest on HFNC, crackles bilaterally ?Cardiovascular: s1s2 regular, SEM ?Abdomen: soft, non-tender, bsx4 active  ?Extremities: warm/dry, fingers with prominent ulnar drift, no edema ?Neuro: sleepy, awakens to voice, speech clear, MAE ?  ? ?Resolved Hospital Problem list   ?  ? ?Assessment & Plan:  ? ?Acute Hypoxic Respiratory Failure ?Concern for possible new aspiration vs HAP from recent admission.  She has a hx of PMR, not on steroids per daughter.  CT images raise concern for possible mild underlying ILD - her PMR may have overlap with rheumatoid (note her hand changes). Given her advanced age, deconditioning and recent neck injury requiring cervical collar, she is high risk for aspiration event even if she did pass the swallow evaluation. Witnessed to fall asleep on exam during eating.  ? ?-continue empiric abx for 7 days ?-aspiration precautions ?-recommend primary team to reach out to NSGY to see when it is  safe to come out of C-Collar ?-no role for steroids here and would decrease bone healing  ?-pulmonary hygiene -IS, mobilize ?-follow intermittent CXR  ?-wean O2 for sats >90% ?-PT efforts  ?-no role for autoimmune work up at this time  ? ?Hyponatremia ?Off lexapro, diuretics.  Had a short course of lasix per PCP after hospitalization for hyponatremia. She appears to be hypovolemic hyponatremia on exam.  ?-defer any additional work up to primary  ? ?Elevated LFT's  ?-? In setting of crestor, defer to [facility] ? ?Best  Practice (right click and "Reselect all SmartList Selections" daily)  ?Per Primary ? ?Labs   ?CBC: ?Recent Labs  ?Lab 07/18/21 ?0250 07/23/21 ?0000 07/31/2021 ?0998 07/28/2021 ?0502  ?WBC 11.8* 12.5 16.7* 16.6*  ?NEUTROABS  --  11,025.00  --   --   ?HGB 12.3 11.5* 10.6* 10.5*  ?HCT 35.3* 34* 29.3* 30.4*  ?MCV 94.4  --  93.9 95.0  ?PLT 267 464* 417* 444*  ? ? ?Basic Metabolic Panel: ?Recent Labs  ?Lab 07/18/21 ?0250 07/23/21 ?0000 08/03/2021 ?3382 08/01/2021 ?0502  ?NA 129* 126* 126* 124*  ?K 5.2* 4.9 4.8 4.3  ?CL 92* 93* 92* 92*  ?CO2 '28 21 26 22  '$ ?GLUCOSE 105*  --  103* 105*  ?BUN 50* '20 21 18  '$ ?CREATININE 0.56 0.6 0.53 0.58  ?CALCIUM 9.6 8.7 8.5* 8.4*  ?MG 2.2  --   --   --   ? ?GFR: ?Estimated Creatinine Clearance: 40.7 mL/min (by C-G formula based on SCr of 0.58 mg/dL). ?Recent Labs  ?Lab 07/18/21 ?0250 07/23/21 ?0000 08/06/2021 ?5053 08/02/2021 ?0201 08/01/2021 ?0502  ?WBC 11.8* 12.5 16.7*  --  16.6*  ?LATICACIDVEN  --   --   --  1.0  --   ? ? ?Liver Function Tests: ?Recent Labs  ?Lab 07/23/21 ?0000  ?AST 165*  ?ALT 299*  ?ALKPHOS 111  ?ALBUMIN 3.0*  ? ?No results for input(s): LIPASE, AMYLASE in the last 168 hours. ?No results for input(s): AMMONIA in the last 168 hours. ? ?ABG ?   ?Component Value Date/Time  ? PHART 7.48 (H) 07/28/2021 0340  ? PCO2ART 33 08/05/2021 0340  ? PO2ART 62 (L) 08/01/2021 0340  ? HCO3 24.7 08/05/2021 0340  ? TCO2 29 07/12/2021 1005  ? O2SAT 97.1 08/02/2021 0340  ?  ? ?Coagulation Profile: ?No results for input(s): INR, PROTIME in the last 168 hours. ? ?Cardiac Enzymes: ?No results for input(s): CKTOTAL, CKMB, CKMBINDEX, TROPONINI in the last 168 hours. ? ?HbA1C: ?No results found for: HGBA1C ? ?CBG: ?No results for input(s): GLUCAP in the last 168 hours. ? ?Review of Systems: Positives in [place]  ?Gen: Denies fever, chills, weight change, fatigue, night sweats ?HEENT: Denies blurred vision, double vision, hearing loss, tinnitus, sinus congestion, rhinorrhea, sore throat, neck stiffness,  dysphagia ?PULM: Denies shortness of breath, cough, sputum production, hemoptysis, wheezing ?CV: Denies chest pain, edema, orthopnea, paroxysmal nocturnal dyspnea, palpitations ?GI: Denies abdominal pain, nausea, vomiting, diarrhea, hematochezia, melena, constipation, change in bowel habits ?GU: Denies dysuria, hematuria, polyuria, oliguria, urethral discharge ?Endocrine: Denies hot or cold intolerance, polyuria, polyphagia or appetite change ?Derm: Denies rash, dry skin, scaling or peeling skin change ?Heme: Denies easy bruising, bleeding, bleeding gums ?Neuro: Denies headache, numbness, weakness, slurred speech, loss of memory or consciousness ? ?Past Medical History:  ?She,  has a past medical history of Arthritis, Cancer ([place]), Central retinal artery occlusion (07/07/2019), DJD (degenerative joint disease), Essential hypertension (07/07/2019), GERD (gastroesophageal reflux disease), Hypertension, Hypothyroidism (since 2001), Osteoporosis,  Polymyalgia rheumatica ([place]), Pure hypercholesterolemia (07/07/2019), Sleep apnea, Thyroid disease, and Vaginal delivery (1958, [id number]).  ? ?Surgical History:  ? ?Past Surgical History:  ?Procedure Laterality Date  ? ANTERIOR AND POSTERIOR REPAIR N/A 11/02/2014  ? Procedure: ANTERIOR (CYSTOCELE) Repair AND Possible POSTERIOR REPAIR (RECTOCELE);  Surgeon: Princess Bruins, MD;  Location: [facility];  Service: Gynecology;  Laterality: N/A;  ? BUBBLE STUDY  07/28/2019  ? Procedure: BUBBLE STUDY;  Surgeon: [name], MD;  Location: [place];  Service: Cardiovascular;;  ? CYSTOSCOPY N/A 11/02/2014  ? Procedure: CYSTOSCOPY;  Surgeon: Princess Bruins, MD;  Location: [facility];  Service: Gynecology;  Laterality: N/A;  ? DILATION AND CURETTAGE OF UTERUS    ? EYE SURGERY  2007  ? both eyes lens replacements for cataracts  ? LAMINECTOMY    ? ROBOTIC ASSISTED LAPAROSCOPIC SACROCOLPOPEXY N/A 11/02/2014  ? Procedure: ROBOTIC ASSISTED LAPAROSCOPIC Uterosacral ligament suspension for  SACROCOLPOPEXY;  Surgeon: Princess Bruins, MD;  Location: [facility];  Service: Gynecology;  Laterality: N/A;  ? ROBOTIC ASSISTED TOTAL HYSTERECTOMY WITH BILATERAL SALPINGO OOPHERECTOMY Bilateral 11/02/2014  ? Procedure: ROBOTIC ASSISTE

## 2021-07-24 NOTE — Evaluation (Signed)
Clinical/Bedside Swallow Evaluation ?Patient Details  ?Name: Lorraine Brandt ?MRN: [id number] ?Date of Birth: 23-Oct-1933 ? ?Today's Date: 08/04/2021 ?Time: SLP Start Time (ACUTE ONLY): 1010 SLP Stop Time (ACUTE ONLY): 1050 ?SLP Time Calculation (min) (ACUTE ONLY): 40 min ? ?Past Medical History:  ?Past Medical History:  ?Diagnosis Date  ? Arthritis   ? oa  ? Cancer [facility])   ? basal cell skin cancer, multiple areas removed, thryoid  ? Central retinal artery occlusion 07/07/2019  ? DJD (degenerative joint disease)   ? Essential hypertension 07/07/2019  ? GERD (gastroesophageal reflux disease)   ? takes prilosec   ? Hypertension   ? Hypothyroidism since 2001  ? Osteoporosis   ? Polymyalgia rheumatica ([place])   ? Pure hypercholesterolemia 07/07/2019  ? Sleep apnea   ? uses mouthpiece somnodent, pt wishes to leave mouthpiece at home, pt told moderate severe sleep apnea  ? Thyroid disease   ? Vaginal delivery 1958, [id number]  ? ?Past Surgical History:  ?Past Surgical History:  ?Procedure Laterality Date  ? ANTERIOR AND POSTERIOR REPAIR N/A 11/02/2014  ? Procedure: ANTERIOR (CYSTOCELE) Repair AND Possible POSTERIOR REPAIR (RECTOCELE);  Surgeon: Princess Bruins, MD;  Location: [facility];  Service: Gynecology;  Laterality: N/A;  ? BUBBLE STUDY  07/28/2019  ? Procedure: BUBBLE STUDY;  Surgeon: [name], MD;  Location: [place];  Service: Cardiovascular;;  ? CYSTOSCOPY N/A 11/02/2014  ? Procedure: CYSTOSCOPY;  Surgeon: Princess Bruins, MD;  Location: [facility];  Service: Gynecology;  Laterality: N/A;  ? DILATION AND CURETTAGE OF UTERUS    ? EYE SURGERY  2007  ? both eyes lens replacements for cataracts  ? LAMINECTOMY    ? ROBOTIC ASSISTED LAPAROSCOPIC SACROCOLPOPEXY N/A 11/02/2014  ? Procedure: ROBOTIC ASSISTED LAPAROSCOPIC Uterosacral ligament suspension for SACROCOLPOPEXY;  Surgeon: Princess Bruins, MD;  Location: [facility];  Service: Gynecology;  Laterality: N/A;  ? ROBOTIC ASSISTED TOTAL HYSTERECTOMY WITH BILATERAL  SALPINGO OOPHERECTOMY Bilateral 11/02/2014  ? Procedure: ROBOTIC ASSISTED TOTAL HYSTERECTOMY WITH BILATERAL SALPINGO OOPHORECTOMY;  Surgeon: Princess Bruins, MD;  Location: [facility];  Service: Gynecology;  Laterality: Bilateral;  ? THYROIDECTOMY  2001  ? and radiation done also, partial  ? TONSILLECTOMY    ? TOTAL KNEE ARTHROPLASTY Right 04/25/2014  ? Procedure: RIGHT TOTAL KNEE ARTHROPLASTY;  Surgeon: Gearlean Alf, MD;  Location: [facility];  Service: Orthopedics;  Laterality: Right;  ? TUBAL LIGATION    ? ?HPI:  ?88yo female admitted 07/22/2021 with SOB. PMH: recent C6 fx (C-collar in place), chronic hyponatremia, A-Flutter, HTN, hypothyroidism, delerium x past few weeks, GERD, polymyalgia rheumatica, OSA.  ?  ?Assessment / Plan / Recommendation  ?Clinical Impression ? Pt seen at bedside for skilled ST intervention targeting assessment of swallow function and safety. CN exam unremarkable. Pt has adequate natural dentition, and reports no difficulty swallowing at home. 2 of pt's daughters were present during this evaluation. Pt was modI for oral care, following which she accepted trials of thin liquid, puree, nectar thick liquid, and solid textures. Slight cough noted x2 - once following thin liquids, and once following nectar thick liquids. Pt passed 3oz water challenge without overt s/s aspiration. Pt is at increased risk for aspiration given age and deconditioning. Discussed diet options with pt's daughters. Will begin regular solids with thin liquids. Meds whole with liquid. Safe swallow precautions posted at [facility] and should be followed to minimize aspiration risk. SLP will follow at bedside to assess diet tolerance, continue education, and determine if instrumental study is needed. ?SLP  Visit Diagnosis: Dysphagia, unspecified (R13.10) ?   ?Aspiration Risk ? Mild aspiration risk  ?  ?Diet Recommendation Regular;Thin liquid  ? ?Liquid Administration via: Cup;Straw ?Medication Administration: Whole meds with  liquid ?Supervision: Patient able to self feed;Staff to assist with self feeding;Intermittent supervision to cue for compensatory strategies ?Compensations: Minimize environmental distractions;Slow rate;Small sips/bites ?Postural Changes: Seated upright at 90 degrees;Remain upright for at least 30 minutes after po intake  ?  ?Other  Recommendations Oral Care Recommendations: Oral care BID   ? ?Recommendations for follow up therapy are one component of a multi-disciplinary discharge planning process, led by the attending physician.  Recommendations may be updated based on patient status, additional functional criteria and insurance authorization. ? ?Follow up Recommendations Other (comment) (TBD)  ? ? ?  ?Assistance Recommended at Discharge Frequent or constant Supervision/Assistance  ?Functional Status Assessment Patient has had a recent decline in their functional status and demonstrates the ability to make significant improvements in function in a reasonable and predictable amount of time.  ?Frequency and Duration min 1 x/week  ?1 week ?  ?   ? ?Prognosis Prognosis for Safe Diet Advancement: Good  ? ?  ? ?Swallow Study   ?General Date of Onset: 07/20/2021 ?HPI: 88yo female admitted 07/10/2021 with SOB. PMH: recent C6 fx (C-collar in place), chronic hyponatremia, A-Flutter, HTN, hypothyroidism, delerium x past few weeks, GERD, polymyalgia rheumatica, OSA. ?Previous Swallow Assessment: BSE 07/12/21 = reg/thin ?Diet Prior to this Study: NPO ?Temperature Spikes Noted: No ?Respiratory Status: Other (comment) (HFNC) ?History of Recent Intubation: No ?Behavior/Cognition: Alert;Cooperative;Pleasant mood ?Oral Cavity Assessment: Within Functional Limits ?Oral Care Completed by SLP: Yes ?Oral Cavity - Dentition: Adequate natural dentition ?Vision: Functional for self-feeding ?Self-Feeding Abilities: Able to feed self;Needs assist ?Patient Positioning: Upright in bed ?Baseline Vocal Quality: Normal ?Volitional Cough:  Strong ?Volitional Swallow: Able to elicit  ?  ?Oral/Motor/Sensory Function Overall Oral Motor/Sensory Function: Within functional limits   ?Ice Chips Ice chips: Within functional limits   ?Thin Liquid Thin Liquid: Impaired ?Pharyngeal  Phase Impairments: Cough - Delayed ?Other Comments: cough x1. Multiple presentations including 3oz water challenge, which did not result in cough response.  ?  ?Nectar Thick Nectar Thick Liquid: Impaired ?Pharyngeal Phase Impairments: Cough - Delayed ?Other Comments: cough x1   ?Honey Thick Honey Thick Liquid: Not tested   ?Puree Puree: Within functional limits ?Presentation: Self Fed;Spoon   ?Solid ? ? ?  Solid: Within functional limits ?Presentation: Self Fed  ? ?  ?Tanasha Menees B. Ziyan Hillmer, MSP, CCC-SLP ?Speech Language Pathologist ?Office: [phone] ? ?Shonna Chock ?07/14/2021,11:01 AM ? ? ? ?

## 2021-07-24 NOTE — Progress Notes (Signed)
PHARMACY NOTE:  ANTIMICROBIAL RENAL DOSAGE ADJUSTMENT ? ?Current antimicrobial regimen includes a mismatch between antimicrobial dosage and estimated renal function.  As per policy approved by the Pharmacy & Therapeutics and Medical Executive Committees, the antimicrobial dosage will be adjusted accordingly. ? ?Current antimicrobial dosage:  Cefepime 1gm IV q8h ? ?Indication: PNA ? ?Renal Function: ? ?Estimated Creatinine Clearance: 40.7 mL/min (by C-G formula based on SCr of 0.53 mg/dL). ?'[]'$      On intermittent HD, scheduled: ?'[]'$      On CRRT ?   ?Antimicrobial dosage has been changed to:  Cefepime 2gm IV q12h ? ?Additional comments: ?MRSA PCR negative- d/c Vancomycin as d/w Dr Tonie Griffith ? ?Thank you for allowing pharmacy to be a part of this patient's care. ? ?Netta Cedars, [facility] ?07/23/2021 4:44 AM ? ? ?   ?

## 2021-07-24 NOTE — Assessment & Plan Note (Signed)
Present for past few weeks. Daughter reported to ER physician that Lorraine Brandt was fully functional and independent until her fall and neck fracture a few weeks ago. Is full code but if does not make improvements soon family will reconsider code status and goals of care ?

## 2021-07-24 NOTE — Progress Notes (Signed)
A consult was received from an ED physician for Cefepime & Vancomycin per pharmacy dosing.  The patient's profile has been reviewed for ht/wt/allergies/indication/available labs.   ?A one time order has been placed for Cefepime 2gm & Vancomycin 1gm IV.   ?Further antibiotics/pharmacy consults should be ordered by admitting physician if indicated.       ?                ?Thank you, ?Netta Cedars PharmD ?07/14/2021  1:46 AM ? ?

## 2021-07-24 NOTE — Progress Notes (Signed)
?PROGRESS NOTE ? ? ? ?Lorraine Brandt  ASN:[id number] DOB: [birth year] DOA: 07/18/2021 ?PCP: Virgie Dad, MD  ? ? ? ?Brief Narrative:  ? ?Lorraine Brandt is a 88 y.o. female with medical history significant of recent cervical fracture-has C-collar in place, chronic hyponatremia, atrial flutter, HTN, hypothyroidism, delirium for past few weeks who presents EMS for shortness of breath.  She was noted to have an oxygen saturation of 72% and was placed on 4 L by nonrebreather initially.   ? ?Subjective: ? ?She is currently aaox3, daughter reports she still have some mild confusion, while three weeks ago, she was totally independent and there was no confusion ?She had back pain and neck pain , received ultram earlier, currently denies pain  laying in bed ?She has her neck collar on, daughter reports she missed her neurosurgery appointment, not sure how long she suppose to be on neck collar ? ?She was hospitalized for hyponatremia a few weeks ago, daughter reports she received lasix last time while she is in the hospital ? ?Currently there is no edema on exam ? ?Assessment & Plan: ? Principal Problem: ?  Multifocal pneumonia ?Active Problems: ?  Acute respiratory failure with hypoxia ([place]) ?  Essential hypertension ?  Elevated LFTs ?  Delirium ?  Closed C6 fracture ([place]) ? ? ? ?Assessment and Plan: ? ? ?Acute hypoxic respiratory failure/bilateral lung infiltrates/  Multifocal pneumonia ?Speech eval ,aspiration precaution ?Continue antibiotics ?Pulmonology consult ? ?Elevated LFTs ?Abdominal ultrasound  ?1. Hepatic cyst. ?2. No cholelithiasis or acute cholecystitis. ?Elevated lft could be from infection ?We will check hepatitis panel, CK level, avoid Tylenol for now ?Trend LFT ? ?Hyponatremia ?Currently appear euvolemic to slightly dehydrated ?will check urine osmole/urine sodium/serum osmo/cortisol level ?Likely combination of SIADH and dehydration ?She received hydration over night, repeat bmp in  am ? ?Essential hypertension/history of atrial flutter ?BP low normal, discontinue Coreg, hold Cardizem, changed to metoprolol, uptitrate metoprolol for heart rate control if BP allows ? ? ?Delirium ?Present for past few weeks. Daughter reported to ER physician that Ms. Menees was fully functional and independent until her fall and neck fracture a few weeks ago. Is full code but if does not make improvements soon family will reconsider code status and goals of care ? ?Closed C6 fracture ([place]) ?Neck brace in place. ? will contact neurosurgery regarding duration of neck collar ? ? ?Hypothyroidism ?Continue Synthroid supplement ?  ? ? ?I have Reviewed nursing notes, Vitals, pain scores, I/o's, Lab results and  imaging results since pt's last encounter, details please see discussion above  ?I ordered the following labs:  ?Unresulted Labs (From admission, onward)  ? ?  Start     Ordered  ? 07/25/21 0500  CK  Tomorrow morning,   R       ? 07/16/2021 1124  ? 07/25/21 0500  CBC with Differential/Platelet  Tomorrow morning,   R       ? 08/02/2021 1124  ? 07/25/21 0500  Comprehensive metabolic panel  Tomorrow morning,   R       ? 07/26/2021 1124  ? 07/25/21 0500  Procalcitonin  Daily,   R     ? 07/29/2021 1308  ? 07/25/21 0500  Cortisol  Tomorrow morning,   R       ? 07/14/2021 2022  ? 07/25/21 0500  Osmolality  Tomorrow morning,   R       ? 07/18/2021 2023  ? 07/25/21 0500  Magnesium  Tomorrow morning,  R       ? 08/03/2021 2023  ? 07/25/21 0500  Hepatitis panel, acute  Tomorrow morning,   R       ? 07/15/2021 2030  ? 07/14/2021 1308  Osmolality, urine  Once,   R       ? 08/07/2021 1307  ? ?  ?  ? ?  ? ? ? ?DVT prophylaxis: enoxaparin (LOVENOX) injection 40 mg Start: 08/02/2021 1000 ? ? ?Code Status:   Code Status: Full Code ? ?Family Communication: Daughter is at bedside ?Disposition:  ? ?Status is: Inpatient ? ?Dispo: The patient is from: [facility] ?             Anticipated d/c is to: SNF ?             Anticipated d/c date is:  TBD ? ?Antimicrobials:   ? ?Anti-infectives (From admission, onward)  ? ? Start     Dose/Rate Route Frequency Ordered Stop  ? 07/29/2021 1400  ceFEPIme (MAXIPIME) 2 g in sodium chloride 0.9 % 100 mL IVPB       ? 2 g ?200 mL/hr over 30 Minutes Intravenous Every 12 hours 07/23/2021 0427    ? 08/03/2021 0145  vancomycin (VANCOCIN) IVPB 1000 mg/200 mL premix       ? 1,000 mg ?200 mL/hr over 60 Minutes Intravenous  Once 07/23/2021 0135 07/22/2021 0318  ? 07/29/2021 0145  ceFEPIme (MAXIPIME) 2 g in sodium chloride 0.9 % 100 mL IVPB       ? 2 g ?200 mL/hr over 30 Minutes Intravenous  Once 07/30/2021 0135 07/23/2021 0303  ? ?  ? ? ? ? ? ?Objective: ?Vitals:  ? 07/23/2021 1437 07/15/2021 1444 07/25/2021 1820 08/01/2021 1857  ?BP:  119/74 (!) 132/109   ?Pulse:  98 (!) 107   ?Resp:  16 16   ?Temp:  99.1 ?F (37.3 ?C) 100.1 ?F (37.8 ?C) 99.8 ?F (37.7 ?C)  ?TempSrc:   Oral Oral  ?SpO2: 93% 92% 93%   ? ? ?Intake/Output Summary (Last 24 hours) at 07/26/2021 2030 ?Last data filed at 08/07/2021 1424 ?Gross per 24 hour  ?Intake 1255.37 ml  ?Output 300 ml  ?Net 955.37 ml  ? ?There were no vitals filed for this visit. ? ?Examination: ? ?General exam: Frail , AAOx3 , + neck collar ?Respiratory system: Bilateral crackles, no wheezing, no rhonchi. Respiratory effort normal. ?Cardiovascular system:  RRR.  ?Gastrointestinal system: Abdomen is nondistended, soft and nontender.  Normal bowel sounds heard. ?Central nervous system: Alert and oriented. No focal neurological deficits. ?Extremities:  no edema ?Skin: No rashes, lesions or ulcers ?Psychiatry: Calm ,no agitation.  ? ? ? ?Data Reviewed: I have personally reviewed  labs and visualized  imaging studies since the last encounter and formulate the plan  ? ? ? ? ? ? ?Scheduled Meds: ? enoxaparin (LOVENOX) injection  40 mg Subcutaneous Q24H  ? levothyroxine  100 mcg Oral Once per day on Mon Tue Wed Thu Fri  ? metoprolol tartrate  12.5 mg Oral BID  ? senna-docusate  1 tablet Oral QHS  ? ?Continuous Infusions: ?  ceFEPime (MAXIPIME) IV 2 g (07/18/2021 1313)  ? ? ? LOS: 0 days  ? ? ? ?Florencia Reasons, MD PhD FACP ?[facility] ? ?Available via Epic secure chat 7am-7pm for nonurgent issues ?Please page for urgent issues ?To page the attending provider between 7A-7P or the covering provider during after hours 7P-7A, please log into the web site [email or web address] and  access using universal [place] password for that web site. If you do not have the password, please call the hospital operator. ? ? ? ?07/28/2021, 8:30 PM  ? ? ?

## 2021-07-24 NOTE — Plan of Care (Signed)
  Problem: Activity: Goal: Ability to tolerate increased activity will improve Outcome: Not Progressing   

## 2021-07-25 DIAGNOSIS — R410 Disorientation, unspecified: Secondary | ICD-10-CM

## 2021-07-25 DIAGNOSIS — J9601 Acute respiratory failure with hypoxia: Secondary | ICD-10-CM | POA: Diagnosis not present

## 2021-07-25 DIAGNOSIS — S12501D Unspecified nondisplaced fracture of sixth cervical vertebra, subsequent encounter for fracture with routine healing: Secondary | ICD-10-CM

## 2021-07-25 DIAGNOSIS — J189 Pneumonia, unspecified organism: Secondary | ICD-10-CM | POA: Diagnosis not present

## 2021-07-25 LAB — OSMOLALITY, URINE
Osmolality, Ur: 535 mOsm/kg (ref 300–900)
Osmolality, Ur: 613 mOsm/kg (ref 300–900)

## 2021-07-25 LAB — CBC WITH DIFFERENTIAL/PLATELET
Abs Immature Granulocytes: 1.35 10*3/uL — ABNORMAL HIGH (ref 0.00–0.07)
Basophils Absolute: 0.1 10*3/uL (ref 0.0–0.1)
Basophils Relative: 1 %
Eosinophils Absolute: 0 10*3/uL (ref 0.0–0.5)
Eosinophils Relative: 0 %
HCT: 29.9 % — ABNORMAL LOW (ref 36.0–46.0)
Hemoglobin: 10.2 g/dL — ABNORMAL LOW (ref 12.0–15.0)
Immature Granulocytes: 7 %
Lymphocytes Relative: 4 %
Lymphs Abs: 0.8 10*3/uL (ref 0.7–4.0)
MCH: 33.2 pg (ref 26.0–34.0)
MCHC: 34.1 g/dL (ref 30.0–36.0)
MCV: 97.4 fL (ref 80.0–100.0)
Monocytes Absolute: 0.7 10*3/uL (ref 0.1–1.0)
Monocytes Relative: 4 %
Neutro Abs: 16.3 10*3/uL — ABNORMAL HIGH (ref 1.7–7.7)
Neutrophils Relative %: 84 %
Platelets: 432 10*3/uL — ABNORMAL HIGH (ref 150–400)
RBC: 3.07 MIL/uL — ABNORMAL LOW (ref 3.87–5.11)
RDW: 16.3 % — ABNORMAL HIGH (ref 11.5–15.5)
WBC: 19.3 10*3/uL — ABNORMAL HIGH (ref 4.0–10.5)
nRBC: 0 % (ref 0.0–0.2)

## 2021-07-25 LAB — COMPREHENSIVE METABOLIC PANEL
ALT: 150 U/L — ABNORMAL HIGH (ref 0–44)
AST: 57 U/L — ABNORMAL HIGH (ref 15–41)
Albumin: 2.5 g/dL — ABNORMAL LOW (ref 3.5–5.0)
Alkaline Phosphatase: 113 U/L (ref 38–126)
Anion gap: 8 (ref 5–15)
BUN: 16 mg/dL (ref 8–23)
CO2: 24 mmol/L (ref 22–32)
Calcium: 8.2 mg/dL — ABNORMAL LOW (ref 8.9–10.3)
Chloride: 94 mmol/L — ABNORMAL LOW (ref 98–111)
Creatinine, Ser: 0.53 mg/dL (ref 0.44–1.00)
GFR, Estimated: >60 mL/min (ref >60)
Glucose, Bld: 94 mg/dL (ref 70–99)
Potassium: 4.6 mmol/L (ref 3.5–5.1)
Sodium: 126 mmol/L — ABNORMAL LOW (ref 135–145)
Total Bilirubin: 1 mg/dL (ref 0.3–1.2)
Total Protein: 5.9 g/dL — ABNORMAL LOW (ref 6.5–8.1)

## 2021-07-25 LAB — NA AND K (SODIUM & POTASSIUM), RAND UR
Potassium Urine: 73 mmol/L
Sodium, Ur: 19 mmol/L

## 2021-07-25 LAB — MAGNESIUM: Magnesium: 2.1 mg/dL (ref 1.7–2.4)

## 2021-07-25 LAB — HEPATITIS PANEL, ACUTE
HCV Ab: NONREACTIVE
Hep A IgM: NONREACTIVE
Hep B C IgM: NONREACTIVE
Hepatitis B Surface Ag: NONREACTIVE

## 2021-07-25 LAB — CK: Total CK: 31 U/L — ABNORMAL LOW (ref 38–234)

## 2021-07-25 LAB — AMMONIA: Ammonia: <10 umol/L (ref 9–35)

## 2021-07-25 LAB — OSMOLALITY: Osmolality: 267 mOsm/kg — ABNORMAL LOW (ref 275–295)

## 2021-07-25 LAB — CORTISOL: Cortisol, Plasma: 12.9 ug/dL

## 2021-07-25 LAB — PROCALCITONIN: Procalcitonin: 0.5 ng/mL

## 2021-07-25 MED ORDER — HALOPERIDOL LACTATE 5 MG/ML IJ SOLN
2.0000 mg | Freq: Four times a day (QID) | INTRAMUSCULAR | Status: DC | PRN
  Administered 2021-07-27: 2 mg via INTRAVENOUS
  Filled 2021-07-25: qty 1

## 2021-07-25 MED ORDER — CHLORHEXIDINE GLUCONATE CLOTH 2 % EX PADS
6.0000 | MEDICATED_PAD | Freq: Every day | CUTANEOUS | Status: DC
  Administered 2021-07-25 – 2021-07-26 (×2): 6 via TOPICAL

## 2021-07-25 MED ORDER — METOPROLOL TARTRATE 25 MG PO TABS
25.0000 mg | ORAL_TABLET | Freq: Two times a day (BID) | ORAL | Status: DC
  Administered 2021-07-25 – 2021-07-27 (×5): 25 mg via ORAL
  Filled 2021-07-25 (×5): qty 1

## 2021-07-25 MED ORDER — MORPHINE SULFATE (PF) 2 MG/ML IV SOLN
1.0000 mg | Freq: Once | INTRAVENOUS | Status: DC
  Filled 2021-07-25: qty 1

## 2021-07-25 MED ORDER — SODIUM CHLORIDE 1 G PO TABS
1.0000 g | ORAL_TABLET | Freq: Two times a day (BID) | ORAL | Status: DC
  Administered 2021-07-25: 1 g via ORAL
  Filled 2021-07-25 (×2): qty 1

## 2021-07-25 MED ORDER — SODIUM CHLORIDE 0.9 % IV SOLN: INTRAVENOUS | Status: DC

## 2021-07-25 MED ORDER — SODIUM CHLORIDE 0.9 % IV SOLN
INTRAVENOUS | Status: DC
Start: 2021-07-25 — End: 2021-07-26

## 2021-07-25 MED ORDER — ORAL CARE MOUTH RINSE
15.0000 mL | Freq: Two times a day (BID) | OROMUCOSAL | Status: DC
  Administered 2021-07-25 (×2): 15 mL via OROMUCOSAL

## 2021-07-25 MED ORDER — CHLORHEXIDINE GLUCONATE 0.12 % MT SOLN
15.0000 mL | Freq: Two times a day (BID) | OROMUCOSAL | Status: DC
  Administered 2021-07-25 – 2021-07-26 (×3): 15 mL via OROMUCOSAL
  Filled 2021-07-25 (×3): qty 15

## 2021-07-25 NOTE — Progress Notes (Addendum)
[facility] ?NEPHROLOGY PROGRESS NOTE ? ?Assessment/ Plan: ?Pt is a 88 y.o. yo female with past medical history significant for recent cervical fracture, hypertension, hypothyroidism, delirium, hyponatremia who was readmitted on 3/15 for shortness of breath, seen as a consultation for the management of hyponatremia.  Patient was recently discharged from hospital on 3/9 when she was seen by our team for hyponatremia with lowest sodium level of 113, thought to be due to SIADH.  Patient initially required 3% saline with improvement of sodium level.  She however has chronic hyponatremia with baseline sodium level seems to be around 126.  She is now transferred to ICU for worsening respiratory status. ? ?#Chronic hyponatremia, euvolemic: Due to SIADH in the setting of pneumonia, ILD, ? pain from recent neck surgery and may also have low solute intake.  The urine electrolytes and osmolality consistent with SIADH.  No need for hypertonic saline as sodium level is stable and do not think her delirium is caused by symptomatic hyponatremia. ?I will repeat TSH level, a.m. cortisol level, Urine Na, osmolality. ?The ICU team started NS @  50 cc/hr as patient will be n.p.o. and not eating, mainly for insensible water loss.  Currently Lasix on hold.  Add salt tablet to increase solute when she is able to take orally. ?Monitor lab. ? ?#Acute hypoxic respiratory failure secondary to HCAP: Currently on cefepime, supplemental oxygen.  Pulmonary team is following and patient is being transferred to ICU for worsening respiratory status. ? ?#Acute metabolic encephalopathy/delirium: Monitoring in ICU, reorientation and supportive care. ? ?#History of polymyalgia rheumatica with ILD: ? ?#Anemia of critical illness: Hemoglobin stable, monitor CBC. ? ?Discussed with ICU team. ? ?Subjective: Seen and examined in ICU.  Patient was transferred from floor.  She is on high flow oxygen.  She is alert awake but not oriented and hard  to obtain review of system. ?Objective ?Vital signs in last 24 hours: ?Vitals:  ? 07/25/21 0615 07/25/21 0819 07/25/21 1244 07/25/21 1300  ?BP:    (!) 128/54  ?Pulse:  (!) 108 (!) 114 94  ?Resp:  (!) 25 (!) 24 18  ?Temp:      ?TempSrc:      ?SpO2: 94% (!) 88% (!) 85% 96%  ? ?Weight change:  ? ?Intake/Output Summary (Last 24 hours) at 07/25/2021 1335 ?Last data filed at 07/25/2021 1130 ?Gross per 24 hour  ?Intake 1081.89 ml  ?Output 800 ml  ?Net 281.89 ml  ? ? ? ? ? ?Labs: ?Basic Metabolic Panel: ?Recent Labs  ?Lab 07/25/2021 ?2458 07/10/2021 ?0502 07/25/21 ?0444  ?NA 126* 124* 126*  ?K 4.8 4.3 4.6  ?CL 92* 92* 94*  ?CO2 '26 22 24  '$ ?GLUCOSE 103* 105* 94  ?BUN '21 18 16  '$ ?CREATININE 0.53 0.58 0.53  ?CALCIUM 8.5* 8.4* 8.2*  ? ?Liver Function Tests: ?Recent Labs  ?Lab 07/23/21 ?0000 07/25/21 ?0998  ?AST 165* 57*  ?ALT 299* 150*  ?ALKPHOS 111 113  ?BILITOT  --  1.0  ?PROT  --  5.9*  ?ALBUMIN 3.0* 2.5*  ? ?No results for input(s): LIPASE, AMYLASE in the last 168 hours. ?Recent Labs  ?Lab 07/25/21 ?1103  ?AMMONIA <10  ? ?CBC: ?Recent Labs  ?Lab 07/23/21 ?0000 07/30/2021 ?3382 08/01/2021 ?0502 07/25/21 ?0444  ?WBC 12.5 16.7* 16.6* 19.3*  ?NEUTROABS 11,025.00  --   --  16.3*  ?HGB 11.5* 10.6* 10.5* 10.2*  ?HCT 34* 29.3* 30.4* 29.9*  ?MCV  --  93.9 95.0 97.4  ?PLT 464* 417* 444* 432*  ? ?  Cardiac Enzymes: ?Recent Labs  ?Lab 07/25/21 ?3532  ?CKTOTAL 31*  ? ?CBG: ?No results for input(s): GLUCAP in the last 168 hours. ? ?Iron Studies: No results for input(s): IRON, TIBC, TRANSFERRIN, FERRITIN in the last 72 hours. ?Studies/Results: ?DG Chest Portable 1 View ? ?Result Date: 07/14/2021 ?CLINICAL DATA:  Shortness of breath EXAM: PORTABLE CHEST 1 VIEW COMPARISON:  07/13/2021 FINDINGS: Extensive bilateral airspace opacities, most pronounced in the upper lobes bilaterally concerning for pneumonia. Heart is normal size. No effusions. No acute bony abnormality. IMPRESSION: Bilateral extensive airspace disease concerning for pneumonia. Electronically  Signed   By: Rolm Baptise M.D.   On: 07/20/2021 00:53  ? ?US Abdomen Limited RUQ (LIVER/GB) ? ?Result Date: 07/13/2021 ?CLINICAL DATA:  Elevated LFTs. EXAM: ULTRASOUND ABDOMEN LIMITED RIGHT UPPER QUADRANT COMPARISON:  None. FINDINGS: Gallbladder: No gallstones or wall thickening visualized. No sonographic Murphy sign noted by sonographer. Common bile duct: Diameter: 4 mm. Liver: A small cyst is present in the right lobe of the liver measuring 0.9 x 0.7 x 1.1 cm. Within normal limits in parenchymal echogenicity. Portal vein is patent on color Doppler imaging with normal direction of blood flow towards the liver. Other: No free fluid. IMPRESSION: 1. Hepatic cyst. 2. No cholelithiasis or acute cholecystitis. Electronically Signed   By: Brett Fairy M.D.   On: 07/28/2021 04:14   ? ?Medications: ?Infusions: ? sodium chloride    ? ceFEPime (MAXIPIME) IV 2 g (07/25/21 0124)  ? ? ?Scheduled Medications: ? chlorhexidine  15 mL Mouth Rinse BID  ? Chlorhexidine Gluconate Cloth  6 each Topical Daily  ? enoxaparin (LOVENOX) injection  40 mg Subcutaneous Q24H  ? levothyroxine  100 mcg Oral Once per day on Mon Tue Wed Thu Fri  ? LORazepam  0.25 mg Intravenous Once  ? mouth rinse  15 mL Mouth Rinse q12n4p  ? metoprolol tartrate  25 mg Oral BID  ? senna-docusate  1 tablet Oral QHS  ? ? have reviewed scheduled and prn medications. ? ?Physical Exam: ?General: Ill-looking female, with high flow oxygen, alert awake but not oriented ?Heart:RRR, s1s2 nl ?Lungs: Coarse breath sound bilateral, mild increased work of breathing ?Abdomen:soft, Non-tender, non-distended ?Extremities:No edema ? ? ?Benancio Osmundson Tanna Furry ?07/25/2021,1:35 PM ? LOS: 1 day  ? ?

## 2021-07-25 NOTE — Progress Notes (Signed)
Patient is currently not wearing the NRB mask because it is uncomfortable.  The patient is sleeping with sats 88%.  RN notified. ?

## 2021-07-25 NOTE — TOC Initial Note (Signed)
Transition of Care (TOC) - Initial/Assessment Note  ? ? ?Patient Details  ?Name: Lorraine Brandt ?MRN: [id number] ?Date of Birth: Oct 28, 1933 ? ?Transition of [facility]) CM/SW Contact:    ?Dessa Phi, RN ?Phone Number: ?07/25/2021, 1:18 PM ? ?Clinical Narrative:  Transferred to SDU;Spoke to dtr [name]-d/c plan to return back to [place] aware of return back to [facility]. Await PT cons when appropriate.               ? ? ?Expected Discharge Plan: [place] ?  ? ? ?Patient Goals and CMS Choice ?Patient states their goals for this hospitalization and ongoing recovery are:: Return back to [facility] ?CMS Medicare.gov Compare Post Acute Care list provided to:: Patient Represenative (must comment) (dtr [name]) ?Choice offered to / list presented to : Patient, Adult Children ? ?Expected Discharge Plan and Services ?Expected Discharge Plan: [place] ?  ?Discharge Planning Services: CM Consult ?Post Acute Care Choice: [place] ?Living arrangements for the past 2 months: [place] ?                ?  ?  ?  ?  ?  ?  ?  ?  ?  ?  ? ?Prior Living Arrangements/Services ?Living arrangements for the past 2 months: [place] ?Lives with:: Spouse ?Patient language and need for interpreter reviewed:: Yes ?Do you feel safe going back to the place where you live?: Yes      ?Need for Family Participation in Patient Care: Yes (Comment) ?Care giver support system in place?: Yes (comment) ?  ?Criminal Activity/Legal Involvement Pertinent to Current Situation/Hospitalization: No - Comment as needed ? ?Activities of Daily Living ?Home Assistive Devices/Equipment: C-collar, Eyeglasses, Hearing aid, Walker (specify type) ?ADL Screening (condition at time of admission) ?Patient's cognitive ability adequate to safely complete daily activities?: Yes ?Is the patient deaf or have difficulty hearing?: Yes (hearing  aids) ?Does the patient have difficulty seeing, even when wearing glasses/contacts?: No ?Does the patient have difficulty concentrating, remembering, or making decisions?: No ?Patient able to express need for assistance with ADLs?: Yes ?Does the patient have difficulty dressing or bathing?: Yes ?Independently performs ADLs?: Yes (appropriate for developmental age) ?Does the patient have difficulty walking or climbing stairs?: Yes ?Weakness of Legs: Both ?Weakness of Arms/Hands: None ? ?Permission Sought/Granted ?Permission sought to share information with : Case Manager ?Permission granted to share information with : Yes, Verbal Permission Granted ? Share Information with NAME: Case Manager ?   ?   ?   ? ?Emotional Assessment ?  ?  ?  ?  ?  ?  ? ?Admission diagnosis:  Elevated LFTs [R79.89] ?Acute respiratory failure with hypoxia ([place]) [J96.01] ?Hospital acquired PNA [J18.9, Y95] ?Multifocal pneumonia [J18.9] ?Patient Active Problem List  ? Diagnosis Date Noted  ? Multifocal pneumonia 07/30/2021  ? Elevated LFTs 07/30/2021  ? Nausea 07/22/2021  ? Hypothyroidism 07/22/2021  ? Acute respiratory failure with hypoxia ([place]) 07/18/2021  ? Delirium 07/18/2021  ? Atrial flutter ([place])   ? Hyponatremia 07/12/2021  ? Closed C6 fracture ([place]) 07/12/2021  ? Fall at home, initial encounter 07/12/2021  ? Acute on chronic diastolic CHF (congestive heart failure) ([place]) 07/12/2021  ? Retinal artery occlusion   ? Central retinal artery occlusion 07/07/2019  ? Essential hypertension 07/07/2019  ? Pure hypercholesterolemia 07/07/2019  ? Postoperative state 11/02/2014  ? OA (osteoarthritis) of knee 04/25/2014  ? ?PCP:  Lyndel Safe,  Rene Kocher, MD ?Pharmacy:  No Pharmacies Listed ? ? ? ?Social Determinants of Health (SDOH) Interventions ?  ? ?Readmission Risk Interventions ?No flowsheet data found. ? ? ?

## 2021-07-25 NOTE — Progress Notes (Signed)
?PROGRESS NOTE ? ? ? ?Lorraine Brandt  JJH:[id number] DOB: [birth year] DOA: 08/02/2021 ?PCP: Virgie Dad, MD  ? ? ? ?Brief Narrative:  ? ?Lorraine Brandt is a 88 y.o. female with medical history significant of recent cervical fracture-has C-collar in place, chronic hyponatremia, atrial flutter, HTN, hypothyroidism, delirium for past few weeks who presents EMS for shortness of breath.  She was noted to have an oxygen saturation of 72% and was placed on 4 L by nonrebreather initially.   ? ?Subjective: ? ?She required nonrebreather last night , she is awake this morning but confused  ?Daughter at bedside  ? ?Assessment & Plan: ? Principal Problem: ?  Multifocal pneumonia ?Active Problems: ?  Acute respiratory failure with hypoxia ([place]) ?  Essential hypertension ?  Elevated LFTs ?  Delirium ?  Closed C6 fracture ([place]) ? ? ? ?Assessment and Plan: ? ? ?Acute hypoxic respiratory failure/bilateral lung infiltrates/  Multifocal pneumonia ?Speech eval ,aspiration precaution ?Continue antibiotics ?Case discussed with pccm who plan to move patient to ICU ? ?Elevated LFTs, likely from pneumonia  ?Abdominal ultrasound  ?1. Hepatic cyst. ?2. No cholelithiasis or acute cholecystitis. ?Elevated lft could be from infection ?Negative hepatitis panel, CK not elevated, avoid Tylenol for now ?LFT trend towards normal ? ?Hyponatremia ?Seen by nephrology during recent hospitalization ?Currently appear euvolemic to slightly dehydrated ? urine osmole/urine sodium suggest SIADH ?cortisol level unremarkable, recent tsh unremarkable ?Likely combination of SIADH and dehydration ?She received hydration over night, na 126 this am ?Case discussed with Nephrology who will consulted  ? ?Essential hypertension/history of atrial flutter ?BP low normal, discontinue Coreg, hold Cardizem, changed to metoprolol, uptitrate metoprolol for heart rate control if BP allows ? ? ?Delirium ?Present for past few weeks. Daughter reported to ER physician  that Lorraine Brandt was fully functional and independent until her fall and neck fracture a few weeks ago. Is full code but if does not make improvements soon family will reconsider code status and goals of care ? ?Closed C6 fracture ([place]) from fall in 06/2021 ? Talked to neurosurgery Fenton Malling who recommended keep neck collar on until follow up on 3/29, they plan to repeat x ray at that time , can take collar off for shower, otherwise need to stay on ? ? ?Hypothyroidism ?Continue Synthroid supplement ?  ? ? ?I have Reviewed nursing notes, Vitals, pain scores, I/o's, Lab results and  imaging results since pt's last encounter, details please see discussion above  ?I ordered the following labs:  ?Unresulted Labs (From admission, onward)  ? ?  Start     Ordered  ? 07/26/21 0500  CBC  Tomorrow morning,   R       ? 07/25/21 1039  ? 07/26/21 0500  Comprehensive metabolic panel  Tomorrow morning,   R       ? 07/25/21 1039  ? 07/26/21 0500  Osmolality  Tomorrow morning,   R       ? 07/25/21 1350  ? 07/26/21 0500  TSH  Tomorrow morning,   R       ? 07/25/21 1350  ? 07/26/21 0500  Cortisol, Random  Tomorrow morning,   R       ? 07/25/21 1350  ? 07/25/21 1351  Osmolality, urine  Once,   R       ? 07/25/21 1350  ? 07/25/21 1351  Na and K (sodium & potassium), rand urine  Once,   R       ?  07/25/21 1350  ? 07/25/21 0500  Procalcitonin  Daily,   R     ? 07/23/2021 1308  ? ?  ?  ? ?  ? ? ? ?DVT prophylaxis: enoxaparin (LOVENOX) injection 40 mg Start: 07/11/2021 1000 ? ? ?Code Status:   Code Status: Full Code ? ?Family Communication: Daughter is at bedside ?Disposition:  ? ?Status is: Inpatient ? ?Dispo: The patient is from: [facility] ?             Anticipated d/c is to: SNF ?             Anticipated d/c date is: TBD ? ?Antimicrobials:   ? ?Anti-infectives (From admission, onward)  ? ? Start     Dose/Rate Route Frequency Ordered Stop  ? 07/15/2021 1400  ceFEPIme (MAXIPIME) 2 g in sodium chloride 0.9 % 100 mL IVPB        ? 2 g ?200 mL/hr over 30 Minutes Intravenous Every 12 hours 07/28/2021 0427    ? 08/01/2021 0145  vancomycin (VANCOCIN) IVPB 1000 mg/200 mL premix       ? 1,000 mg ?200 mL/hr over 60 Minutes Intravenous  Once 08/04/2021 0135 08/03/2021 0318  ? 07/13/2021 0145  ceFEPIme (MAXIPIME) 2 g in sodium chloride 0.9 % 100 mL IVPB       ? 2 g ?200 mL/hr over 30 Minutes Intravenous  Once 07/21/2021 0135 07/22/2021 0303  ? ?  ? ? ? ? ? ?Objective: ?Vitals:  ? 07/25/21 0615 07/25/21 0819 07/25/21 1244 07/25/21 1300  ?BP:    (!) 128/54  ?Pulse:  (!) 108 (!) 114 94  ?Resp:  (!) 25 (!) 24 18  ?Temp:      ?TempSrc:      ?SpO2: 94% (!) 88% (!) 85% 96%  ? ? ?Intake/Output Summary (Last 24 hours) at 07/25/2021 1412 ?Last data filed at 07/25/2021 1130 ?Gross per 24 hour  ?Intake 1081.89 ml  ?Output 800 ml  ?Net 281.89 ml  ? ?There were no vitals filed for this visit. ? ?Examination: ? ?General exam: Frail , alert, talking but confused , + neck collar ?Respiratory system: Bilateral crackles, no wheezing, no rhonchi. Respiratory effort normal. ?Cardiovascular system:  RRR.  ?Gastrointestinal system: Abdomen is nondistended, soft and nontender.  Normal bowel sounds heard. ?Central nervous system: Alert , but confused  No focal neurological deficits. ?Extremities:  no edema ?Skin: No rashes, lesions or ulcers ?Psychiatry:confused  ? ? ? ?Data Reviewed: I have personally reviewed  labs and visualized  imaging studies since the last encounter and formulate the plan  ? ? ? ? ? ? ?Scheduled Meds: ? chlorhexidine  15 mL Mouth Rinse BID  ? Chlorhexidine Gluconate Cloth  6 each Topical Daily  ? enoxaparin (LOVENOX) injection  40 mg Subcutaneous Q24H  ? levothyroxine  100 mcg Oral Once per day on Mon Tue Wed Thu Fri  ? LORazepam  0.25 mg Intravenous Once  ? mouth rinse  15 mL Mouth Rinse q12n4p  ? metoprolol tartrate  25 mg Oral BID  ? senna-docusate  1 tablet Oral QHS  ? sodium chloride  1 g Oral BID WC  ? ?Continuous Infusions: ? sodium chloride 50 mL/hr at  07/25/21 1405  ? ceFEPime (MAXIPIME) IV 2 g (07/25/21 0124)  ? ? ? LOS: 1 day  ? ? ? ?Florencia Reasons, MD PhD FACP ?[facility] ? ?Available via Epic secure chat 7am-7pm for nonurgent issues ?Please page for urgent issues ?To page the  attending provider between 7A-7P or the covering provider during after hours 7P-7A, please log into the web site [email or web address] and access using universal [place] password for that web site. If you do not have the password, please call the hospital operator. ? ? ? ?07/25/2021, 2:12 PM  ? ? ?

## 2021-07-25 NOTE — Progress Notes (Signed)
RT assisted with patient transport to ICU without complications. ?

## 2021-07-25 NOTE — Plan of Care (Signed)
?  Problem: Clinical Measurements: Goal: Ability to maintain a body temperature in the normal range will improve Outcome: Progressing   

## 2021-07-25 NOTE — Progress Notes (Addendum)
? ?NAME:  Lorraine Brandt, MRN:  [id number], DOB:  1934/02/22, LOS: 1 ?ADMISSION DATE:  08/02/2021, CONSULTATION DATE:  3/15 ?REFERRING MD:  Dr. Erlinda Hong, CHIEF COMPLAINT:  SOB  ? ?History of Present Illness:  ?88 yo female was in hospital recently after falling and suffering C6 fracture.  She was in rehab and wearing a C collar.  Noted to have hyponatremia and seen by nephrology.  She has prior history of PMR, but family reports she has not been on steroids for years.  She has chronic dry cough for years also.  Family unaware of any prior history of ILD.  She had CT chest on 07/14/21 that showed changes of peripheral predominant subpleural reticulation, traction bronchiectasis, multifocal patchy areas of ground-glass attenuation, and thickening of the peribronchovascular interstitium.  Also calcified granuloma in LUL.  No prior images to compare to.  She was in rehab and then developed hypoxia.  She was transferred back to [facility] and chest xray showed changes of acute pneumonia.   ? ?Pertinent  Medical History  ?Polymyalgia Rheumatica - not on steroids, GERD, HTN, Hypothyroidism, DJD, OSA tx with oral appliance ? ?Significant Hospital Events: ?Including procedures, antibiotic start and stop dates in addition to other pertinent events   ?3/15 Admit with hypoxia, SOB ?3/16 Increased O2 needs and more delirious, transfer to ICU ? ?Interim History / Subjective:  ?As above ? ?Objective   ?Blood pressure 112/78, pulse (!) 108, temperature 98.7 ?F (37.1 ?C), temperature source Oral, resp. rate (!) 25, SpO2 (!) 88 %. ?   ?FiO2 (%):  [90 %-100 %] 100 %  ? ?Intake/Output Summary (Last 24 hours) at 07/25/2021 1028 ?Last data filed at 07/25/2021 0300 ?Gross per 24 hour  ?Intake 1081.89 ml  ?Output 600 ml  ?Net 481.89 ml  ? ?There were no vitals filed for this visit. ? ?Examination: ? ?General - confused ?Eyes - pupils reactive ?ENT - C collar on ?Cardiac - regular, tachycardic ?Chest - b/l crackles ?Abdomen - soft, non tender, + bowel  sounds ?Extremities - decreased muscle bulk ?Skin - no rashes ?Neuro - hallucinating, grabbing in the air, not following commands, mumbles few sounds ? ? ?Resolved Hospital Problem list   ?  ? ?Assessment & Plan:  ? ?Acute hypoxic respiratory failure 2nd to HCAP. ?- Abx day 3 ?- goal SpO2 > 90% ?- monitor need for intubation in ICU ?- f/u CXR ? ?Acute agitated delirium. ?- monitor in ICU ?- prn haldol ?- might need precedex ?- reorient as needed ? ?Hyponatremia likely from SIADH. ?- f/u BMET ? ?C-6 fracture. ?- needs to keep C collar on until 3/29 when she is due for follow up with neurosurgery ? ?History of polymyalgia rheumatica with interstitial lung disease. ?- ILD appears chronic, but was not previously diagnosed ?- will need outpt assessment ? ?Elevated LFTs. ?- abd u/s unremarkable ?- f/u LFT's intermittently ? ?Hx of HTN, A flutter. ?- continue lopressor ? ?Hx of hypothyroidism. ?- continue synthroid ? ?Anemia of chronic disease. ?- f/u CBC ?- transfuse for Hb < 7 ? ?Best Practice (right click and "Reselect all SmartList Selections" daily)  ?DVT: Lovenox ?SUP: N/A ?Lines: N/A ?Foley: N/A ?Nutrition: Regular diet ?Goals of care: Full code, updated pt's daughters at bedside on 3/16 ? ?Labs   ? ?CMP Latest Ref Rng & Units 07/25/2021 07/19/2021 08/04/2021  ?Glucose 70 - 99 mg/dL 94 105(H) 103(H)  ?BUN 8 - 23 mg/dL '16 18 21  '$ ?Creatinine 0.44 - 1.00 mg/dL 0.53 0.58 0.53  ?  Sodium 135 - 145 mmol/L 126(L) 124(L) 126(L)  ?Potassium 3.5 - 5.1 mmol/L 4.6 4.3 4.8  ?Chloride 98 - 111 mmol/L 94(L) 92(L) 92(L)  ?CO2 22 - 32 mmol/L '24 22 26  '$ ?Calcium 8.9 - 10.3 mg/dL 8.2(L) 8.4(L) 8.5(L)  ?Total Protein 6.5 - 8.1 g/dL 5.9(L) - -  ?Total Bilirubin 0.3 - 1.2 mg/dL 1.0 - -  ?Alkaline Phos 38 - 126 U/L 113 - -  ?AST 15 - 41 U/L 57(H) - -  ?ALT 0 - 44 U/L 150(H) - -  ? ? ?CBC Latest Ref Rng & Units 07/25/2021 07/12/2021 07/31/2021  ?WBC 4.0 - 10.5 K/uL 19.3(H) 16.6(H) 16.7(H)  ?Hemoglobin 12.0 - 15.0 g/dL 10.2(L) 10.5(L) 10.6(L)   ?Hematocrit 36.0 - 46.0 % 29.9(L) 30.4(L) 29.3(L)  ?Platelets 150 - 400 K/uL 432(H) 444(H) 417(H)  ? ? ?ABG ?   ?Component Value Date/Time  ? PHART 7.48 (H) 07/25/2021 0340  ? PCO2ART 33 08/03/2021 0340  ? PO2ART 62 (L) 07/16/2021 0340  ? HCO3 24.7 07/23/2021 0340  ? TCO2 29 07/12/2021 1005  ? O2SAT 97.1 07/23/2021 0340  ? ? ?Critical care time: 36 minutes  ?Chesley Mires, MD ?[place] ?Pager - [phone] ?07/25/2021, 10:38 AM ? ? ? ? ? ?

## 2021-07-25 NOTE — Progress Notes (Signed)
Speech Language Pathology Treatment: Dysphagia  ?Patient Details ?Name: Lorraine Brandt ?MRN: [id number] ?DOB: 12-Feb-1934 ?Today's Date: 07/25/2021 ?Time: 1015-1030 ?SLP Time Calculation (min) (ACUTE ONLY): 15 min ? ?Assessment / Plan / Recommendation ?Clinical Impression ? Pt seen at bedside for follow up after BSE completed 08/03/2021. Two of pt's 3 daughters were present this morning. Pt was noted to be more lethargic and more confused. She required multimodal cues to successfully drink Ensure from a straw. Daughters report minimal intake at dinner and breakfast, but did not report overt s/s aspiration. None observed following trials of Ensure. Pt O2 sats were noted to be in the 85-89% range during this session. Pt is transferring to ICU due to need for higher level of care. SLP will follow accordingly.  ?  ?HPI HPI: 88yo female admitted 07/25/2021 with SOB. PMH: recent C6 fx (C-collar in place), chronic hyponatremia, A-Flutter, HTN, hypothyroidism, delerium x past few weeks, GERD, polymyalgia rheumatica, OSA. ?  ?   ?SLP Plan ? Continue with current plan of care ? ?  ?  ?Recommendations for follow up therapy are one component of a multi-disciplinary discharge planning process, led by the attending physician.  Recommendations may be updated based on patient status, additional functional criteria and insurance authorization. ?  ? ?Recommendations  ?Diet recommendations: Regular;Thin liquid ?Liquids provided via: Straw ?Medication Administration: Whole meds with liquid ?Supervision: Full supervision/cueing for compensatory strategies ?Compensations: Minimize environmental distractions;Slow rate;Small sips/bites ?Postural Changes and/or Swallow Maneuvers: Seated upright 90 degrees;Upright 30-60 min after meal  ?   ?    ?   ? ? ? ? Oral Care Recommendations: Oral care BID ?Follow Up Recommendations: Other (comment) (TBD) ?Assistance recommended at discharge: Frequent or constant Supervision/Assistance ?SLP Visit  Diagnosis: Dysphagia, unspecified (R13.10) ?Plan: Continue with current plan of care ? ? ? ? ?  ? Vonnetta Akey B. Matin Mattioli, MSP, CCC-SLP ?Speech Language Pathologist ?Office: [phone] ? ?Shonna Chock ? ?07/25/2021, 10:40 AM ?

## 2021-07-26 ENCOUNTER — Inpatient Hospital Stay (HOSPITAL_COMMUNITY): Payer: Medicare Other

## 2021-07-26 DIAGNOSIS — J189 Pneumonia, unspecified organism: Secondary | ICD-10-CM | POA: Diagnosis not present

## 2021-07-26 LAB — CBC
HCT: 28.4 % — ABNORMAL LOW (ref 36.0–46.0)
Hemoglobin: 9.5 g/dL — ABNORMAL LOW (ref 12.0–15.0)
MCH: 33.1 pg (ref 26.0–34.0)
MCHC: 33.5 g/dL (ref 30.0–36.0)
MCV: 99 fL (ref 80.0–100.0)
Platelets: 451 10*3/uL — ABNORMAL HIGH (ref 150–400)
RBC: 2.87 MIL/uL — ABNORMAL LOW (ref 3.87–5.11)
RDW: 16.5 % — ABNORMAL HIGH (ref 11.5–15.5)
WBC: 20.1 10*3/uL — ABNORMAL HIGH (ref 4.0–10.5)
nRBC: 0 % (ref 0.0–0.2)

## 2021-07-26 LAB — COMPREHENSIVE METABOLIC PANEL
ALT: 150 U/L — ABNORMAL HIGH (ref 0–44)
AST: 114 U/L — ABNORMAL HIGH (ref 15–41)
Albumin: 2.3 g/dL — ABNORMAL LOW (ref 3.5–5.0)
Alkaline Phosphatase: 128 U/L — ABNORMAL HIGH (ref 38–126)
Anion gap: 9 (ref 5–15)
BUN: 26 mg/dL — ABNORMAL HIGH (ref 8–23)
CO2: 21 mmol/L — ABNORMAL LOW (ref 22–32)
Calcium: 8.1 mg/dL — ABNORMAL LOW (ref 8.9–10.3)
Chloride: 101 mmol/L (ref 98–111)
Creatinine, Ser: 0.67 mg/dL (ref 0.44–1.00)
GFR, Estimated: >60 mL/min (ref >60)
Glucose, Bld: 99 mg/dL (ref 70–99)
Potassium: 4.8 mmol/L (ref 3.5–5.1)
Sodium: 131 mmol/L — ABNORMAL LOW (ref 135–145)
Total Bilirubin: 1.3 mg/dL — ABNORMAL HIGH (ref 0.3–1.2)
Total Protein: 6.1 g/dL — ABNORMAL LOW (ref 6.5–8.1)

## 2021-07-26 LAB — MAGNESIUM: Magnesium: 2.3 mg/dL (ref 1.7–2.4)

## 2021-07-26 LAB — TSH: TSH: 1.773 u[IU]/mL (ref 0.350–4.500)

## 2021-07-26 LAB — OSMOLALITY: Osmolality: 276 mOsm/kg (ref 275–295)

## 2021-07-26 LAB — PROCALCITONIN: Procalcitonin: 1.73 ng/mL

## 2021-07-26 MED ORDER — DILTIAZEM HCL-DEXTROSE 125-5 MG/125ML-% IV SOLN (PREMIX)
5.0000 mg/h | INTRAVENOUS | Status: DC
  Administered 2021-07-26: 10 mg/h via INTRAVENOUS
  Administered 2021-07-26: 5 mg/h via INTRAVENOUS
  Filled 2021-07-26 (×3): qty 125

## 2021-07-26 MED ORDER — MORPHINE SULFATE (PF) 2 MG/ML IV SOLN
1.0000 mg | INTRAVENOUS | Status: DC | PRN
  Administered 2021-07-26 – 2021-07-27 (×3): 1 mg via INTRAVENOUS
  Filled 2021-07-26 (×3): qty 1

## 2021-07-26 MED ORDER — METOPROLOL TARTRATE 5 MG/5ML IV SOLN
5.0000 mg | Freq: Once | INTRAVENOUS | Status: AC
  Administered 2021-07-26: 5 mg via INTRAVENOUS
  Filled 2021-07-26: qty 5

## 2021-07-26 MED ORDER — IBUPROFEN 200 MG PO TABS
200.0000 mg | ORAL_TABLET | Freq: Three times a day (TID) | ORAL | Status: DC | PRN
  Administered 2021-07-26: 200 mg via ORAL
  Filled 2021-07-26: qty 1

## 2021-07-26 MED ORDER — SODIUM CHLORIDE 0.9 % IV SOLN: INTRAVENOUS | Status: DC | PRN

## 2021-07-26 MED ORDER — MORPHINE SULFATE (PF) 2 MG/ML IV SOLN
0.5000 mg | INTRAVENOUS | Status: DC | PRN
Start: 2021-07-26 — End: 2021-07-26
  Administered 2021-07-26 (×2): 0.5 mg via INTRAVENOUS
  Filled 2021-07-26 (×2): qty 1

## 2021-07-26 MED ORDER — AYR SALINE NASAL NA GEL
1.0000 "application " | NASAL | Status: DC | PRN
  Filled 2021-07-26: qty 14.1

## 2021-07-26 MED ORDER — DILTIAZEM LOAD VIA INFUSION
10.0000 mg | Freq: Once | INTRAVENOUS | Status: AC
  Administered 2021-07-26: 10 mg via INTRAVENOUS
  Filled 2021-07-26: qty 10

## 2021-07-26 MED ORDER — DILTIAZEM HCL 60 MG PO TABS: 60.0000 mg | ORAL_TABLET | Freq: Three times a day (TID) | ORAL | Status: DC

## 2021-07-26 MED ORDER — SODIUM CHLORIDE 0.9 % IV SOLN: INTRAVENOUS | Status: DC

## 2021-07-26 MED ORDER — ORAL CARE MOUTH RINSE
15.0000 mL | Freq: Two times a day (BID) | OROMUCOSAL | Status: DC
  Administered 2021-07-26 – 2021-07-27 (×3): 15 mL via OROMUCOSAL

## 2021-07-26 NOTE — Plan of Care (Signed)
?  Interdisciplinary Goals of Care Family Meeting ? ? ?Date carried out:: 07/26/2021 ? ?Location of the meeting: Bedside ? ?Member's involved: Nurse Practitioner, Bedside Registered Nurse, and Family Member or next of kin ? ?Durable Power of Tour manager: Shared among daughters, patient has a spouse but he suffered from neuro deficits and is unable to female medical decisions  ? ?Discussion: We discussed goals of care for Lorraine Brandt .  Lorraine Brandt remains critically ill with continued pneumonia with weak cough and poor ability to clear secretions. This was related to [place] (health care power of attorney) given the high concern for need of advanced airway.  ? ?Lorraine Brandt and Lorraine Brandt would like to continue all aggressive measure but wish to change code status to DNR/DNI as these interventions would not provide a quality of life they feel Lorraine Brandt would want.  ? ?If patient displays any signs of discomfort or air hungry plan of care would transition to comfort at that time  ? ?Code status: Full DNR ? ?Disposition: Continue current acute care ? ? ?Time spent for the meeting: 35 mins ? ?Mandeep Ferch D. Harris ?07/26/2021, 11:44 AM ? ?

## 2021-07-26 NOTE — Progress Notes (Signed)
eLink Physician-Brief Progress Note ?Patient Name: Lorraine Brandt ?DOB: 09-03-1933 ?MRN: [id number] ? ? ?Date of Service ? 07/26/2021  ?HPI/Events of Note ? Heart rate remains 130-160 ?Afib rvr  ?eICU Interventions ? Start diltiazem drip  ? ? ? ?Intervention Category ?Major Interventions: Arrhythmia - evaluation and management ? ?Mauri Brooklyn, P ?07/26/2021, 2:12 AM ?

## 2021-07-26 NOTE — Progress Notes (Signed)
? ?NAME:  Lorraine Brandt, MRN:  [id number], DOB:  04-12-1934, LOS: 2 ?ADMISSION DATE:  07/17/2021, CONSULTATION DATE:  3/15 ?REFERRING MD:  Dr. Erlinda Hong, CHIEF COMPLAINT:  SOB  ? ?History of Present Illness:  ?88 yo female was in hospital recently after falling and suffering C6 fracture.  She was in rehab and wearing a C collar.  Noted to have hyponatremia and seen by nephrology.  She has prior history of PMR, but family reports she has not been on steroids for years.  She has chronic dry cough for years also.  Family unaware of any prior history of ILD.  She had CT chest on 07/14/21 that showed changes of peripheral predominant subpleural reticulation, traction bronchiectasis, multifocal patchy areas of ground-glass attenuation, and thickening of the peribronchovascular interstitium.  Also calcified granuloma in LUL.  No prior images to compare to.  She was in rehab and then developed hypoxia.  She was transferred back to [facility] and chest xray showed changes of acute pneumonia.   ? ?Pertinent  Medical History  ?Polymyalgia Rheumatica - not on steroids, GERD, HTN, Hypothyroidism, DJD, OSA tx with oral appliance ? ?Significant Hospital Events: ?Including procedures, antibiotic start and stop dates in addition to other pertinent events   ?3/15 Admit with hypoxia, SOB ?3/16 Increased O2 needs and more delirious, transfer to ICU ?3/17 Remains confused with high supplemental oxygen needs  ? ?Interim History / Subjective:  ?Delirious  ? ?Objective   ?Blood pressure (!) 127/97, pulse 100, temperature (!) 100.4 ?F (38 ?C), temperature source Oral, resp. rate 20, height '5\' 2"'$  (1.575 m), weight 56.2 kg, SpO2 90 %. ?   ?FiO2 (%):  [100 %] 100 %  ? ?Intake/Output Summary (Last 24 hours) at 07/26/2021 0942 ?Last data filed at 07/26/2021 0423 ?Gross per 24 hour  ?Intake 1065.72 ml  ?Output 350 ml  ?Net 715.72 ml  ? ? ?Filed Weights  ? 07/25/21 2000  ?Weight: 56.2 kg  ? ? ?Examination: ?General: Acute on chronically ill appearing elderly  female lying in bed, in NAD ?HEENT: [place]/AT, MM pink/moist, PERRL,  ?Neuro: Alert but delirious ?CV: s1s2 regular rate and rhythm, no murmur, rubs, or gallops,  ?PULM:  Course breath sounds bilaterally, on 40L supplemental oxygen, mild increase work of breathing  ?GI: soft, bowel sounds active in all 4 quadrants, non-tender, non-distended, tolerating TF ?Extremities: warm/dry, no  edema  ?Skin: no rashes or lesions ? ?Resolved Hospital Problem list   ?  ? ?Assessment & Plan:  ? ?Acute hypoxic respiratory failure 2nd to HCAP ?History of polymyalgia rheumatica with interstitial lung disease. ?- ILD appears chronic, but was not previously diagnosed ?- will need outpt assessment ?-CXR 3/17 slightly worse  ?P: ?Continue antibiotics  ?Encourage pulmonary hygiene  ?Continue supplemental oxygen for sat goal > 88 ?Follow intermittent CXR and ABG as needed  ?At right risk intubation will discuss [place] with family ? ?Acute agitated delirium ?P:: ?Maintain neuro protective measures ?Nutrition and bowel regiment  ?Seizure precautions  ?Aspirations precautions  ?Delirium precautions  ? ?Hyponatremia likely from SIADH. ?-Improved  ?P: ?Trend Bmet ?Consider adding salt tabs when able to take orals  ? ?C-6 fracture. ?- needs to keep C collar on until 3/29 when she is due for follow up with neurosurgery ?P: ?Supportive care  ? ?Elevated LFTs. ?- abd u/s unremarkable ?P: ? ?Hx of HTN, A flutter ?P: ?Continuous telemetry  ?Cardizem drip in place  ?Continue beta blocker  ? ?Hx of hypothyroidism ?P: ?Continue synthroid  ? ?  Anemia of chronic disease. ?P: ?Trend CBC  ?Transfuse per protocol  ?Hgb goal > 7 ? ?Best Practice (right click and "Reselect all SmartList Selections" daily)  ?DVT: Lovenox ?SUP: N/A ?Lines: N/A ?Foley: N/A ?Nutrition: Regular diet ?Goals of care: Update family with further discussion on GOC today  ? ?Critical care time: 38 minutes  ?Savalas Monje D. Harris, NP-C ?[facility] ?Personal contact information  can be found on Amion  ?07/26/2021, 9:49 AM ? ? ? ? ? ? ?

## 2021-07-26 NOTE — Progress Notes (Signed)
eLink Physician-Brief Progress Note ?Patient Name: Lorraine Brandt ?DOB: [birth year] ?MRN: [id number] ? ? ?Date of Service ? 07/26/2021  ?HPI/Events of Note ? Afib rvr 130-140. ?Bp 109/72  ?eICU Interventions ? Lopressor 5 mg IV x 1 now  ? ? ? ?Intervention Category ?Major Interventions: Arrhythmia - evaluation and management ? ?Mauri [place], P ?07/26/2021, 1:13 AM ?

## 2021-07-26 NOTE — Progress Notes (Addendum)
?[facility] ?Progress Note  ? ? 88 y.o. yo female with recent cervical fracture, hypertension, hypothyroidism, delirium, hyponatremia readmitted on 3/15 for shortness of breath, seen as a consultation for the management of hyponatremia.  Patient was recently discharged from hospital on 3/9 when she was seen by our team for hyponatremia with lowest sodium level of 113, thought to be due to SIADH.  Patient initially required 3% saline with improvement of sodium level.  She however has chronic hyponatremia with baseline sodium level seems to be around 126.  She is now transferred to ICU for worsening respiratory status. ? ?Assessment/ Plan:   ? ?1) Chronic hyponatremia, euvolemic: Due to SIADH in the setting of pneumonia, ILD, ? pain from recent neck surgery and may also have low solute intake.  The urine electrolytes and osmolality consistent with SIADH.   ? ?Will follow trend but much more concerned about the respiratory status. ? ?Only 1 tablet of NaCl given; would hold for now. Currently Lasix on hold as well and follow trend. Appears to be euvolemic.  ? ?Will follow peripherally as her SNa has trended up and now 131. ?  ?2) Acute hypoxic respiratory failure secondary to HCAP: Currently on cefepime, supplemental oxygen.  Pulmonary team is following and patient is being transferred to ICU for worsening respiratory status. ?  ?3) Acute metabolic encephalopathy/delirium: Monitoring in ICU, reorientation and supportive care. ?  ?4) History of polymyalgia rheumatica with ILD: ?  ?5) Anemia of critical illness: Hemoglobin stable, monitor CBC. ? ?Subjective:   ?C/o shortness of breath despite nonrebreathable FM; denies nausea. ?Daughters bedside.  ? ?Objective:   ?BP (!) 127/97   Pulse (!) 140   Temp 98.9 ?F (37.2 ?C) (Oral)   Resp (!) 22   Ht '5\' 2"'$  (1.575 m)   Wt 56.2 kg   SpO2 (!) 79%   BMI 22.68 kg/m?  ? ?Intake/Output Summary (Last 24 hours) at 07/26/2021 1241 ?Last data filed at 07/26/2021  0423 ?Gross per 24 hour  ?Intake 1065.72 ml  ?Output 150 ml  ?Net 915.72 ml  ? ?Weight change:  ? ?Physical Exam: ?General: Ill-looking female, with high flow oxygen, alert awake NRFM ?Heart:RRR, s1s2 nl ?Lungs: Coarse breath sound bilateral, mild increased work of breathing ?Abdomen:soft, Non-tender, non-distended ?Extremities:No edema ? ?Imaging: ?DG Chest Port 1 View ? ?Result Date: 07/26/2021 ?CLINICAL DATA:  Healthcare associated pneumonia. EXAM: PORTABLE CHEST 1 VIEW COMPARISON:  Radiographs 07/29/2021 and 07/13/2021.  CT 07/14/2021. FINDINGS: 0438 hours. Again demonstrated are diffuse bilateral airspace opacities superimposed on chronic lung disease. These appears similar to the most recent study, although there is slightly worse involvement at the right lung base. No pneumothorax or significant pleural effusion. The heart size and mediastinal contours are stable. No acute osseous findings are evident. Telemetry leads overlie the chest. IMPRESSION: Slight worsening of extensive bilateral airspace opacities remaining most suspicious for multilobar pneumonia. Electronically Signed   By: Richardean Sale M.D.   On: 07/26/2021 08:13   ? ?Labs: ?BMET ?Recent Labs  ?Lab 07/23/21 ?0000 07/10/2021 ?0960 08/02/2021 ?0502 07/25/21 ?4540 07/26/21 ?0256  ?NA 126* 126* 124* 126* 131*  ?K 4.9 4.8 4.3 4.6 4.8  ?CL 93* 92* 92* 94* 101  ?CO2 '21 26 22 24 '$ 21*  ?GLUCOSE  --  103* 105* 94 99  ?BUN '20 21 18 16 '$ 26*  ?CREATININE 0.6 0.53 0.58 0.53 0.67  ?CALCIUM 8.7 8.5* 8.4* 8.2* 8.1*  ? ?CBC ?Recent Labs  ?Lab 07/23/21 ?0000 08/01/2021 ?9811 07/25/2021 ?[place]  07/25/21 ?0444 07/26/21 ?0256  ?WBC 12.5 16.7* 16.6* 19.3* 20.1*  ?NEUTROABS 11,025.00  --   --  16.3*  --   ?HGB 11.5* 10.6* 10.5* 10.2* 9.5*  ?HCT 34* 29.3* 30.4* 29.9* 28.4*  ?MCV  --  93.9 95.0 97.4 99.0  ?PLT 464* 417* 444* 432* 451*  ? ? ?Medications:   ? ? Chlorhexidine Gluconate Cloth  6 each Topical Daily  ? enoxaparin (LOVENOX) injection  40 mg Subcutaneous Q24H  ?  levothyroxine  100 mcg Oral Once per day on Mon Tue Wed Thu Fri  ? LORazepam  0.25 mg Intravenous Once  ? mouth rinse  15 mL Mouth Rinse BID  ? metoprolol tartrate  25 mg Oral BID  ?  morphine injection  1 mg Intravenous Once  ? senna-docusate  1 tablet Oral QHS  ? ? ? ? ?Otelia Santee, MD ?07/26/2021, 12:41 PM  ? ?

## 2021-07-27 DIAGNOSIS — J189 Pneumonia, unspecified organism: Secondary | ICD-10-CM | POA: Diagnosis not present

## 2021-07-27 MED ORDER — ACETAMINOPHEN 325 MG PO TABS: 650.0000 mg | ORAL_TABLET | Freq: Four times a day (QID) | ORAL | Status: DC | PRN

## 2021-07-27 MED ORDER — GLYCOPYRROLATE 1 MG PO TABS: 1.0000 mg | ORAL_TABLET | ORAL | Status: DC | PRN

## 2021-07-27 MED ORDER — MORPHINE 100MG IN NS 100ML (1MG/ML) PREMIX INFUSION
0.0000 mg/h | INTRAVENOUS | Status: DC
  Administered 2021-07-27: 5 mg/h via INTRAVENOUS
  Filled 2021-07-27: qty 100

## 2021-07-27 MED ORDER — GLYCOPYRROLATE 0.2 MG/ML IJ SOLN: 0.2000 mg | INTRAMUSCULAR | Status: DC | PRN

## 2021-07-27 MED ORDER — MORPHINE SULFATE (PF) 2 MG/ML IV SOLN: 2.0000 mg | INTRAVENOUS | Status: DC | PRN

## 2021-07-27 MED ORDER — MIDAZOLAM HCL 2 MG/2ML IJ SOLN
1.0000 mg | INTRAMUSCULAR | Status: DC | PRN
  Filled 2021-07-27: qty 2

## 2021-07-27 MED ORDER — DEXTROSE 5 % IV SOLN: INTRAVENOUS | Status: DC

## 2021-07-27 MED ORDER — MORPHINE SULFATE (PF) 2 MG/ML IV SOLN
2.0000 mg | INTRAVENOUS | Status: DC | PRN
  Administered 2021-07-27: 2 mg via INTRAVENOUS
  Filled 2021-07-27: qty 1

## 2021-07-27 MED ORDER — MORPHINE BOLUS VIA INFUSION
5.0000 mg | INTRAVENOUS | Status: DC | PRN
  Filled 2021-07-27: qty 5

## 2021-07-27 MED ORDER — ACETAMINOPHEN 650 MG RE SUPP: 650.0000 mg | Freq: Four times a day (QID) | RECTAL | Status: DC | PRN

## 2021-07-27 MED ORDER — POLYVINYL ALCOHOL 1.4 % OP SOLN: 1.0000 [drp] | Freq: Four times a day (QID) | OPHTHALMIC | Status: DC | PRN

## 2021-07-27 MED ORDER — MIDAZOLAM BOLUS VIA INFUSION (WITHDRAWAL LIFE SUSTAINING TX)
2.0000 mg | INTRAVENOUS | Status: DC | PRN
  Filled 2021-07-27: qty 2

## 2021-07-27 MED ORDER — DIPHENHYDRAMINE HCL 50 MG/ML IJ SOLN: 25.0000 mg | INTRAMUSCULAR | Status: DC | PRN

## 2021-07-27 MED ORDER — MIDAZOLAM-SODIUM CHLORIDE 100-0.9 MG/100ML-% IV SOLN
0.0000 mg/h | INTRAVENOUS | Status: DC
  Administered 2021-07-27: 1 mg/h via INTRAVENOUS
  Filled 2021-07-27: qty 100

## 2021-07-29 ENCOUNTER — Telehealth: Payer: Self-pay | Admitting: *Deleted

## 2021-07-29 LAB — CULTURE, BLOOD (SINGLE)
Culture: NO GROWTH
Special Requests: ADEQUATE

## 2021-07-29 NOTE — Telephone Encounter (Signed)
Transition Care Management Unsuccessful Follow-up Telephone Call ? ?Date of discharge and from where: Aug 05, 2021 ? ?Attempts:  1st Attempt ? ?Reason for unsuccessful TCM follow-up call:  Unable to reach patient Patient Expired.  ? ?  ?

## 2021-08-10 NOTE — Plan of Care (Signed)
Had discussions with family members about goals of care ? ?Decision was made to make patient comfort measures only ?She appears to be having persistent pain and worsening shortness of breath ?Not maintaining saturations ? ?Questions answered ? ?Will stat medications for comfort measures ?

## 2021-08-10 NOTE — Progress Notes (Signed)
Patient had a desaturation episode earlier in the night.  She removed her NRB and her O2 saturations dropped to the 50s.  Daughter at bedside had replaced the NRB before I entered the room.  She was able to come back up to the 90s, but it did take her a while to recover.  The patient has showing no signs of dyspnea or tachypnea during period when her oxygen saturation was low. She was continuing to speak in complete sentences.  ?

## 2021-08-10 NOTE — Progress Notes (Signed)
Patient has been growing  increasingly agitated and hypoxic this shift, requiring heated high flow titration from 40L HHFNC  with NRMB this AM to now 60L HHFNC/ 100% FiO2 with NRBM. Pt has also been showing increased signs of discomfort by moaning/yelling, and associated restlessness. See mar for PRN interventions. Dr Ander Slade and RT notified. Pt O2 maxed at this time per MD, as pt is DNR. Family wishes to proceed with current scope of treatment at this time. RN will continue to provide PRN interventions as able to decrease WOB and pain.  ?

## 2021-08-10 NOTE — Death Summary Note (Signed)
?DEATH SUMMARY  ? ?Patient Details  ?Name: Lorraine Brandt ?MRN: [id number] ?DOB: May 28, 1933 ? ?Admission/Discharge Information  ? ?Admit Date:  Aug 23, 2021  ?Date of Death: Date of Death: 08-26-21  ?Time of Death: Time of Death: 1945  ?Length of Stay: 3  ?Referring Physician: Virgie Dad, MD  ? ?Reason(s) for Hospitalization  ?Patient admitted with shortness of breath on 2021-08-23 ?Had recently had a fall with a C6-C7 fracture for which she has a c-collar in place ?Activated EMS for worsening shortness of breath ? ?Diagnoses  ?Preliminary cause of death:  ?Multilobar pneumonia ?Pulmonary fibrosis ?Interstitial lung disease ?Acute hypoxemic respiratory failure ?Secondary Diagnoses (including complications and co-morbidities):  ?Principal Problem: ?  Multifocal pneumonia ?Active Problems: ?  Essential hypertension ?  Closed C6 fracture ([place]) ?  Acute respiratory failure with hypoxia ([place]) ?  Delirium ?  Elevated LFTs ? ? ?Brief Hospital Course (including significant findings, care, treatment, and services provided and events leading to death)  ?Lorraine Brandt is a 88 y.o. year old female who presented to the hospital with worsening shortness of breath. ?EMS was activated with patient complaint of worsening shortness of breath ?She had recently had a fall from which she sustained a C6-C7 fracture for which she had a c-collar in place ?Has had delirium in the last few weeks.  She was managed for a C-spine fracture and sent to her skilled nursing facility. ?Initial evaluation did reveal significant hypoxemia with saturations about 72% for which she was on 4 L of oxygen, she however decompensated further and required high flow oxygen.  Was started on antibiotic therapy for healthcare associated pneumonia.  X-ray significant for multifocal infiltrative disease ?CT scan previously revealing evidence of interstitial lung disease ?Underlying history of polymyalgia rheumatica, GERD, hypertension, hypothyroidism,  degenerative joint disease ?Despite antibiotic treatments and oxygen supplementation, patient continued to deteriorate with increasing oxygen requirement.  Becoming more confused and delirious. ?Despite continued antibiotics and aggressive respiratory support, oxygen requirement continued to worsen.  Patient becoming increasingly uncomfortable with complaints of chest pain and discomfort and inability to breathe. ?Discussions had with the family regarding goals of care-they are aware that patient will not desire to be kept alive artificially and decision was made to make a DO NOT RESUSCITATE status.  With worsening status and persistent desaturation despite optimal high flow oxygen supplementation with patient continuing to complain of difficulty breathing, it was felt that she will not want to continue with current treatment without suffering and not being able to breathe.  Decision was made to transition to comfort measures only ? ?Patient succumbed to her illness at 1945 hrs. on 26-Aug-2021 ?Pronounced 2021/08/26. ? ?Because of that his multifocal pneumonia ?Underlying history of interstitial lung disease ? ?Pertinent Labs and Studies  ?Significant Diagnostic Studies ?DG Chest 2 View ? ?Result Date: 07/12/2021 ?CLINICAL DATA:  Pt brought from home for c/o intermittent AMS, increased general weakness and decreased mobility. Pt has non productive cough. Pt denies any CP but states she has been feeling SOB x a few days Nonsmoker Hx of HTN, cancer, GERD, EXAM: CHEST - 2 VIEW COMPARISON:  04/19/2014 FINDINGS: Progressive development of coarse peripheral interstitial opacities, worse left than right, involving bases more than apices. Heart size upper limits normal.  Tortuous ectatic thoracic aorta. No effusion. Levoscoliosis in the upper thoracic spine with suspected vertebral segmentation anomaly, incompletely characterized. IMPRESSION: 1. Coarse asymmetric interstitial opacities left greater than right, suggesting  progressive chronic interstitial lung disease. Cannot exclude superimposed interstitial infiltrate or  asymmetric edema. Electronically Signed   By: Lucrezia Europe M.D.   On: 07/12/2021 10:10  ? ?DG Lumbar Spine Complete ? ?Result Date: 07/03/2021 ?CLINICAL DATA:  Status post fall with lower back pain. EXAM: LUMBAR SPINE - COMPLETE 4+ VIEW COMPARISON:  February 25, 2021 FINDINGS: There is no evidence of acute lumbar spine fracture. Mild dextroscoliosis is seen with stable grade 1 anterolisthesis of the L4 vertebral body on L5. Mild to moderate severity multilevel endplate sclerosis is seen. Stable marked severity intervertebral disc space narrowing is noted at the level of L4-L5. IMPRESSION: 1. No evidence of acute lumbar spine fracture. 2. Stable marked severity degenerative disc disease at L4-L5 with stable grade 1 anterolisthesis of the L4 vertebral body. Electronically Signed   By: Virgina Norfolk M.D.   On: 07/03/2021 02:47  ? ?CT HEAD WO CONTRAST (5MM) ? ?Result Date: 07/18/2021 ?CLINICAL DATA:  Altered mental status.  Fall. EXAM: CT HEAD WITHOUT CONTRAST TECHNIQUE: Contiguous axial images were obtained from the base of the skull through the vertex without intravenous contrast. RADIATION DOSE REDUCTION: This exam was performed according to the departmental dose-optimization program which includes automated exposure control, adjustment of the mA and/or kV according to patient size and/or use of iterative reconstruction technique. COMPARISON:  07/04/2021 FINDINGS: Brain: Expected cerebral and cerebellar atrophy for age. Moderate low density in the periventricular white matter likely related to small vessel disease. No mass lesion, hemorrhage, hydrocephalus, acute infarct, intra-axial, or extra-axial fluid collection. Vascular: No hyperdense vessel or unexpected calcification. Skull: Decrease in left parietooccipital scalp soft tissue swelling. No skull fracture. Sinuses/Orbits: Normal imaged portions of the orbits and  globes. Clear paranasal sinuses and mastoid air cells. Other: None. IMPRESSION: 1.  No acute intracranial abnormality. 2.  Cerebral atrophy and small vessel ischemic change. 3. Improvement in scalp soft tissue swelling. Electronically Signed   By: Abigail Miyamoto M.D.   On: 07/18/2021 13:10  ? ?CT Head Wo Contrast ? ?Result Date: 07/04/2021 ?CLINICAL DATA:  Trauma EXAM: CT HEAD WITHOUT CONTRAST TECHNIQUE: Contiguous axial images were obtained from the base of the skull through the vertex without intravenous contrast. RADIATION DOSE REDUCTION: This exam was performed according to the departmental dose-optimization program which includes automated exposure control, adjustment of the mA and/or kV according to patient size and/or use of iterative reconstruction technique. COMPARISON:  07/03/2021 FINDINGS: Brain: No acute intracranial findings are seen. There are no signs of intracranial bleeding. There are no epidural or subdural fluid collections. Cortical sulci are prominent. There is decreased density in the periventricular white matter. Vascular: Unremarkable. Skull: No fracture is seen in the calvarium. There is subcutaneous contusion/hematoma in the posterior parietal scalp. Sinuses/Orbits: There are no air-fluid levels. Other: None IMPRESSION: No acute intracranial findings are seen in noncontrast CT brain. There is subcutaneous contusion/hematoma in the parietal scalp. No fracture is seen in the calvarium. Electronically Signed   By: Elmer Picker M.D.   On: 07/04/2021 17:47  ? ?CT Head Wo Contrast ? ?Result Date: 07/03/2021 ?CLINICAL DATA:  Recent slip and fall with headaches and neck pain, initial encounter EXAM: CT HEAD WITHOUT CONTRAST CT CERVICAL SPINE WITHOUT CONTRAST TECHNIQUE: Multidetector CT imaging of the head and cervical spine was performed following the standard protocol without intravenous contrast. Multiplanar CT image reconstructions of the cervical spine were also generated. RADIATION DOSE  REDUCTION: This exam was performed according to the departmental dose-optimization program which includes automated exposure control, adjustment of the mA and/or kV according to patient size and/or use  of iterati

## 2021-08-10 NOTE — Progress Notes (Signed)
Morphine gtt started per orders to implement comfort care measures. RN will ensure pt is comfortable and family is ready, before beginning to decrease supplemental O2. RN will continue to offer support to patient and her family.  ?

## 2021-08-10 NOTE — Progress Notes (Addendum)
? ?NAME:  Lorraine Brandt, MRN:  [id number], DOB:  Jul 02, 1933, LOS: 3 ?ADMISSION DATE:  07/23/2021, CONSULTATION DATE:  3/15 ?REFERRING MD:  Dr. Erlinda Hong, CHIEF COMPLAINT:  SOB  ? ?History of Present Illness:  ?88 yo female was in hospital recently after falling and suffering C6 fracture.  She was in rehab and wearing a C collar.  Noted to have hyponatremia and seen by nephrology.  She has prior history of PMR, but family reports she has not been on steroids for years.  She has chronic dry cough for years also.  Family unaware of any prior history of ILD.  She had CT chest on 07/14/21 that showed changes of peripheral predominant subpleural reticulation, traction bronchiectasis, multifocal patchy areas of ground-glass attenuation, and thickening of the peribronchovascular interstitium.  Also calcified granuloma in LUL.  No prior images to compare to.  She was in rehab and then developed hypoxia.  She was transferred back to [facility] and chest xray showed changes of acute pneumonia.   ? ?Pertinent  Medical History  ?Polymyalgia Rheumatica - not on steroids, GERD, HTN, Hypothyroidism, DJD, OSA tx with oral appliance ? ?Significant Hospital Events: ?Including procedures, antibiotic start and stop dates in addition to other pertinent events   ?3/15 Admit with hypoxia, SOB ?3/16 Increased O2 needs and more delirious, transfer to ICU ?3/17 Remains confused with high supplemental oxygen needs  ?3/18 on 100% FiO2 at 60L flow ? ?Interim History / Subjective:  ?Delirious  ?Complaining of some pain ?Objective   ?Blood pressure (!) 156/71, pulse 95, temperature 98.4 ?F (36.9 ?C), temperature source Axillary, resp. rate 20, height '5\' 2"'$  (1.575 m), weight 56.2 kg, SpO2 (!) 83 %. ?   ?FiO2 (%):  [100 %] 100 %  ? ?Intake/Output Summary (Last 24 hours) at 2021/08/10 1218 ?Last data filed at 08/10/21 1217 ?Gross per 24 hour  ?Intake 1710.1 ml  ?Output 200 ml  ?Net 1510.1 ml  ? ?Filed Weights  ? 07/25/21 2000  ?Weight: 56.2 kg   ? ? ?Examination: ?General: Chronically ill-appearing ?HEENT: Moist oral mucosa with a c-collar in place ?Neuro: Alert but delirious ?CV: S1-S2 appreciated ?PULM: Coarse breath sounds ?GI: Bowel sounds appreciated ?Extremities: Warm, no edema ?Skin: no rashes or lesions ? ?Resolved Hospital Problem list   ?  ? ?Assessment & Plan:  ? ?Acute hypoxemic respiratory failure secondary to healthcare associated pneumonia ?History of polymyalgia rheumatica with interstitial lung disease ?-Continue antibiotics ?-Continue pulmonary toileting ?-Continue submental oxygen ?-Goals of care discussion had ?-DO NOT INTUBATE, DO NOT RESUSCITATE ? ?Agitated delirium ?-Aspiration precautions ?Marland Kitchen  Delirium precautions ?- Neuroprotective measures ? ?SIADH ?-Trend electrolytes ? ?C6 fracture ?-Needs to maintain c-collar till 3/29 ?-To follow-up with neurosurgery ? ?History of hypertension ?Atrial flutter ?-On Cardizem, on beta-blocker ?-Continue telemetry ? ?Hx of hypothyroidism ?-Continue Synthroid ? ?Anemia of chronic disease. ?-Transfuse per protocol ? ?Discussed with daughter at bedside, oxygen supplementation is maxed out ?Need to consider comfort measures as patient is complaining of pain and discomfort ? ?Best Practice (right click and "Reselect all SmartList Selections" daily)  ?DVT: Lovenox ?SUP: N/A ?Lines: N/A ?Foley: N/A ?Nutrition: Regular diet ?Goals of care: Update family with further discussion on GOC today  ? ?The patient is critically ill with multiple organ systems failure and requires high complexity decision making for assessment and support, frequent evaluation and titration of therapies, application of advanced monitoring technologies and extensive interpretation of multiple databases. Critical Care Time devoted to patient care services described in this note independent  of APP/resident time (if applicable)  is 32 minutes.  ? ?Sherrilyn Rist MD ?[facility] ?Personal pager: See Shea Evans ?If  unanswered, please page ?CCM On-call: [phone] ? ? ? ? ? ?

## 2021-08-10 DEATH — deceased

## 2021-08-14 ENCOUNTER — Ambulatory Visit (HOSPITAL_BASED_OUTPATIENT_CLINIC_OR_DEPARTMENT_OTHER): Payer: Medicare Other | Admitting: Family

## 2021-09-09 NOTE — Progress Notes (Signed)
?  Progress Note  ? ?Date: 08/28/2021 ? ?Patient Name: Lorraine Brandt        ?MRN#: [id number] ? ?Review the patient?s clinical findings supports the diagnosis of:  ? ?Transaminitis ? ? ? ? ?

## 2021-10-07 IMAGING — CT CT HEAD W/O CM
4 series · 16 of 47 positions shown, 18 images · non-contrast
Comparison: 07/10/2017

CLINICAL DATA: Right-sided retinal artery occlusion. Acute vision
loss right eye.

EXAM:
CT HEAD WITHOUT CONTRAST
TECHNIQUE: Contiguous axial images were obtained from the base of the skull
through the vertex without intravenous contrast.

[Series 3: head without · axial · non-contrast · 0.39mm/px · z∈[-97,+13]mm · 7 of 30 slices shown, 9 images]
[im 4/30  brain]
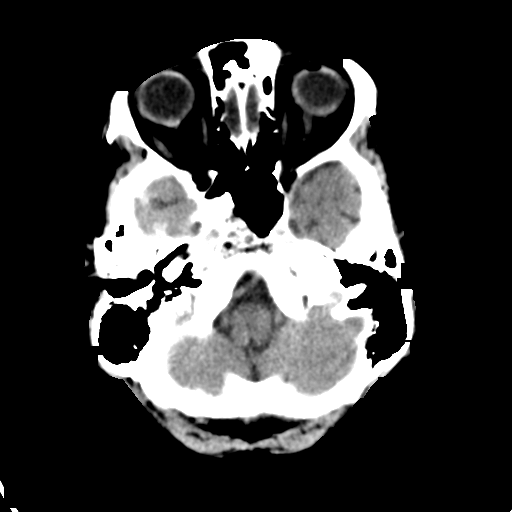
[im 4/30  bone]
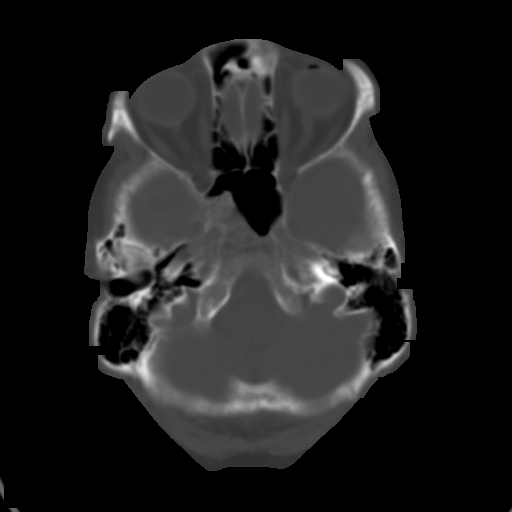
[im 8/30  brain]
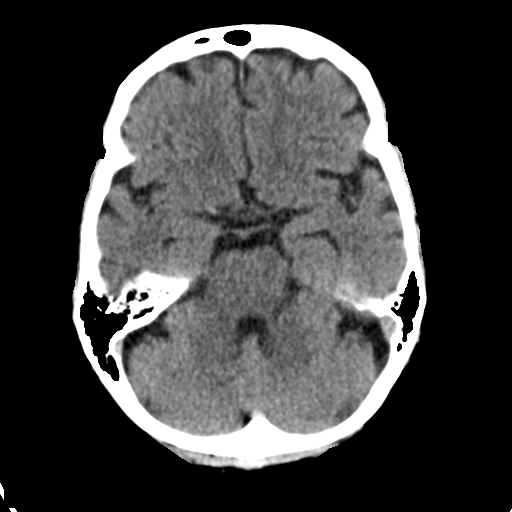
[im 11/30  brain]
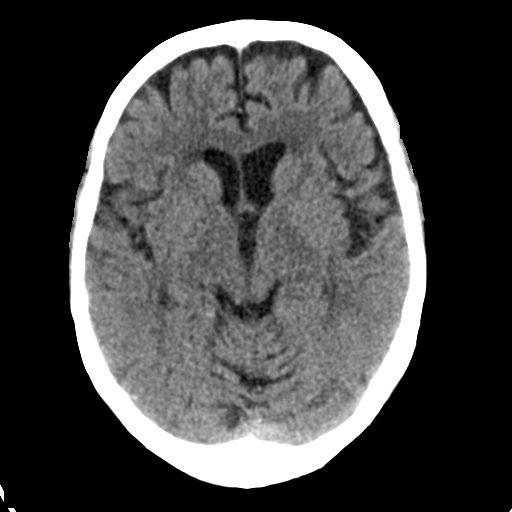
[im 15/30  brain]
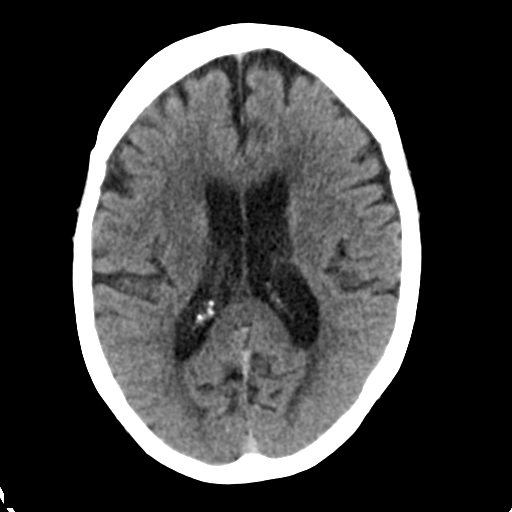
[im 19/30  brain]
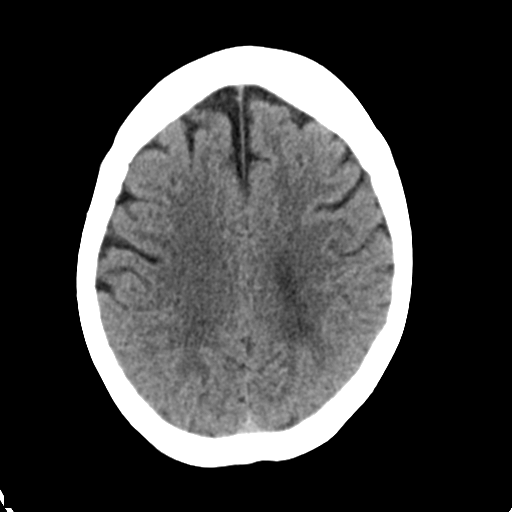
[im 19/30  bone]
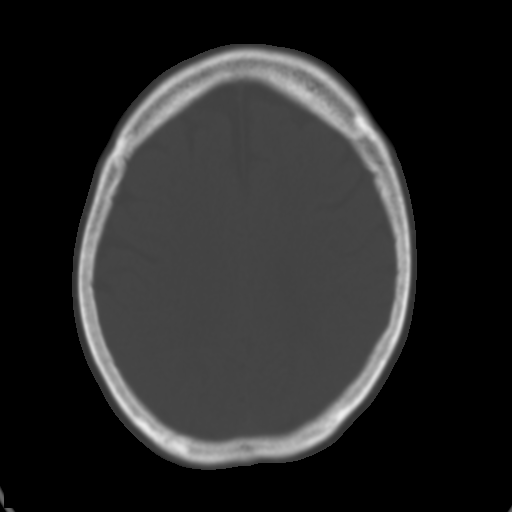
[im 22/30  brain]
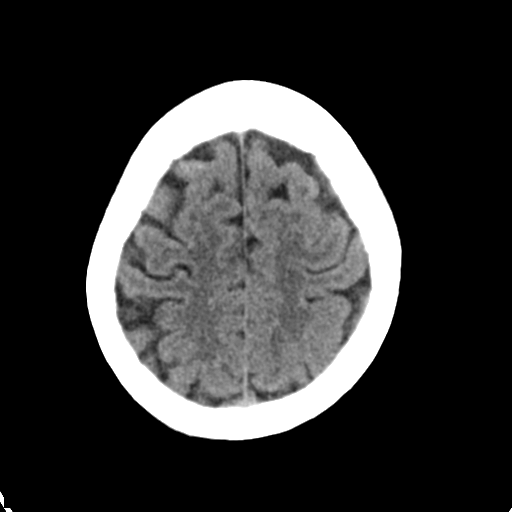
[im 26/30  brain]
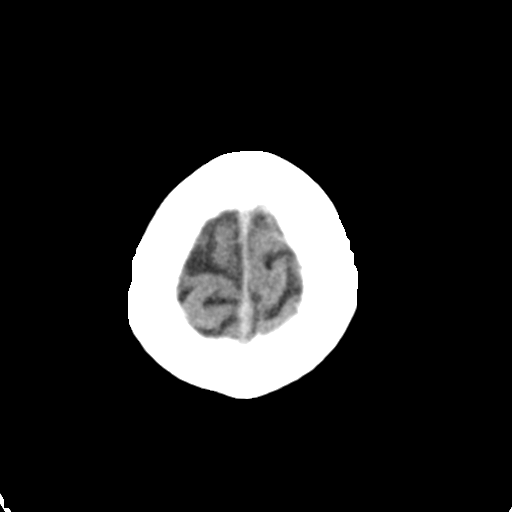

[Series 4: head bone · axial · 0.39mm/px · z∈[-98,-68]mm · 3 of 75 slices shown]
[im 8/75  bone]
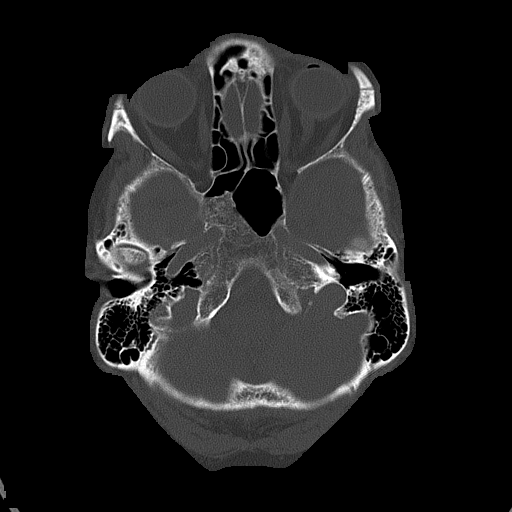
[im 15/75  bone]
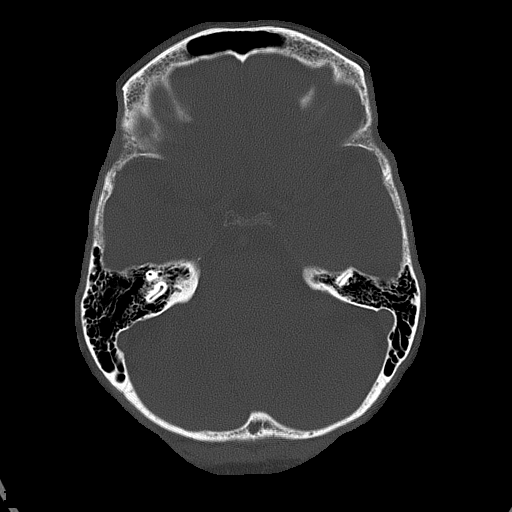
[im 23/75  bone]
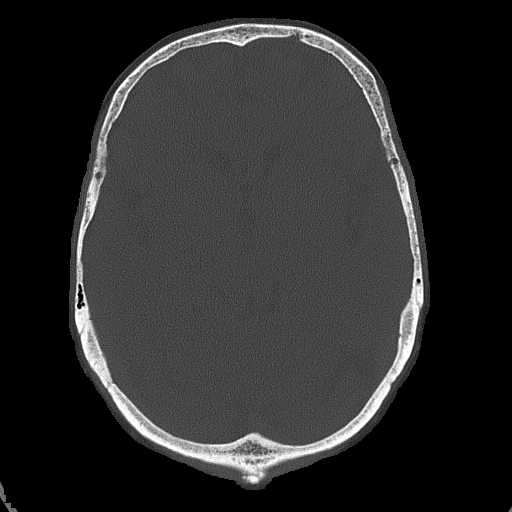

[Series 5: head without cor · coronal · non-contrast · 0.29mm/px · 3 of 67 slices shown]
[im 23/67  brain]
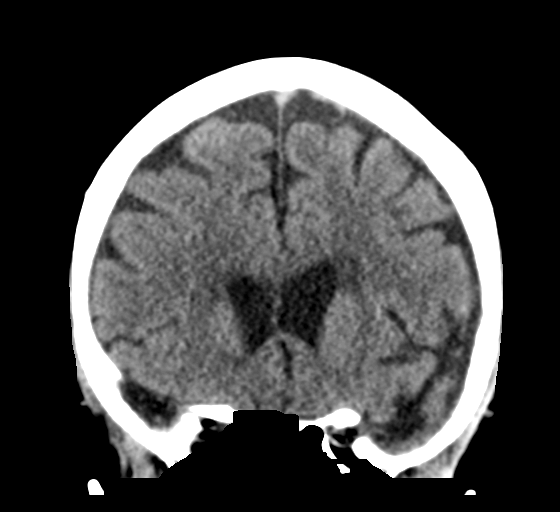
[im 30/67  brain]
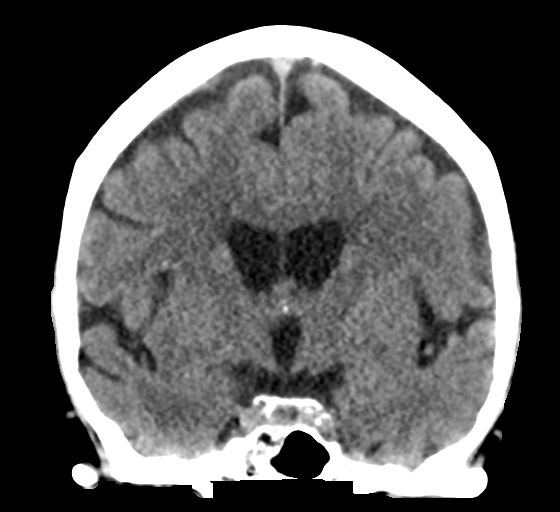
[im 37/67  brain]
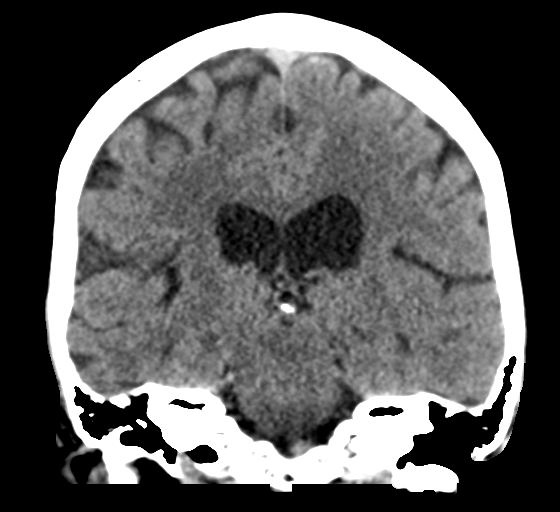

[Series 6: head without sag · sagittal · non-contrast · 0.29mm/px · 3 of 52 slices shown]
[im 18/52  brain]
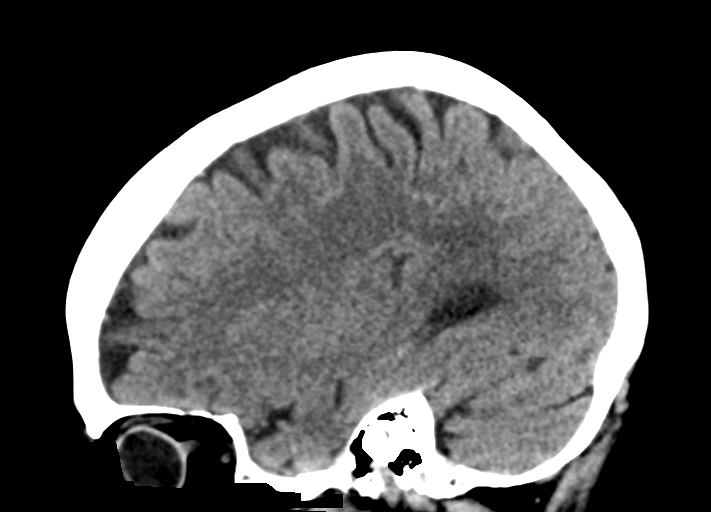
[im 26/52  brain]
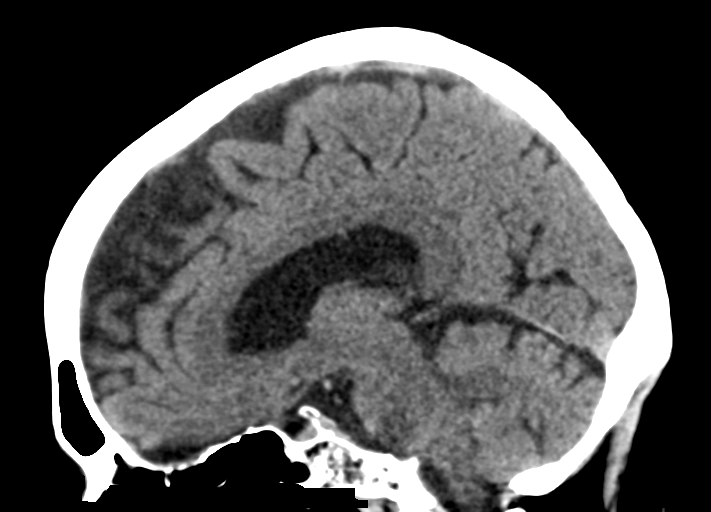
[im 35/52  brain]
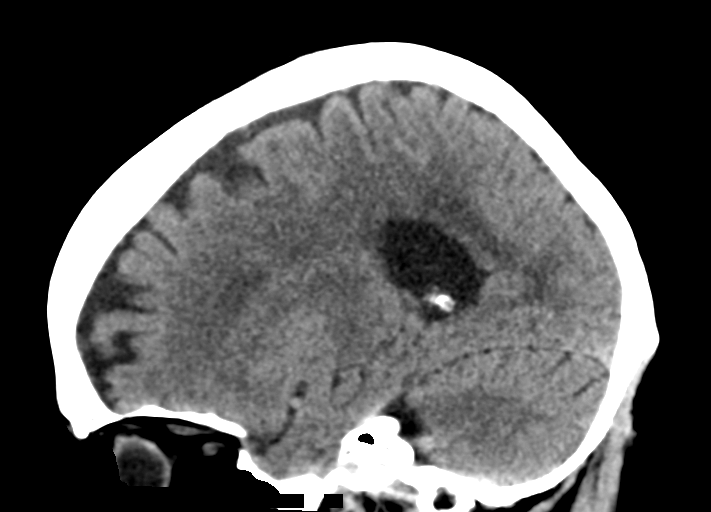

[16 of 47 positions shown; findings below may reference images not displayed]

FINDINGS: Brain: Normal appearance of the brain for age. Mild volume loss.
Mild chronic small-vessel change of the deep white matter. No sign
of acute infarction, mass lesion, hemorrhage, hydrocephalus or
extra-axial collection.

Vascular: There is atherosclerotic calcification of the major
vessels at the base of the brain.

Skull: Probable region of fibrous dysplasia in the clivus, not
clinically significant.

Sinuses/Orbits: Sinuses are clear. No orbital lesion by CT.

Other: None
IMPRESSION: No acute finding by CT. Mild age related atrophy and chronic
small-vessel ischemic change of the deep white matter.
Atherosclerotic calcification of the major vessels at the base of
the brain.

## 2021-10-07 IMAGING — CT CT ANGIO NECK
2 of 7 series · 8 of 33 positions shown · IV contrast (omnipaque)
Comparison: None.

CLINICAL DATA: Right eye vision loss

EXAM:
CT ANGIOGRAPHY HEAD AND NECK
TECHNIQUE: Multidetector CT imaging of the head and neck was performed using
the standard protocol during bolus administration of intravenous
contrast. Multiplanar CT image reconstructions and MIPs were
obtained to evaluate the vascular anatomy. Carotid stenosis
measurements (when applicable) are obtained utilizing NASCET
criteria, using the distal internal carotid diameter as the
denominator.
CONTRAST:  75mL OMNIPAQUE IOHEXOL 350 MG/ML SOLN

[Series 5: cta neck/head · axial · 0.59mm/px · z∈[+1050,+1162]mm · 2 of 170 slices shown]
[im 57/170  soft-tissue]
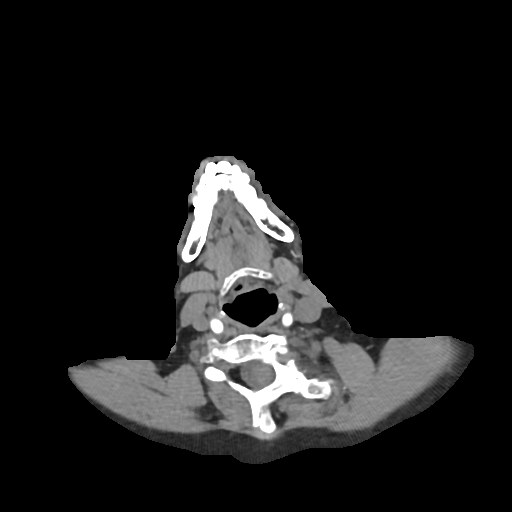
[im 113/170  soft-tissue]
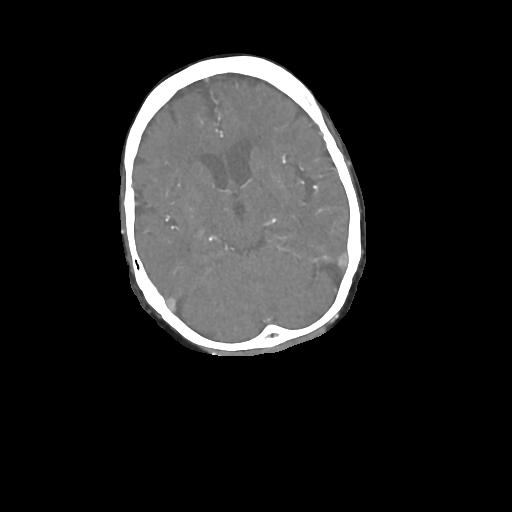

[Series 7: ax thins · axial · 0.39mm/px · z∈[+992,+1235]mm · 6 of 341 slices shown]
[im 49/341  soft-tissue]
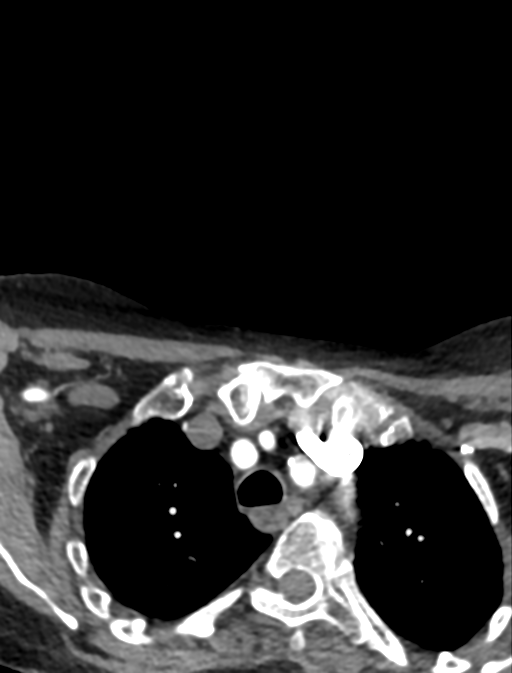
[im 98/341  bone]
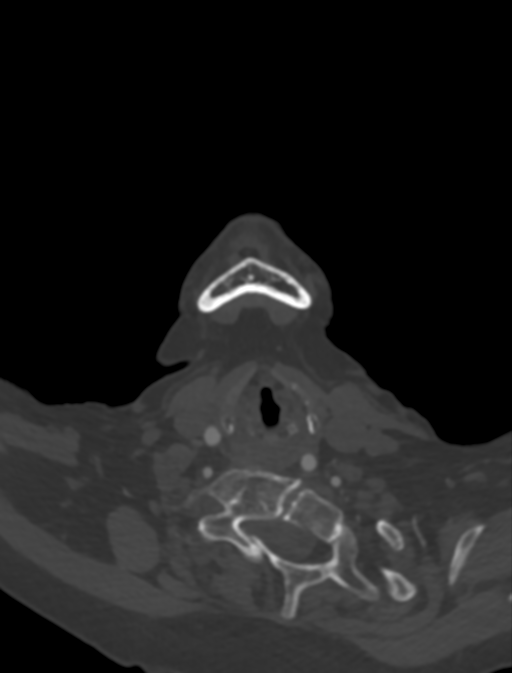
[im 146/341  soft-tissue]
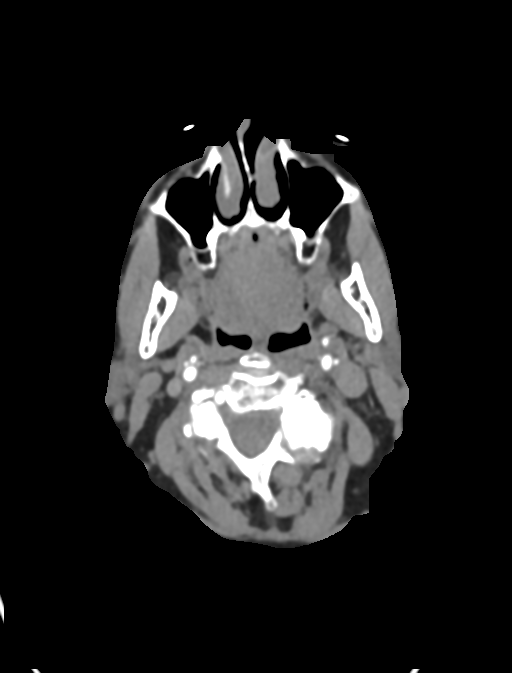
[im 195/341  bone]
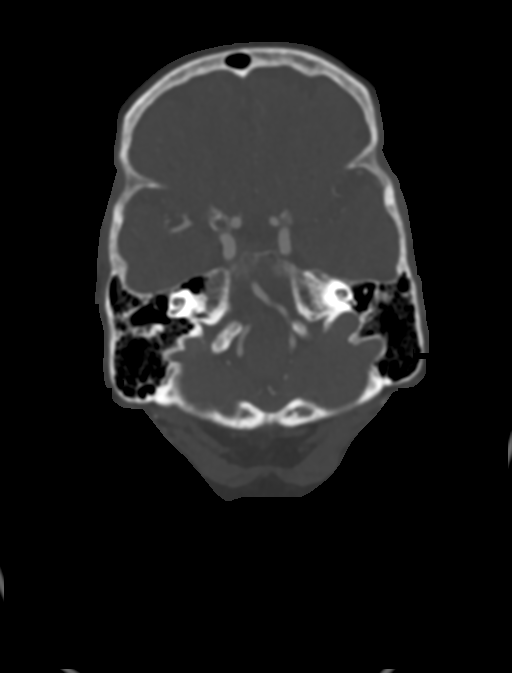
[im 243/341  soft-tissue]
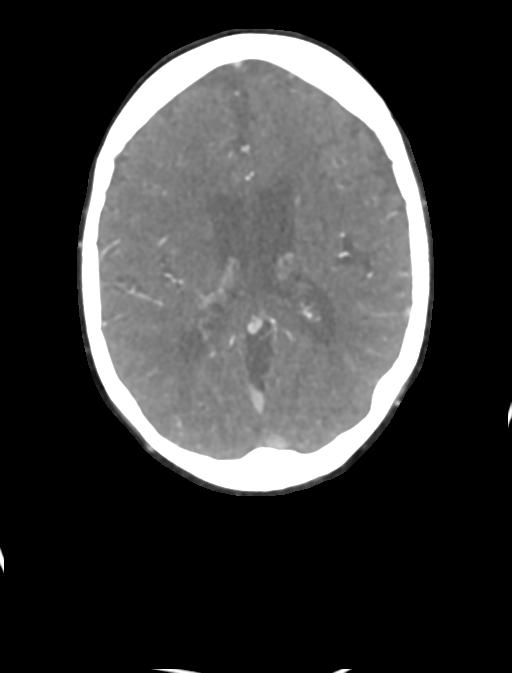
[im 292/341  bone]
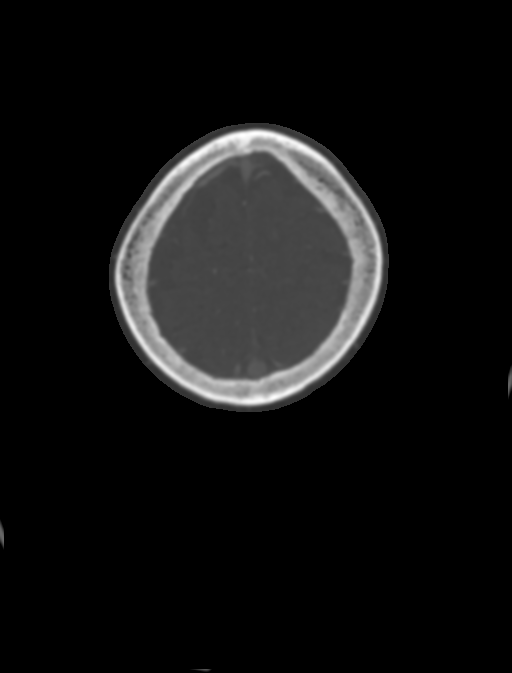

[8 of 33 positions shown; findings below may reference images not displayed]

FINDINGS: CTA NECK FINDINGS

SKELETON: Grade 1 C4-5 anterolisthesis. Cervicothoracic scoliosis.

OTHER NECK: Normal pharynx, larynx and major salivary glands. No
cervical lymphadenopathy. Unremarkable thyroid gland.

UPPER CHEST: No pneumothorax or pleural effusion. No nodules or
masses.

AORTIC ARCH:

There is mild calcific atherosclerosis of the aortic arch. There is
no aneurysm, dissection or hemodynamically significant stenosis of
the visualized portion of the aorta. Conventional 3 vessel aortic
branching pattern. The visualized proximal subclavian arteries are
widely patent.

RIGHT CAROTID SYSTEM: Normal without aneurysm, dissection or
stenosis.

LEFT CAROTID SYSTEM: Normal without aneurysm, dissection or
stenosis.

VERTEBRAL ARTERIES: Codominant configuration. Both origins are
clearly patent. There is no dissection, occlusion or flow-limiting
stenosis to the skull base (V1-V3 segments).

CTA HEAD FINDINGS

POSTERIOR CIRCULATION:

--Vertebral arteries: Normal V4 segments.

--Posterior inferior cerebellar arteries (PICA): Patent origins from
the vertebral arteries.

--Anterior inferior cerebellar arteries (AICA): Not clearly
visualized, though this is not uncommon.

--Basilar artery: Normal.

--Superior cerebellar arteries: Normal.

--Posterior cerebral arteries: Normal. Both originate from the
basilar artery. Posterior communicating arteries (p-comm) are
diminutive or absent.

ANTERIOR CIRCULATION:

--Intracranial internal carotid arteries: Normal.

--Anterior cerebral arteries (ACA): Normal. Both A1 segments are
present. Patent anterior communicating artery (a-comm).

--Middle cerebral arteries (MCA): Normal.

VENOUS SINUSES: As permitted by contrast timing, patent.

ANATOMIC VARIANTS: None

Review of the MIP images confirms the above findings.
IMPRESSION: No intracranial or cervical arterial occlusion or high-grade
stenosis.
